# Patient Record
Sex: Female | Born: 1960
Health system: Southern US, Community
[De-identification: ages and names within clinical notes are randomized; demographics above are authoritative.]

## PROBLEM LIST (undated history)

## (undated) DIAGNOSIS — K5190 Ulcerative colitis, unspecified, without complications: Secondary | ICD-10-CM

## (undated) DIAGNOSIS — K219 Gastro-esophageal reflux disease without esophagitis: Secondary | ICD-10-CM

## (undated) DIAGNOSIS — N951 Menopausal and female climacteric states: Secondary | ICD-10-CM

## (undated) DIAGNOSIS — M1990 Unspecified osteoarthritis, unspecified site: Secondary | ICD-10-CM

## (undated) DIAGNOSIS — R011 Cardiac murmur, unspecified: Secondary | ICD-10-CM

## (undated) DIAGNOSIS — R350 Frequency of micturition: Secondary | ICD-10-CM

## (undated) DIAGNOSIS — J189 Pneumonia, unspecified organism: Secondary | ICD-10-CM

## (undated) DIAGNOSIS — I10 Essential (primary) hypertension: Secondary | ICD-10-CM

## (undated) DIAGNOSIS — F329 Major depressive disorder, single episode, unspecified: Secondary | ICD-10-CM

## (undated) DIAGNOSIS — N950 Postmenopausal bleeding: Secondary | ICD-10-CM

## (undated) DIAGNOSIS — R3915 Urgency of urination: Secondary | ICD-10-CM

## (undated) DIAGNOSIS — O24419 Gestational diabetes mellitus in pregnancy, unspecified control: Secondary | ICD-10-CM

## (undated) DIAGNOSIS — B070 Plantar wart: Secondary | ICD-10-CM

## (undated) DIAGNOSIS — R112 Nausea with vomiting, unspecified: Secondary | ICD-10-CM

## (undated) DIAGNOSIS — E669 Obesity, unspecified: Secondary | ICD-10-CM

## (undated) DIAGNOSIS — F32A Depression, unspecified: Secondary | ICD-10-CM

## (undated) DIAGNOSIS — M1710 Unilateral primary osteoarthritis, unspecified knee: Secondary | ICD-10-CM

## (undated) DIAGNOSIS — R040 Epistaxis: Secondary | ICD-10-CM

## (undated) DIAGNOSIS — E785 Hyperlipidemia, unspecified: Secondary | ICD-10-CM

## (undated) DIAGNOSIS — R42 Dizziness and giddiness: Secondary | ICD-10-CM

## (undated) DIAGNOSIS — M179 Osteoarthritis of knee, unspecified: Secondary | ICD-10-CM

## (undated) DIAGNOSIS — H7291 Unspecified perforation of tympanic membrane, right ear: Secondary | ICD-10-CM

## (undated) DIAGNOSIS — E113499 Type 2 diabetes mellitus with severe nonproliferative diabetic retinopathy without macular edema, unspecified eye: Secondary | ICD-10-CM

## (undated) DIAGNOSIS — D62 Acute posthemorrhagic anemia: Secondary | ICD-10-CM

## (undated) DIAGNOSIS — G459 Transient cerebral ischemic attack, unspecified: Secondary | ICD-10-CM

## (undated) DIAGNOSIS — Z9889 Other specified postprocedural states: Secondary | ICD-10-CM

## (undated) HISTORY — DX: Obesity, unspecified: E66.9

## (undated) HISTORY — PX: MOUTH SURGERY: SHX715

## (undated) HISTORY — DX: Unspecified osteoarthritis, unspecified site: M19.90

## (undated) HISTORY — DX: Menopausal and female climacteric states: N95.1

## (undated) HISTORY — PX: OTHER SURGICAL HISTORY: SHX169

## (undated) HISTORY — DX: Major depressive disorder, single episode, unspecified: F32.9

## (undated) HISTORY — PX: COLONOSCOPY: SHX174

## (undated) HISTORY — DX: Type 2 diabetes mellitus with severe nonproliferative diabetic retinopathy without macular edema, unspecified eye: E11.3499

## (undated) HISTORY — PX: BREAST BIOPSY: SHX20

## (undated) HISTORY — DX: Depression, unspecified: F32.A

## (undated) HISTORY — DX: Transient cerebral ischemic attack, unspecified: G45.9

## (undated) HISTORY — DX: Essential (primary) hypertension: I10

## (undated) HISTORY — DX: Gestational diabetes mellitus in pregnancy, unspecified control: O24.419

---
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
## 1998-07-16 ENCOUNTER — Encounter: Admission: RE | Admit: 1998-07-16 | Discharge: 1998-10-14 | Payer: Self-pay | Admitting: Neurology

## 2003-08-25 HISTORY — PX: KNEE ARTHROSCOPY: SUR90

## 2011-08-04 ENCOUNTER — Other Ambulatory Visit: Payer: Self-pay | Admitting: Orthopedic Surgery

## 2011-08-10 ENCOUNTER — Encounter: Payer: Self-pay | Admitting: Pulmonary Disease

## 2011-08-11 ENCOUNTER — Institutional Professional Consult (permissible substitution): Payer: Self-pay | Admitting: Pulmonary Disease

## 2011-09-21 ENCOUNTER — Telehealth: Payer: Self-pay | Admitting: Internal Medicine

## 2011-09-21 NOTE — Telephone Encounter (Signed)
Pt called in wanting to start care with [facility]; she would like to have her complete physical done on that first visit. She was advise that on the first visit is usually a history visit, but I told her I will pass her name and number will be pass to our nurse. The nurse will then call you, talk with you and see you can have your physical on the first day. She spoke understanding; she took the 10/13/2011 945 am visit. Pt number is [phone]. Thanks

## 2011-09-21 NOTE — Telephone Encounter (Signed)
Spoke with [name].  She states she is scheduled for a knee replacement, needs to have CPE done prior to for surgical clearance.  Reports other than migraine headaches that she sees [facility] for, no other major medical problems.  Okay for CPE on first visit.  She is aware she will have EKG and labs likely for surgical clearance.  She is agreeable

## 2011-09-21 NOTE — Telephone Encounter (Signed)
Attempted to contact pt, voicemail not set up yet, unable to leave a message

## 2011-10-13 ENCOUNTER — Encounter: Payer: Self-pay | Admitting: Internal Medicine

## 2011-10-13 ENCOUNTER — Ambulatory Visit (INDEPENDENT_AMBULATORY_CARE_PROVIDER_SITE_OTHER): Payer: 59 | Admitting: Internal Medicine

## 2011-10-13 VITALS — BP 136/86 | HR 93 | Temp 97.7°F | Resp 12 | Ht 67.0 in | Wt 208.1 lb

## 2011-10-13 DIAGNOSIS — M1990 Unspecified osteoarthritis, unspecified site: Secondary | ICD-10-CM | POA: Insufficient documentation

## 2011-10-13 DIAGNOSIS — F32A Depression, unspecified: Secondary | ICD-10-CM | POA: Insufficient documentation

## 2011-10-13 DIAGNOSIS — F329 Major depressive disorder, single episode, unspecified: Secondary | ICD-10-CM | POA: Insufficient documentation

## 2011-10-13 DIAGNOSIS — Z0000 Encounter for general adult medical examination without abnormal findings: Secondary | ICD-10-CM

## 2011-10-13 DIAGNOSIS — L509 Urticaria, unspecified: Secondary | ICD-10-CM | POA: Insufficient documentation

## 2011-10-13 DIAGNOSIS — M129 Arthropathy, unspecified: Secondary | ICD-10-CM

## 2011-10-13 DIAGNOSIS — K219 Gastro-esophageal reflux disease without esophagitis: Secondary | ICD-10-CM

## 2011-10-13 DIAGNOSIS — R6889 Other general symptoms and signs: Secondary | ICD-10-CM

## 2011-10-13 DIAGNOSIS — IMO0002 Reserved for concepts with insufficient information to code with codable children: Secondary | ICD-10-CM

## 2011-10-13 DIAGNOSIS — R51 Headache: Secondary | ICD-10-CM

## 2011-10-13 DIAGNOSIS — R011 Cardiac murmur, unspecified: Secondary | ICD-10-CM

## 2011-10-13 DIAGNOSIS — N951 Menopausal and female climacteric states: Secondary | ICD-10-CM | POA: Insufficient documentation

## 2011-10-13 DIAGNOSIS — R87619 Unspecified abnormal cytological findings in specimens from cervix uteri: Secondary | ICD-10-CM | POA: Insufficient documentation

## 2011-10-13 DIAGNOSIS — G43909 Migraine, unspecified, not intractable, without status migrainosus: Secondary | ICD-10-CM | POA: Insufficient documentation

## 2011-10-13 LAB — POCT URINALYSIS DIPSTICK
Glucose, UA: NEGATIVE
Leukocytes, UA: NEGATIVE
Nitrite, UA: NEGATIVE
Protein, UA: NEGATIVE
Spec Grav, UA: <=1.005
Urobilinogen, UA: NEGATIVE

## 2011-10-13 LAB — LIPID PANEL
Cholesterol: 192 mg/dL (ref 0–200)
Total CHOL/HDL Ratio: 3.3 Ratio
Triglycerides: 95 mg/dL (ref <150)
VLDL: 19 mg/dL (ref 0–40)

## 2011-10-13 LAB — COMPREHENSIVE METABOLIC PANEL
ALT: 12 U/L (ref 0–35)
AST: 16 U/L (ref 0–37)
CO2: 19 mEq/L (ref 19–32)
Calcium: 9.6 mg/dL (ref 8.4–10.5)
Chloride: 106 mEq/L (ref 96–112)
Creat: 1.08 mg/dL (ref 0.50–1.10)
Potassium: 4.4 mEq/L (ref 3.5–5.3)
Sodium: 139 mEq/L (ref 135–145)
Total Protein: 7.2 g/dL (ref 6.0–8.3)

## 2011-10-13 MED ORDER — AZITHROMYCIN 250 MG PO TABS: ORAL_TABLET | ORAL | Status: AC

## 2011-10-13 NOTE — Patient Instructions (Signed)
To have ECHO cardiogram done  Labs will be mailed to you  See me in early march

## 2011-10-13 NOTE — Progress Notes (Signed)
Subjective:    Patient ID: [name], female    DOB: 05-Aug-1961, 51 y.o.   MRN: [id number]  HPI New pt here for first visit.  PMH of migraine headaches, Advanced DJD of knees, Depression, remote abnormal pap treated with lazer, GERD, chronic urticaria and cardiac murmur.  She has not had any primary care in years.    She willl be having knee replacement surgery in early March.  Dr. Maureen Ralphs   She has been having pain for several days in her R ear  No drainage no fever  Was told by her headache doctor she had a heart murmur  No chest pain no Sob no palpitations.  Did no have ECHO but was told EKG was ok   Allergies  Allergen Reactions  . Codeine Nausea And Vomiting   Past Medical History  Diagnosis Date  . Migraine without aura   . Gestational diabetes   . Menopausal syndrome   . Obese   . Anemia   . Arthritis   . Depression   . Hypertension    Past Surgical History  Procedure Date  . Tonsillectomy 1979  . Knee arthroscopy 2005   History   Social History  . Marital Status: Single    Spouse Name: N/A    Number of Children: N/A  . Years of Education: N/A   Occupational History  . Not on file.   Social History Main Topics  . Smoking status: Never Smoker   . Smokeless tobacco: Not on file  . Alcohol Use: 4.2 oz/week    7 Glasses of wine per week  . Drug Use: No  . Sexually Active: Not Currently   Other Topics Concern  . Not on file   Social History Narrative  . No narrative on file   Family History  Problem Relation Age of Onset  . Heart disease Mother     mitral valve prolapse  . Lung cancer Father   . Thyroid disease Father   . Thyroid disease Sister   . Heart disease Maternal Aunt   . Heart disease Maternal Uncle   . Thyroid disease Paternal Aunt   . Heart disease Maternal Grandfather   . Heart disease Paternal Grandfather   . Heart disease Maternal Uncle   . Heart disease Maternal Uncle   . Heart disease Maternal Uncle   . Diabetes Maternal  Aunt    There is no problem list on file for this patient.  Current Outpatient Prescriptions on File Prior to Visit  Medication Sig Dispense Refill  . HYDROcodone-acetaminophen (NORCO) 10-325 MG per tablet 1-2 tablets BID PRN      . topiramate (TOPAMAX) 100 MG tablet Take 150 mg by mouth at bedtime.       Marland Kitchen venlafaxine (EFFEXOR-XR) 75 MG 24 hr capsule Take 225 mg by mouth daily.            Review of Systems See HPI    Objective:   Physical Exam Physical Exam  Nursing note and vitals reviewed.  Constitutional: She is oriented to person, place, and time. She appears well-developed and well-nourished.  HENT:  Head: Normocephalic and atraumatic.  L ear TM clear  R ear TM red swollen Cardiovascular: Normal rate and regular rhythm. Exam reveals no gallop and no friction rub.   Very soft 2/6 SEM LUSB No murmur heard.  Pulmonary/Chest: Breath sounds normal. She has no wheezes. She has no rales.  Neurological: She is alert and oriented to person, place, and time.  Skin: Skin is warm and dry.  Psychiatric: She has a normal mood and affect. Her behavior is normal.              Assessment & Plan:  1)  Cardiac murmur:  EKG  Bicuspid p wave V1   Other wise normal  Will get @-D echo 2)  R otitits media  Will give Z pak 3)  Elevated BP  Will recheck in 3 weeks.  SBP came down when I checked myself 4)  Advanced DJD   Pre-op approval pending  ECHO and labs 5)  Migraine headache:  Dr. Domingo Cocking on Topamax 6)  GERD 7)  Remote abnormal pap smear.  Dr. Posey Pronto 8)  Situational depression  See me in 2-3 weeks

## 2011-10-14 LAB — CBC WITH DIFFERENTIAL/PLATELET
Basophils Absolute: 0 10*3/uL (ref 0.0–0.1)
Lymphocytes Relative: 33 % (ref 12–46)
Lymphs Abs: 1.9 10*3/uL (ref 0.7–4.0)
MCV: 93.3 fL (ref 78.0–100.0)
Neutro Abs: 3.3 10*3/uL (ref 1.7–7.7)
Neutrophils Relative %: 57 % (ref 43–77)
Platelets: 303 10*3/uL (ref 150–400)
RBC: 4.51 MIL/uL (ref 3.87–5.11)
RDW: 13.1 % (ref 11.5–15.5)
WBC: 5.8 10*3/uL (ref 4.0–10.5)

## 2011-10-15 ENCOUNTER — Encounter: Payer: Self-pay | Admitting: Emergency Medicine

## 2011-10-15 NOTE — Progress Notes (Signed)
H&P performed 10/15/2011 Dictation # [id number]

## 2011-10-16 NOTE — H&P (Signed)
[name], [name]                ACCOUNT NO.:  [id number]  MEDICAL RECORD NO.:  [id number]  LOCATION:  [place]                           FACILITY:  [facility]  PHYSICIAN:  Gaynelle Arabian, M.D.    DATE OF BIRTH:  1960/09/12  DATE OF ADMISSION:  10/13/2011 DATE OF DISCHARGE:                             HISTORY & PHYSICAL   DATE OF SURGERY:  November 02, 2011.  ADMITTING DIAGNOSIS:  End-stage osteoarthritis of the left knee.  HISTORY OF PRESENT ILLNESS:  This is a 51 year old lady who has been having problems with her left knee for years, has had arthroscopy, viscosupplementation and has failed conservative management.  After discussion of treatments, benefits, risks, and options, the patient is now scheduled for total knee arthroplasty of the left knee.  Note that her medical doctor is Dr. Emi Belfast at [facility].  Her headache doctor is Dr. Domingo Cocking.  She plans on going home after surgery, but if doing poorly in therapy may request to go to a rehab facility.  We will see as the postoperative period goes on.  PAST MEDICAL HISTORY:  Drug allergy to CODEINE with nausea and vomiting and ONIONS with migraine headache.  CURRENT MEDICATIONS: 1. Effexor 225 mg daily. 2. Topamax 150 mg daily. 3. Omeprazole 20 mg daily. 4. Zyrtec 1 daily. 5. Aleve 2 daily, which she will stop 5 days before surgery. 6. Hydrocodone 5/325 1 daily p.r.n. 7. Relpax p.r.n. for migraines.  SERIOUS MEDICAL ILLNESSES:  Include: 1. Migraines. 2. Situational depression. 3. Reflux. 4. Allergies.  PREVIOUS SURGERIES:  Include: 1. Knee arthroscopy. 2. Tonsillectomy. 3. Excision of an osteophyte from anterior skull from trauma as a     child.  FAMILY HISTORY:  Positive for lung cancer, heart attack, coronary artery disease, anxiety, and thyroid disease.  SOCIAL HISTORY:  The patient is divorced.  She works as a Education officer, community in a counseling group.  She does not smoke and drinks several glasses of wine  per week.  She does live alone and is planning on going home with her mother for the first 2 weeks after surgery and then with her daughter thereafter.  REVIEW OF SYSTEMS:  CENTRAL NERVOUS SYSTEM:  Positive for fatigue and insomnia, also for migraine headaches and some hearing loss in the right ear.  PULMONARY:  Negative for shortness of breath, PND, and orthopnea. CARDIOVASCULAR:  Positive for a murmur.  She is scheduled for an echo on October 20, 2011.  Negative for chest pains or palpitation.  GI: Negative for ulcers or hepatitis, positive for reflux.  GU:  Negative for urinary tract difficulties other than urinary frequency. MUSCULOSKELETAL:  Positives in HPI.  Note that the patient also has intermittent hives for which she takes Zyrtec.  PHYSICAL EXAM:  VITAL SIGNS:  BP 140/88, pulse 72 and regular, respirations 14. HEENT:  HEAD normocephalic.  Nose patent, ears patent.  Pupils equal, round, react to light.  Throat without injection. NECK:  Supple without adenopathy.  Carotids 2+ without bruit. CHEST:  Clear to auscultation.  No rales or rhonchi.  Respirations 14. HEART:  Regular rate and rhythm at 72 beats per minute without murmur. ABDOMEN:  Soft with active  bowel sounds.  No masses or organomegaly. NEUROLOGIC:  Patient alert and oriented to time, place, and person. Cranial nerves II through XII grossly intact.  EXTREMITIES:  Show the left knee with a 5-degree flexion contracture, further flexion to 110 degrees with pain.  Dorsalis pedis, posterior tibialis pulses are 2+. She does have a varus deformity.  IMPRESSION:  End-stage osteoarthritis, left knee.  PLAN:  Total knee arthroplasty, left knee.     [name], P.A.   ______________________________ Gaynelle Arabian, M.D.    SJC/MEDQ  D:  10/15/2011  T:  10/16/2011  Job:  [id number]

## 2011-10-20 ENCOUNTER — Other Ambulatory Visit (HOSPITAL_COMMUNITY): Payer: Self-pay | Admitting: Radiology

## 2011-10-20 ENCOUNTER — Ambulatory Visit (HOSPITAL_COMMUNITY): Payer: 59 | Attending: Cardiovascular Disease

## 2011-10-20 ENCOUNTER — Other Ambulatory Visit: Payer: Self-pay

## 2011-10-20 DIAGNOSIS — I079 Rheumatic tricuspid valve disease, unspecified: Secondary | ICD-10-CM | POA: Insufficient documentation

## 2011-10-20 DIAGNOSIS — R51 Headache: Secondary | ICD-10-CM | POA: Insufficient documentation

## 2011-10-20 DIAGNOSIS — R011 Cardiac murmur, unspecified: Secondary | ICD-10-CM | POA: Insufficient documentation

## 2011-10-20 DIAGNOSIS — I059 Rheumatic mitral valve disease, unspecified: Secondary | ICD-10-CM | POA: Insufficient documentation

## 2011-10-22 ENCOUNTER — Encounter (HOSPITAL_COMMUNITY): Payer: Self-pay | Admitting: Pharmacy Technician

## 2011-10-23 ENCOUNTER — Encounter (HOSPITAL_COMMUNITY): Payer: Self-pay

## 2011-10-23 ENCOUNTER — Ambulatory Visit (HOSPITAL_COMMUNITY)
Admission: RE | Admit: 2011-10-23 | Discharge: 2011-10-23 | Disposition: A | Discharge status: Home / Self Care | Payer: 59 | Source: Ambulatory Visit | Attending: Orthopedic Surgery | Admitting: Orthopedic Surgery

## 2011-10-23 ENCOUNTER — Encounter (HOSPITAL_COMMUNITY)
Admission: RE | Admit: 2011-10-23 | Discharge: 2011-10-23 | Disposition: A | Discharge status: Home / Self Care | Payer: 59 | Source: Ambulatory Visit | Attending: Orthopedic Surgery | Admitting: Orthopedic Surgery

## 2011-10-23 DIAGNOSIS — Z01818 Encounter for other preprocedural examination: Secondary | ICD-10-CM | POA: Insufficient documentation

## 2011-10-23 DIAGNOSIS — Z01812 Encounter for preprocedural laboratory examination: Secondary | ICD-10-CM | POA: Insufficient documentation

## 2011-10-23 HISTORY — DX: Gastro-esophageal reflux disease without esophagitis: K21.9

## 2011-10-23 HISTORY — DX: Plantar wart: B07.0

## 2011-10-23 HISTORY — DX: Other specified postprocedural states: R11.2

## 2011-10-23 HISTORY — DX: Cardiac murmur, unspecified: R01.1

## 2011-10-23 HISTORY — DX: Other specified postprocedural states: Z98.890

## 2011-10-23 LAB — COMPREHENSIVE METABOLIC PANEL
ALT: 14 U/L (ref 0–35)
AST: 16 U/L (ref 0–37)
Albumin: 4.3 g/dL (ref 3.5–5.2)
Alkaline Phosphatase: 74 U/L (ref 39–117)
CO2: 26 mEq/L (ref 19–32)
Chloride: 105 mEq/L (ref 96–112)
Potassium: 4.1 mEq/L (ref 3.5–5.1)
Total Bilirubin: 0.3 mg/dL (ref 0.3–1.2)

## 2011-10-23 LAB — SURGICAL PCR SCREEN: Staphylococcus aureus: POSITIVE — AB

## 2011-10-23 LAB — CBC
Platelets: 294 10*3/uL (ref 150–400)
RBC: 4.64 MIL/uL (ref 3.87–5.11)
RDW: 13.2 % (ref 11.5–15.5)
WBC: 4.3 10*3/uL (ref 4.0–10.5)

## 2011-10-23 LAB — URINALYSIS, ROUTINE W REFLEX MICROSCOPIC
Bilirubin Urine: NEGATIVE
Glucose, UA: NEGATIVE mg/dL
Hgb urine dipstick: NEGATIVE
Nitrite: NEGATIVE
Specific Gravity, Urine: 1.022 (ref 1.005–1.030)
pH: 6 (ref 5.0–8.0)

## 2011-10-23 LAB — APTT: aPTT: 30 seconds (ref 24–37)

## 2011-10-23 NOTE — Pre-Procedure Instructions (Signed)
Abnormal labs called to Dr. Anne Fu office, spoke to [place] at his office who sent him a message via email .

## 2011-10-23 NOTE — Patient Instructions (Addendum)
[place]  10/23/2011   Your procedure is scheduled on:  11/02/11   Report to [place] at [phone] AM.  Call this number if you have problems the morning of surgery: [phone]   Remember:   Do not eat food:After Midnight.  May have clear liquids:until Midnight .  Clear liquids include soda, tea, black coffee, apple or grape juice, broth.  Take these medicines the morning of surgery with A SIP OF WATER: Zyrtec, Effexor, Prilosec    Do not wear jewelry, make-up or nail polish.  Do not wear lotions, powders, or perfumes.   Do not shave 48 hours prior to surgery.  Do not bring valuables to the hospital.  Contacts, dentures or bridgework may not be worn into surgery.  Leave suitcase in the car. After surgery it may be brought to your room.  For patients admitted to the hospital, checkout time is 11:00 AM the day of discharge.     Special Instructions: CHG Shower Use Special Wash: 1/2 bottle night before surgery and 1/2 bottle morning of surgery. shower chin to toes  With CHG.  Wash face and private parts with regular soap.    Please read over the following fact sheets that you were given: MRSA Information, Blood Transfusion Fact Sheet, Incentive Spirometry, Apnea Info, coughing and deep breathing exercises, leg exercises

## 2011-10-27 ENCOUNTER — Encounter: Payer: Self-pay | Admitting: Internal Medicine

## 2011-10-27 ENCOUNTER — Ambulatory Visit (INDEPENDENT_AMBULATORY_CARE_PROVIDER_SITE_OTHER): Payer: 59 | Admitting: Internal Medicine

## 2011-10-27 VITALS — BP 128/84 | HR 89 | Temp 97.2°F | Ht 67.0 in | Wt 209.1 lb

## 2011-10-27 DIAGNOSIS — IMO0001 Reserved for inherently not codable concepts without codable children: Secondary | ICD-10-CM

## 2011-10-27 DIAGNOSIS — M1990 Unspecified osteoarthritis, unspecified site: Secondary | ICD-10-CM

## 2011-10-27 DIAGNOSIS — R011 Cardiac murmur, unspecified: Secondary | ICD-10-CM

## 2011-10-27 DIAGNOSIS — R030 Elevated blood-pressure reading, without diagnosis of hypertension: Secondary | ICD-10-CM

## 2011-10-27 NOTE — Progress Notes (Signed)
Subjective:    Patient ID: [name], female    DOB: [birth year], 51 y.o.   MRN: [id number]  HPI  Blossom is here to recheck her BP prior to surgery.  Doing fine.  No headache or chest pain.  See ECHO report.    Allergies  Allergen Reactions  . Codeine Nausea And Vomiting  . Onion Other (See Comments)    Headache    Past Medical History  Diagnosis Date  . Migraine without aura   . Menopausal syndrome   . Obese   . Arthritis   . Depression   . PONV (postoperative nausea and vomiting)   . Heart murmur     echo 10/20/2011  . GERD (gastroesophageal reflux disease)   . Plantar warts    Past Surgical History  Procedure Date  . Tonsillectomy 1979  . Knee arthroscopy 2005  . 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   History   Social History  . Marital Status: Single    Spouse Name: N/A    Number of Children: N/A  . Years of Education: N/A   Occupational History  . Not on file.   Social History Main Topics  . Smoking status: Never Smoker   . Smokeless tobacco: Not on file  . Alcohol Use: 4.2 oz/week    7 Glasses of wine per week     occassionally  . Drug Use: No  . Sexually Active: Not Currently   Other Topics Concern  . Not on file   Social History Narrative  . No narrative on file   Family History  Problem Relation Age of Onset  . Heart disease Mother     mitral valve prolapse  . Lung cancer Father   . Thyroid disease Father   . Thyroid disease Sister   . Heart disease Maternal Aunt   . Heart disease Maternal Uncle   . Thyroid disease Paternal Aunt   . Heart disease Maternal Grandfather   . Heart disease Paternal Grandfather   . Heart disease Maternal Uncle   . Heart disease Maternal Uncle   . Heart disease Maternal Uncle   . Diabetes Maternal Aunt    Patient Active Problem List  Diagnoses  . Arthritis  . Migraine without aura  . Depression  . Menopausal syndrome  . Abnormal Pap smear  . Cardiac murmur  . GERD  (gastroesophageal reflux disease)  . Urticaria   Current Outpatient Prescriptions on File Prior to Visit  Medication Sig Dispense Refill  . cetirizine (ZYRTEC) 10 MG tablet Take 10 mg by mouth daily.      . diphenhydramine-acetaminophen (TYLENOL PM) 25-500 MG TABS Take 2 tablets by mouth at bedtime.      Marland Kitchen eletriptan (RELPAX) 40 MG tablet One tablet by mouth as needed for migraine headache.  If the headache improves and then returns, dose may be repeated after 2 hours have elapsed since first dose (do not exceed 80 mg per day). may repeat in 2 hours if necessary. Headache       . HYDROcodone-acetaminophen (NORCO) 10-325 MG per tablet Take 1-2 tablets by mouth every 6 (six) hours as needed. Pain       . Melatonin 10 MG TABS Take 10 mg by mouth at bedtime.      . naproxen sodium (ANAPROX) 220 MG tablet Take 440 mg by mouth 2 (two) times daily as needed. Pain       . omeprazole (PRILOSEC OTC) 20 MG tablet  Take 20 mg by mouth daily.      Marland Kitchen topiramate (TOPAMAX) 100 MG tablet Take 150 mg by mouth at bedtime.       Marland Kitchen venlafaxine (EFFEXOR-XR) 75 MG 24 hr capsule Take 225 mg by mouth daily after breakfast.           Review of Systems see HPI   Objective:   Physical Exam  Physical Exam  Nursing note and vitals reviewed.  BP my exam WNL Constitutional: She is oriented to person, place, and time. She appears well-developed and well-nourished.  HENT:  Head: Normocephalic and atraumatic.  Cardiovascular: Normal rate and regular rhythm. Exam reveals no gallop and no friction rub.  No murmur heard.  Pulmonary/Chest: Breath sounds normal. She has no wheezes. She has no rales.  Neurological: She is alert and oriented to person, place, and time.  Skin: Skin is warm and dry.  Psychiatric: She has a normal mood and affect. Her behavior is normal.        Assessment & Plan:  1)  Elevated BP  Now normal 2)  Advance DJD  TKR next week 3)  History of Cardiac murmur.  ECHO  Valves look normal  Good  EF

## 2011-10-27 NOTE — Patient Instructions (Signed)
Office visit with me after surgery

## 2011-10-30 NOTE — Pre-Procedure Instructions (Signed)
Received call from Violet Baldy at Dr. Anne Fu office on 10-27-2011 for No action needed on abnormal labs per Arlee Muslim, PA

## 2011-11-02 ENCOUNTER — Ambulatory Visit (HOSPITAL_COMMUNITY): Payer: 59 | Admitting: Anesthesiology

## 2011-11-02 ENCOUNTER — Encounter (HOSPITAL_COMMUNITY)
Admission: RE | Disposition: A | Discharge status: Skilled Nursing Facility | Payer: Self-pay | Source: Ambulatory Visit | Attending: Orthopedic Surgery

## 2011-11-02 ENCOUNTER — Encounter (HOSPITAL_COMMUNITY): Payer: Self-pay

## 2011-11-02 ENCOUNTER — Inpatient Hospital Stay (HOSPITAL_COMMUNITY)
Admission: RE | Admit: 2011-11-02 | Discharge: 2011-11-05 | DRG: 470 | Disposition: A | Discharge status: Skilled Nursing Facility | Payer: 59 | Source: Ambulatory Visit | Attending: Orthopedic Surgery | Admitting: Orthopedic Surgery

## 2011-11-02 ENCOUNTER — Encounter (HOSPITAL_COMMUNITY): Payer: Self-pay | Admitting: Anesthesiology

## 2011-11-02 DIAGNOSIS — E871 Hypo-osmolality and hyponatremia: Secondary | ICD-10-CM | POA: Diagnosis not present

## 2011-11-02 DIAGNOSIS — M1710 Unilateral primary osteoarthritis, unspecified knee: Principal | ICD-10-CM | POA: Diagnosis present

## 2011-11-02 DIAGNOSIS — Z96659 Presence of unspecified artificial knee joint: Secondary | ICD-10-CM

## 2011-11-02 DIAGNOSIS — D62 Acute posthemorrhagic anemia: Secondary | ICD-10-CM | POA: Diagnosis not present

## 2011-11-02 DIAGNOSIS — E876 Hypokalemia: Secondary | ICD-10-CM | POA: Diagnosis not present

## 2011-11-02 DIAGNOSIS — E669 Obesity, unspecified: Secondary | ICD-10-CM | POA: Diagnosis present

## 2011-11-02 HISTORY — DX: Acute posthemorrhagic anemia: D62

## 2011-11-02 HISTORY — PX: TOTAL KNEE ARTHROPLASTY: SHX125

## 2011-11-02 LAB — TYPE AND SCREEN
ABO/RH(D): AB NEG
Antibody Screen: NEGATIVE

## 2011-11-02 LAB — HCG, SERUM, QUALITATIVE: Preg, Serum: NEGATIVE

## 2011-11-02 SURGERY — ARTHROPLASTY, KNEE, TOTAL
Anesthesia: General | Site: Knee | Laterality: Left | Wound class: Clean

## 2011-11-02 MED ORDER — ONDANSETRON HCL 4 MG/2ML IJ SOLN: 4.0000 mg | Freq: Four times a day (QID) | INTRAMUSCULAR | Status: DC | PRN

## 2011-11-02 MED ORDER — ACETAMINOPHEN 10 MG/ML IV SOLN
INTRAVENOUS | Status: AC
  Filled 2011-11-02: qty 100

## 2011-11-02 MED ORDER — PROPOFOL 10 MG/ML IV BOLUS
INTRAVENOUS | Status: DC | PRN
  Administered 2011-11-02: 180 mg via INTRAVENOUS

## 2011-11-02 MED ORDER — TEMAZEPAM 15 MG PO CAPS
15.0000 mg | ORAL_CAPSULE | Freq: Every evening | ORAL | Status: DC | PRN
  Administered 2011-11-03: 15 mg via ORAL
  Filled 2011-11-02: qty 2

## 2011-11-02 MED ORDER — HYDROMORPHONE 0.3 MG/ML IV SOLN
INTRAVENOUS | Status: AC
  Administered 2011-11-02: 2.59 mg via INTRAVENOUS
  Filled 2011-11-02: qty 25

## 2011-11-02 MED ORDER — HYDROMORPHONE HCL 2 MG PO TABS
2.0000 mg | ORAL_TABLET | ORAL | Status: DC | PRN
  Administered 2011-11-03 (×2): 2 mg via ORAL
  Administered 2011-11-03 (×2): 4 mg via ORAL
  Administered 2011-11-03: 2 mg via ORAL
  Administered 2011-11-04 (×2): 4 mg via ORAL
  Administered 2011-11-04: 2 mg via ORAL
  Administered 2011-11-04 (×2): 4 mg via ORAL
  Administered 2011-11-05 (×3): 2 mg via ORAL
  Filled 2011-11-02 (×3): qty 2
  Filled 2011-11-02 (×2): qty 1
  Filled 2011-11-02 (×3): qty 2
  Filled 2011-11-02: qty 1
  Filled 2011-11-02: qty 2
  Filled 2011-11-02 (×3): qty 1

## 2011-11-02 MED ORDER — LACTATED RINGERS IV SOLN
INTRAVENOUS | Status: DC | PRN
  Administered 2011-11-02 (×2): via INTRAVENOUS

## 2011-11-02 MED ORDER — CHLORHEXIDINE GLUCONATE 4 % EX LIQD: 60.0000 mL | Freq: Once | CUTANEOUS | Status: DC

## 2011-11-02 MED ORDER — DEXTROSE-NACL 5-0.45 % IV SOLN
INTRAVENOUS | Status: DC
  Administered 2011-11-02 – 2011-11-03 (×3): via INTRAVENOUS

## 2011-11-02 MED ORDER — HYDROMORPHONE HCL PF 1 MG/ML IJ SOLN
0.2500 mg | INTRAMUSCULAR | Status: DC | PRN
  Administered 2011-11-02: 0.5 mg via INTRAVENOUS

## 2011-11-02 MED ORDER — BUPIVACAINE ON-Q PAIN PUMP (FOR ORDER SET NO CHG)
INJECTION | Status: DC
  Filled 2011-11-02: qty 1

## 2011-11-02 MED ORDER — ONDANSETRON HCL 4 MG/2ML IJ SOLN
INTRAMUSCULAR | Status: DC | PRN
  Administered 2011-11-02: 4 mg via INTRAVENOUS

## 2011-11-02 MED ORDER — PANTOPRAZOLE SODIUM 40 MG PO TBEC
40.0000 mg | DELAYED_RELEASE_TABLET | Freq: Every day | ORAL | Status: DC
  Administered 2011-11-02: 40 mg via ORAL
  Filled 2011-11-02: qty 1

## 2011-11-02 MED ORDER — SODIUM CHLORIDE 0.9 % IV SOLN: INTRAVENOUS | Status: DC

## 2011-11-02 MED ORDER — BUPIVACAINE 0.25 % ON-Q PUMP SINGLE CATH 300ML
300.0000 mL | INJECTION | Status: DC
  Filled 2011-11-02: qty 300

## 2011-11-02 MED ORDER — ACETAMINOPHEN 650 MG RE SUPP: 650.0000 mg | Freq: Four times a day (QID) | RECTAL | Status: DC | PRN

## 2011-11-02 MED ORDER — DIPHENHYDRAMINE HCL 50 MG/ML IJ SOLN: 12.5000 mg | Freq: Four times a day (QID) | INTRAMUSCULAR | Status: DC | PRN

## 2011-11-02 MED ORDER — HYDROMORPHONE HCL PF 1 MG/ML IJ SOLN
0.2500 mg | INTRAMUSCULAR | Status: DC | PRN
Start: 2011-11-02 — End: 2011-11-02
  Administered 2011-11-02 (×4): 0.5 mg via INTRAVENOUS

## 2011-11-02 MED ORDER — FENTANYL CITRATE 0.05 MG/ML IJ SOLN
INTRAMUSCULAR | Status: DC | PRN
  Administered 2011-11-02: 100 ug via INTRAVENOUS
  Administered 2011-11-02 (×5): 50 ug via INTRAVENOUS
  Administered 2011-11-02: 100 ug via INTRAVENOUS
  Administered 2011-11-02: 50 ug via INTRAVENOUS

## 2011-11-02 MED ORDER — POLYETHYLENE GLYCOL 3350 17 G PO PACK: 17.0000 g | PACK | Freq: Every day | ORAL | Status: DC | PRN

## 2011-11-02 MED ORDER — ACETAMINOPHEN 10 MG/ML IV SOLN
1000.0000 mg | Freq: Four times a day (QID) | INTRAVENOUS | Status: AC
  Administered 2011-11-02 – 2011-11-03 (×4): 1000 mg via INTRAVENOUS
  Filled 2011-11-02 (×4): qty 100

## 2011-11-02 MED ORDER — DIPHENHYDRAMINE HCL 12.5 MG/5ML PO ELIX: 12.5000 mg | ORAL_SOLUTION | ORAL | Status: DC | PRN

## 2011-11-02 MED ORDER — HYDROMORPHONE HCL PF 1 MG/ML IJ SOLN
INTRAMUSCULAR | Status: AC
  Filled 2011-11-02: qty 1

## 2011-11-02 MED ORDER — TRAMADOL HCL 50 MG PO TABS
50.0000 mg | ORAL_TABLET | Freq: Four times a day (QID) | ORAL | Status: DC | PRN
  Administered 2011-11-02: 50 mg via ORAL
  Administered 2011-11-03: 100 mg via ORAL
  Filled 2011-11-02: qty 2
  Filled 2011-11-02: qty 1

## 2011-11-02 MED ORDER — LACTATED RINGERS IV SOLN: INTRAVENOUS | Status: DC

## 2011-11-02 MED ORDER — BUPIVACAINE 0.25 % ON-Q PUMP SINGLE CATH 300ML
INJECTION | Status: AC
  Filled 2011-11-02: qty 300

## 2011-11-02 MED ORDER — SCOPOLAMINE 1 MG/3DAYS TD PT72
MEDICATED_PATCH | TRANSDERMAL | Status: AC
  Filled 2011-11-02: qty 1

## 2011-11-02 MED ORDER — NALOXONE HCL 0.4 MG/ML IJ SOLN: 0.4000 mg | INTRAMUSCULAR | Status: DC | PRN

## 2011-11-02 MED ORDER — METHOCARBAMOL 500 MG PO TABS
500.0000 mg | ORAL_TABLET | Freq: Four times a day (QID) | ORAL | Status: DC | PRN
  Administered 2011-11-02 – 2011-11-04 (×5): 500 mg via ORAL
  Filled 2011-11-02 (×5): qty 1

## 2011-11-02 MED ORDER — METOCLOPRAMIDE HCL 10 MG PO TABS
5.0000 mg | ORAL_TABLET | Freq: Three times a day (TID) | ORAL | Status: DC | PRN
  Administered 2011-11-04: 10 mg via ORAL
  Filled 2011-11-02: qty 1

## 2011-11-02 MED ORDER — MIDAZOLAM HCL 5 MG/5ML IJ SOLN
INTRAMUSCULAR | Status: DC | PRN
  Administered 2011-11-02: 2 mg via INTRAVENOUS

## 2011-11-02 MED ORDER — SCOPOLAMINE 1 MG/3DAYS TD PT72
1.0000 | MEDICATED_PATCH | TRANSDERMAL | Status: DC
  Administered 2011-11-02: 1.5 mg via TRANSDERMAL
  Filled 2011-11-02: qty 1

## 2011-11-02 MED ORDER — ONDANSETRON HCL 4 MG/2ML IJ SOLN
4.0000 mg | Freq: Four times a day (QID) | INTRAMUSCULAR | Status: DC | PRN
  Administered 2011-11-03 – 2011-11-04 (×2): 4 mg via INTRAVENOUS
  Filled 2011-11-02 (×3): qty 2

## 2011-11-02 MED ORDER — ONDANSETRON HCL 4 MG PO TABS
4.0000 mg | ORAL_TABLET | Freq: Four times a day (QID) | ORAL | Status: DC | PRN
  Administered 2011-11-05 (×2): 4 mg via ORAL
  Filled 2011-11-02 (×2): qty 1

## 2011-11-02 MED ORDER — FLEET ENEMA 7-19 GM/118ML RE ENEM: 1.0000 | ENEMA | Freq: Once | RECTAL | Status: AC | PRN

## 2011-11-02 MED ORDER — DIPHENHYDRAMINE HCL 12.5 MG/5ML PO ELIX
12.5000 mg | ORAL_SOLUTION | Freq: Four times a day (QID) | ORAL | Status: DC | PRN
  Filled 2011-11-02: qty 5

## 2011-11-02 MED ORDER — BUPIVACAINE 0.25 % ON-Q PUMP SINGLE CATH 300ML
INJECTION | Status: DC | PRN
  Administered 2011-11-02: 300 mL

## 2011-11-02 MED ORDER — CEFAZOLIN SODIUM-DEXTROSE 2-3 GM-% IV SOLR
2.0000 g | INTRAVENOUS | Status: AC
  Administered 2011-11-02: 2 g via INTRAVENOUS

## 2011-11-02 MED ORDER — PHENOL 1.4 % MT LIQD
1.0000 | OROMUCOSAL | Status: DC | PRN
  Filled 2011-11-02: qty 177

## 2011-11-02 MED ORDER — LORATADINE 10 MG PO TABS
10.0000 mg | ORAL_TABLET | Freq: Every day | ORAL | Status: DC
  Administered 2011-11-02 – 2011-11-05 (×5): 10 mg via ORAL
  Filled 2011-11-02 (×6): qty 1

## 2011-11-02 MED ORDER — CEFAZOLIN SODIUM 1-5 GM-% IV SOLN
INTRAVENOUS | Status: AC
  Filled 2011-11-02: qty 100

## 2011-11-02 MED ORDER — CEFAZOLIN SODIUM 1-5 GM-% IV SOLN
1.0000 g | Freq: Four times a day (QID) | INTRAVENOUS | Status: AC
  Administered 2011-11-02 – 2011-11-03 (×3): 1 g via INTRAVENOUS
  Filled 2011-11-02 (×3): qty 50

## 2011-11-02 MED ORDER — HYDROMORPHONE 0.3 MG/ML IV SOLN
INTRAVENOUS | Status: DC
  Administered 2011-11-02: 1.79 mg via INTRAVENOUS
  Administered 2011-11-02: 3.19 mg via INTRAVENOUS
  Administered 2011-11-02: 12:00:00 via INTRAVENOUS
  Administered 2011-11-02: 0.599 mg via INTRAVENOUS
  Administered 2011-11-03: 0.3 mg via INTRAVENOUS
  Administered 2011-11-03: 2.79 mg via INTRAVENOUS

## 2011-11-02 MED ORDER — ELETRIPTAN HYDROBROMIDE 40 MG PO TABS
40.0000 mg | ORAL_TABLET | ORAL | Status: DC | PRN
  Filled 2011-11-02: qty 1

## 2011-11-02 MED ORDER — METHOCARBAMOL 100 MG/ML IJ SOLN
500.0000 mg | Freq: Four times a day (QID) | INTRAMUSCULAR | Status: DC | PRN
  Administered 2011-11-02 – 2011-11-04 (×3): 500 mg via INTRAVENOUS
  Filled 2011-11-02 (×4): qty 5

## 2011-11-02 MED ORDER — MENTHOL 3 MG MT LOZG
1.0000 | LOZENGE | OROMUCOSAL | Status: DC | PRN
  Filled 2011-11-02: qty 9

## 2011-11-02 MED ORDER — METOCLOPRAMIDE HCL 5 MG/ML IJ SOLN
5.0000 mg | Freq: Three times a day (TID) | INTRAMUSCULAR | Status: DC | PRN
  Administered 2011-11-04: 10 mg via INTRAVENOUS
  Filled 2011-11-02: qty 2

## 2011-11-02 MED ORDER — DOCUSATE SODIUM 100 MG PO CAPS
100.0000 mg | ORAL_CAPSULE | Freq: Two times a day (BID) | ORAL | Status: DC
  Administered 2011-11-02 – 2011-11-05 (×7): 100 mg via ORAL
  Filled 2011-11-02 (×6): qty 1

## 2011-11-02 MED ORDER — BISACODYL 10 MG RE SUPP: 10.0000 mg | Freq: Every day | RECTAL | Status: DC | PRN

## 2011-11-02 MED ORDER — OMEPRAZOLE MAGNESIUM 20 MG PO TBEC: 20.0000 mg | DELAYED_RELEASE_TABLET | Freq: Every day | ORAL | Status: DC

## 2011-11-02 MED ORDER — VENLAFAXINE HCL ER 75 MG PO CP24
225.0000 mg | ORAL_CAPSULE | Freq: Every day | ORAL | Status: DC
  Administered 2011-11-03 – 2011-11-05 (×3): 225 mg via ORAL
  Filled 2011-11-02 (×4): qty 1

## 2011-11-02 MED ORDER — SODIUM CHLORIDE 0.9 % IR SOLN
Status: DC | PRN
  Administered 2011-11-02: 1000 mL

## 2011-11-02 MED ORDER — ACETAMINOPHEN 10 MG/ML IV SOLN
1000.0000 mg | Freq: Once | INTRAVENOUS | Status: DC
  Filled 2011-11-02: qty 100

## 2011-11-02 MED ORDER — TOPIRAMATE 25 MG PO TABS
150.0000 mg | ORAL_TABLET | Freq: Every day | ORAL | Status: DC
  Administered 2011-11-02 – 2011-11-04 (×3): 150 mg via ORAL
  Filled 2011-11-02 (×4): qty 2

## 2011-11-02 MED ORDER — ACETAMINOPHEN 325 MG PO TABS: 650.0000 mg | ORAL_TABLET | Freq: Four times a day (QID) | ORAL | Status: DC | PRN

## 2011-11-02 MED ORDER — HYDROMORPHONE HCL PF 1 MG/ML IJ SOLN
INTRAMUSCULAR | Status: AC
  Administered 2011-11-03: 1 mg via INTRAVENOUS
  Filled 2011-11-02: qty 1

## 2011-11-02 MED ORDER — SUCCINYLCHOLINE CHLORIDE 20 MG/ML IJ SOLN
INTRAMUSCULAR | Status: DC | PRN
  Administered 2011-11-02: 100 mg via INTRAVENOUS

## 2011-11-02 MED ORDER — HYDROMORPHONE 0.3 MG/ML IV SOLN
INTRAVENOUS | Status: AC
  Administered 2011-11-02: 3.19 mg via INTRAVENOUS
  Filled 2011-11-02: qty 25

## 2011-11-02 MED ORDER — DEXAMETHASONE SODIUM PHOSPHATE 10 MG/ML IJ SOLN
10.0000 mg | Freq: Once | INTRAMUSCULAR | Status: AC
  Administered 2011-11-02: 10 mg via INTRAVENOUS

## 2011-11-02 MED ORDER — SODIUM CHLORIDE 0.9 % IJ SOLN: 9.0000 mL | INTRAMUSCULAR | Status: DC | PRN

## 2011-11-02 MED ORDER — RIVAROXABAN 10 MG PO TABS
10.0000 mg | ORAL_TABLET | Freq: Every day | ORAL | Status: DC
  Administered 2011-11-03 – 2011-11-05 (×3): 10 mg via ORAL
  Filled 2011-11-02 (×4): qty 1

## 2011-11-02 SURGICAL SUPPLY — 52 items
BAG SPEC THK2 15X12 ZIP CLS (MISCELLANEOUS) ×1
BAG ZIPLOCK 12X15 (MISCELLANEOUS) ×2 IMPLANT
BANDAGE ELASTIC 6 VELCRO ST LF (GAUZE/BANDAGES/DRESSINGS) ×2 IMPLANT
BANDAGE ESMARK 6X9 LF (GAUZE/BANDAGES/DRESSINGS) ×1 IMPLANT
BLADE SAG 18X100X1.27 (BLADE) ×2 IMPLANT
BLADE SAW SGTL 11.0X1.19X90.0M (BLADE) ×2 IMPLANT
BNDG CMPR 9X6 STRL LF SNTH (GAUZE/BANDAGES/DRESSINGS) ×1
BNDG ESMARK 6X9 LF (GAUZE/BANDAGES/DRESSINGS) ×2
BOWL SMART MIX CTS (DISPOSABLE) ×2 IMPLANT
CATH KIT ON-Q SILVERSOAK 5 (CATHETERS) ×1 IMPLANT
CATH KIT ON-Q SILVERSOAK 5IN (CATHETERS) ×2 IMPLANT
CEMENT HV SMART SET (Cement) ×4 IMPLANT
CLOTH BEACON ORANGE TIMEOUT ST (SAFETY) ×2 IMPLANT
CUFF TOURN SGL QUICK 34 (TOURNIQUET CUFF) ×2
CUFF TRNQT CYL 34X4X40X1 (TOURNIQUET CUFF) ×1 IMPLANT
DRAPE EXTREMITY T 121X128X90 (DRAPE) ×2 IMPLANT
DRAPE POUCH INSTRU U-SHP 10X18 (DRAPES) ×2 IMPLANT
DRAPE U-SHAPE 47X51 STRL (DRAPES) ×2 IMPLANT
DRSG ADAPTIC 3X8 NADH LF (GAUZE/BANDAGES/DRESSINGS) ×2 IMPLANT
DURAPREP 26ML APPLICATOR (WOUND CARE) ×2 IMPLANT
ELECT REM PT RETURN 9FT ADLT (ELECTROSURGICAL) ×2
ELECTRODE REM PT RTRN 9FT ADLT (ELECTROSURGICAL) ×1 IMPLANT
EVACUATOR 1/8 PVC DRAIN (DRAIN) ×2 IMPLANT
FACESHIELD LNG OPTICON STERILE (SAFETY) ×10 IMPLANT
GLOVE BIO SURGEON STRL SZ7.5 (GLOVE) ×2 IMPLANT
GLOVE BIO SURGEON STRL SZ8 (GLOVE) ×2 IMPLANT
GLOVE BIOGEL PI IND STRL 8 (GLOVE) ×2 IMPLANT
GLOVE BIOGEL PI INDICATOR 8 (GLOVE) ×2
GOWN STRL NON-REIN LRG LVL3 (GOWN DISPOSABLE) ×2 IMPLANT
GOWN STRL REIN XL XLG (GOWN DISPOSABLE) ×2 IMPLANT
HANDPIECE INTERPULSE COAX TIP (DISPOSABLE) ×2
IMMOBILIZER KNEE 20 (SOFTGOODS) ×2
IMMOBILIZER KNEE 20 THIGH 36 (SOFTGOODS) ×1 IMPLANT
KIT BASIN OR (CUSTOM PROCEDURE TRAY) ×2 IMPLANT
MANIFOLD NEPTUNE II (INSTRUMENTS) ×2 IMPLANT
NS IRRIG 1000ML POUR BTL (IV SOLUTION) ×2 IMPLANT
PACK TOTAL JOINT (CUSTOM PROCEDURE TRAY) ×2 IMPLANT
PAD ABD 7.5X8 STRL (GAUZE/BANDAGES/DRESSINGS) ×2 IMPLANT
PADDING CAST COTTON 6X4 STRL (CAST SUPPLIES) ×6 IMPLANT
POSITIONER SURGICAL ARM (MISCELLANEOUS) ×2 IMPLANT
SET HNDPC FAN SPRY TIP SCT (DISPOSABLE) ×1 IMPLANT
SPONGE GAUZE 4X4 12PLY (GAUZE/BANDAGES/DRESSINGS) ×2 IMPLANT
STRIP CLOSURE SKIN 1/2X4 (GAUZE/BANDAGES/DRESSINGS) ×4 IMPLANT
SUCTION FRAZIER 12FR DISP (SUCTIONS) ×2 IMPLANT
SUT MNCRL AB 4-0 PS2 18 (SUTURE) ×2 IMPLANT
SUT PDS AB 1 CT1 27 (SUTURE) ×6 IMPLANT
SUT VIC AB 2-0 CT1 27 (SUTURE) ×6
SUT VIC AB 2-0 CT1 TAPERPNT 27 (SUTURE) ×3 IMPLANT
TOWEL OR 17X26 10 PK STRL BLUE (TOWEL DISPOSABLE) ×4 IMPLANT
TRAY FOLEY CATH 14FRSI W/METER (CATHETERS) ×2 IMPLANT
WATER STERILE IRR 1500ML POUR (IV SOLUTION) ×2 IMPLANT
WRAP KNEE MAXI GEL POST OP (GAUZE/BANDAGES/DRESSINGS) ×4 IMPLANT

## 2011-11-02 NOTE — Interval H&P Note (Signed)
History and Physical Interval Note:  11/02/2011 9:58 AM  [name]  has presented today for surgery, with the diagnosis of Osteoarthritis of the Left Knee  The various methods of treatment have been discussed with the patient and family. After consideration of risks, benefits and other options for treatment, the patient has consented to  Procedure(s) (LRB): TOTAL KNEE ARTHROPLASTY (Left) as a surgical intervention .  The patients' history has been reviewed, patient examined, no change in status, stable for surgery.  I have reviewed the patients' chart and labs.  Questions were answered to the patient's satisfaction.     [name]

## 2011-11-02 NOTE — H&P (View-Only) (Signed)
NAMESKYLAR, [name]                ACCOUNT NO.:  [id number]  MEDICAL RECORD NO.:  [id number]  LOCATION:  [place]                           FACILITY:  [facility]  PHYSICIAN:  Gaynelle Arabian, M.D.    DATE OF BIRTH:  12-25-1960  DATE OF ADMISSION:  10/13/2011 DATE OF DISCHARGE:                             HISTORY & PHYSICAL   DATE OF SURGERY:  November 02, 2011.  ADMITTING DIAGNOSIS:  End-stage osteoarthritis of the left knee.  HISTORY OF PRESENT ILLNESS:  This is a 52 year old lady who has been having problems with her left knee for years, has had arthroscopy, viscosupplementation and has failed conservative management.  After discussion of treatments, benefits, risks, and options, the patient is now scheduled for total knee arthroplasty of the left knee.  Note that her medical doctor is Dr. Emi Belfast at [facility].  Her headache doctor is Dr. Domingo Cocking.  She plans on going home after surgery, but if doing poorly in therapy may request to go to a rehab facility.  We will see as the postoperative period goes on.  PAST MEDICAL HISTORY:  Drug allergy to CODEINE with nausea and vomiting and ONIONS with migraine headache.  CURRENT MEDICATIONS: 1. Effexor 225 mg daily. 2. Topamax 150 mg daily. 3. Omeprazole 20 mg daily. 4. Zyrtec 1 daily. 5. Aleve 2 daily, which she will stop 5 days before surgery. 6. Hydrocodone 5/325 1 daily p.r.n. 7. Relpax p.r.n. for migraines.  SERIOUS MEDICAL ILLNESSES:  Include: 1. Migraines. 2. Situational depression. 3. Reflux. 4. Allergies.  PREVIOUS SURGERIES:  Include: 1. Knee arthroscopy. 2. Tonsillectomy. 3. Excision of an osteophyte from anterior skull from trauma as a     child.  FAMILY HISTORY:  Positive for lung cancer, heart attack, coronary artery disease, anxiety, and thyroid disease.  SOCIAL HISTORY:  The patient is divorced.  She works as a Education officer, community in a counseling group.  She does not smoke and drinks several glasses of wine  per week.  She does live alone and is planning on going home with her mother for the first 2 weeks after surgery and then with her daughter thereafter.  REVIEW OF SYSTEMS:  CENTRAL NERVOUS SYSTEM:  Positive for fatigue and insomnia, also for migraine headaches and some hearing loss in the right ear.  PULMONARY:  Negative for shortness of breath, PND, and orthopnea. CARDIOVASCULAR:  Positive for a murmur.  She is scheduled for an echo on October 20, 2011.  Negative for chest pains or palpitation.  GI: Negative for ulcers or hepatitis, positive for reflux.  GU:  Negative for urinary tract difficulties other than urinary frequency. MUSCULOSKELETAL:  Positives in HPI.  Note that the patient also has intermittent hives for which she takes Zyrtec.  PHYSICAL EXAM:  VITAL SIGNS:  BP 140/88, pulse 72 and regular, respirations 14. HEENT:  HEAD normocephalic.  Nose patent, ears patent.  Pupils equal, round, react to light.  Throat without injection. NECK:  Supple without adenopathy.  Carotids 2+ without bruit. CHEST:  Clear to auscultation.  No rales or rhonchi.  Respirations 14. HEART:  Regular rate and rhythm at 72 beats per minute without murmur. ABDOMEN:  Soft with active  bowel sounds.  No masses or organomegaly. NEUROLOGIC:  Patient alert and oriented to time, place, and person. Cranial nerves II through XII grossly intact.  EXTREMITIES:  Show the left knee with a 5-degree flexion contracture, further flexion to 110 degrees with pain.  Dorsalis pedis, posterior tibialis pulses are 2+. She does have a varus deformity.  IMPRESSION:  End-stage osteoarthritis, left knee.  PLAN:  Total knee arthroplasty, left knee.     Judith Part. Antha Niday, P.A.   ______________________________ Gaynelle Arabian, M.D.    SJC/MEDQ  D:  10/15/2011  T:  10/16/2011  Job:  [id number]

## 2011-11-02 NOTE — Anesthesia Preprocedure Evaluation (Signed)
Anesthesia Evaluation  Patient identified by MRN, date of birth, ID band Patient awake    Reviewed: Allergy & Precautions, H&P , NPO status , Patient's Chart, lab work & pertinent test results, reviewed documented beta blocker date and time   History of Anesthesia Complications (+) PONV  Airway Mallampati: II TM Distance: >3 FB     Dental  (+) Teeth Intact   Pulmonary neg pulmonary ROS,  breath sounds clear to auscultation        Cardiovascular negative cardio ROS  Rhythm:Regular Rate:Normal  Denies cardiac symptoms Borderline HTN, no Rx   Neuro/Psych negative neurological ROS  negative psych ROS   GI/Hepatic negative GI ROS, Neg liver ROS,   Endo/Other  negative endocrine ROS  Renal/GU Cr 1.16  negative genitourinary   Musculoskeletal negative musculoskeletal ROS (+)   Abdominal   Peds negative pediatric ROS (+)  Hematology negative hematology ROS (+)   Anesthesia Other Findings   Reproductive/Obstetrics negative OB ROS                           Anesthesia Physical Anesthesia Plan  ASA: II  Anesthesia Plan: General   Post-op Pain Management:    Induction: Intravenous  Airway Management Planned: Oral ETT  Additional Equipment:   Intra-op Plan:   Post-operative Plan: Extubation in OR  Informed Consent:   Dental advisory given  Plan Discussed with: CRNA and Surgeon  Anesthesia Plan Comments:         Anesthesia Quick Evaluation

## 2011-11-02 NOTE — H&P (View-Only) (Signed)
H&P performed 10/15/2011 Dictation # [id number]

## 2011-11-02 NOTE — Anesthesia Postprocedure Evaluation (Signed)
  Anesthesia Post-op Note  Patient: [name]  Procedure(s) Performed: Procedure(s) (LRB): TOTAL KNEE ARTHROPLASTY (Left)  Patient Location: PACU  Anesthesia Type: General  Level of Consciousness: oriented and sedated  Airway and Oxygen Therapy: Patient Spontanous Breathing and Patient connected to nasal cannula oxygen  Post-op Pain: mild  Post-op Assessment: Post-op Vital signs reviewed, Patient's Cardiovascular Status Stable, Respiratory Function Stable and Patent Airway  Post-op Vital Signs: stable  Complications: No apparent anesthesia complications

## 2011-11-02 NOTE — Transfer of Care (Signed)
Immediate Anesthesia Transfer of Care Note  Patient: [name]  Procedure(s) Performed: Procedure(s) (LRB): TOTAL KNEE ARTHROPLASTY (Left)  Patient Location: PACU  Anesthesia Type: General  Level of Consciousness: awake, alert , oriented and patient cooperative  Airway & Oxygen Therapy: Patient Spontanous Breathing and Patient connected to face mask oxygen  Post-op Assessment: Report given to PACU RN and Post -op Vital signs reviewed and stable  Post vital signs: Reviewed and stable  Complications: No apparent anesthesia complications

## 2011-11-02 NOTE — Plan of Care (Signed)
Problem: Consults Goal: Diagnosis- Total Joint Replacement Primary Total Knee

## 2011-11-02 NOTE — Op Note (Signed)
Pre-operative diagnosis- Osteoarthritis  Left knee(s)  Post-operative diagnosis- Osteoarthritis Left knee(s)  Procedure-  Left  Total Knee Arthroplasty  Surgeon- Danielle Plover. Gerson Fauth, MD  Assistant- Ardeen Jourdain, PA-C   Anesthesia-  General EBL-* No blood loss amount entered *  Drains Hemovac  Tourniquet time-  Total Tourniquet Time Documented: Thigh (Left) - 44 minutes   Complications- None  Condition-PACU - hemodynamically stable.   Brief Clinical Note  [name] is a 51 y.o. year old female with end stage OA of her left knee with progressively worsening pain and dysfunction. She has constant pain, with activity and at rest and significant functional deficits with difficulties even with ADLs. She has had extensive non-op management including analgesics, injections of cortisone and viscosupplements, and home exercise program, but remains in significant pain with significant dysfunction. Radiographs show bone on bone arthritis medial and patellofemoral. She presents now for left Total Knee Arthroplasty.    Procedure in detail---   The patient is brought into the operating room and positioned supine on the operating table. After successful administration of  General,   a tourniquet is placed high on the  Left thigh(s) and the lower extremity is prepped and draped in the usual sterile fashion. Time out is performed by the operating team and then the  Left lower extremity is wrapped in Esmarch, knee flexed and the tourniquet inflated to 300 mmHg.       A midline incision is made with a ten blade through the subcutaneous tissue to the level of the extensor mechanism. A fresh blade is used to make a medial parapatellar arthrotomy. Soft tissue over the proximal medial tibia is subperiosteally elevated to the joint line with a knife and into the semimembranosus bursa with a Cobb elevator. Soft tissue over the proximal lateral tibia is elevated with attention being paid to avoiding the patellar  tendon on the tibial tubercle. The patella is everted, knee flexed 90 degrees and the ACL and PCL are removed. Findings are bone on bone medial and patellofemoral with large osteophyte formation.        The drill is used to create a starting hole in the distal femur and the canal is thoroughly irrigated with sterile saline to remove the fatty contents. The 5 degree Left  valgus alignment guide is placed into the femoral canal and the distal femoral cutting block is pinned to remove 11 mm off the distal femur. Resection is made with an oscillating saw.      The tibia is subluxed forward and the menisci are removed. The extramedullary alignment guide is placed referencing proximally at the medial aspect of the tibial tubercle and distally along the second metatarsal axis and tibial crest. The block is pinned to remove 4m off the more deficient medial  side. Resection is made with an oscillating saw. Size 3 is the most appropriate size for the tibia and the proximal tibia is prepared with the modular drill and keel punch for that size.      The femoral sizing guide is placed and size 3 is most appropriate. Rotation is marked off the epicondylar axis and confirmed by creating a rectangular flexion gap at 90 degrees. The size 3 cutting block is pinned in this rotation and the anterior, posterior and chamfer cuts are made with the oscillating saw. The intercondylar block is then placed and that cut is made.      Trial size 3 tibial component, trial size 3 posterior stabilized femur and a 12.5  mm posterior stabilized rotating platform insert trial is placed. Full extension is achieved with excellent varus/valgus and anterior/posterior balance throughout full range of motion. The patella is everted and thickness measured to be 22  mm. Free hand resection is taken to 12 mm, a 35 template is placed, lug holes are drilled, trial patella is placed, and it tracks normally. Osteophytes are removed off the posterior femur  with the trial in place. All trials are removed and the cut bone surfaces prepared with pulsatile lavage. Cement is mixed and once ready for implantation, the size 3 tibial implant, size  3 posterior stabilized femoral component, and the size 35 patella are cemented in place and the patella is held with the clamp. The trial insert is placed and the knee held in full extension. All extruded cement is removed and once the cement is hard the permanent 12.5 mm posterior stabilized rotating platform insert is placed into the tibial tray.      The wound is copiously irrigated with saline solution and the extensor mechanism closed over a hemovac drain with #1 PDS suture. The tourniquet is released for a total tourniquet time of 44  minutes. Flexion against gravity is 135 degrees and the patella tracks normally. Subcutaneous tissue is closed with 2.0 vicryl and subcuticular with running 4.0 Monocryl. The catheter for the Marcaine pain pump is placed and the pump is initiated. The incision is cleaned and dried and steri-strips and a bulky sterile dressing are applied. The limb is placed into a knee immobilizer and the patient is awakened and transported to recovery in stable condition.      Please note that a surgical assistant was a medical necessity for this procedure in order to perform it in a safe and expeditious manner. Surgical assistant was necessary to retract the ligaments and vital neurovascular structures to prevent injury to them and also necessary for proper positioning of the limb to allow for anatomic placement of the prosthesis.   Danielle Plover Treyvonne Tata, MD    11/02/2011, 11:26 AM

## 2011-11-03 LAB — BASIC METABOLIC PANEL
CO2: 29 mEq/L (ref 19–32)
Chloride: 103 mEq/L (ref 96–112)
Creatinine, Ser: 1.02 mg/dL (ref 0.50–1.10)
GFR calc Af Amer: 72 mL/min — ABNORMAL LOW (ref >90)
Potassium: 3.9 mEq/L (ref 3.5–5.1)
Sodium: 137 mEq/L (ref 135–145)

## 2011-11-03 LAB — CBC
Platelets: 211 10*3/uL (ref 150–400)
RBC: 3.22 MIL/uL — ABNORMAL LOW (ref 3.87–5.11)
RDW: 13.4 % (ref 11.5–15.5)
WBC: 5.3 10*3/uL (ref 4.0–10.5)

## 2011-11-03 MED ORDER — OMEPRAZOLE 20 MG PO CPDR
20.0000 mg | DELAYED_RELEASE_CAPSULE | Freq: Every day | ORAL | Status: DC
  Administered 2011-11-03 – 2011-11-05 (×3): 20 mg via ORAL
  Filled 2011-11-03 (×4): qty 1

## 2011-11-03 MED ORDER — HYDROMORPHONE HCL PF 1 MG/ML IJ SOLN
0.5000 mg | INTRAMUSCULAR | Status: DC | PRN
  Administered 2011-11-03 – 2011-11-04 (×4): 1 mg via INTRAVENOUS
  Filled 2011-11-03 (×4): qty 1

## 2011-11-03 MED ORDER — NON FORMULARY: 20.0000 mg | Freq: Every day | Status: DC

## 2011-11-03 MED ORDER — HYDROMORPHONE 0.3 MG/ML IV SOLN
INTRAVENOUS | Status: AC
  Administered 2011-11-03: 2.39 mg via INTRAVENOUS
  Filled 2011-11-03: qty 25

## 2011-11-03 NOTE — Evaluation (Signed)
Physical Therapy Evaluation Patient Details Name: ADAISHA CAMPISE MRN: [id number] DOB: 08-24-1961 Today's Date: 11/03/2011  Problem List:  Patient Active Problem List  Diagnoses  . Arthritis  . Migraine without aura  . Depression  . Menopausal syndrome  . Abnormal Pap smear  . Cardiac murmur  . GERD (gastroesophageal reflux disease)  . Urticaria    Past Medical History:  Past Medical History  Diagnosis Date  . Migraine without aura   . Menopausal syndrome   . Obese   . Arthritis   . Depression   . PONV (postoperative nausea and vomiting)   . Heart murmur     echo 10/20/2011  . GERD (gastroesophageal reflux disease)   . Plantar warts    Past Surgical History:  Past Surgical History  Procedure Date  . Tonsillectomy 1979  . Knee arthroscopy 2005  . 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    PT Assessment/Plan/Recommendation PT Assessment Clinical Impression Statement: Pt with L TKR presents with decreased L LE strength/ROM and limited functional mobility PT Recommendation/Assessment: Patient will need skilled PT in the acute care venue PT Problem List: Decreased strength;Decreased range of motion;Decreased activity tolerance;Decreased balance;Pain;Decreased mobility PT Therapy Diagnosis : Difficulty walking PT Plan PT Frequency: 7X/week PT Treatment/Interventions: DME instruction;Gait training;Stair training;Functional mobility training;Therapeutic activities;Therapeutic exercise;Patient/family education PT Recommendation Recommendations for Other Services: OT consult Follow Up Recommendations: Skilled nursing facility Equipment Recommended: Defer to next venue PT Goals  Acute Rehab PT Goals PT Goal Formulation: With patient Time For Goal Achievement: 7 days Pt will go Supine/Side to Sit: with supervision PT Goal: Supine/Side to Sit - Progress: Goal set today Pt will go Sit to Supine/Side: with supervision PT Goal: Sit to Supine/Side -  Progress: Goal set today Pt will go Sit to Stand: with supervision PT Goal: Sit to Stand - Progress: Goal set today Pt will go Stand to Sit: with supervision PT Goal: Stand to Sit - Progress: Goal set today Pt will Ambulate: 51 - 150 feet;with supervision;with rolling walker PT Goal: Ambulate - Progress: Goal set today  PT Evaluation Precautions/Restrictions  Precautions Precautions: Knee Required Braces or Orthoses: Yes Knee Immobilizer: Discontinue once straight leg raise with < 10 degree lag Restrictions Other Position/Activity Restrictions: WBAT Prior Functioning  Home Living Lives With: Alone Prior Function Level of Independence: Independent with basic ADLs;Independent with gait;Independent with transfers Able to Take Stairs?: Yes Driving: Yes Cognition Cognition Arousal/Alertness: Awake/alert Overall Cognitive Status: Appears within functional limits for tasks assessed Orientation Level: Oriented X4 Sensation/Coordination Coordination Gross Motor Movements are Fluid and Coordinated: Yes Extremity Assessment RUE Assessment RUE Assessment: Within Functional Limits LUE Assessment LUE Assessment: Within Functional Limits RLE Assessment RLE Assessment: Within Functional Limits LLE Assessment LLE Assessment: Exceptions to [facility] (-10 - 35 AAROM at knee; 2/5 quads) Mobility (including Balance) Bed Mobility Bed Mobility: Yes Supine to Sit: 3: Mod assist Supine to Sit Details (indicate cue type and reason): cues for sequence and self assist with UEs Transfers Transfers: Yes Sit to Stand: With upper extremity assist;From bed;1: +2 Total assist (pt 75 % ) Sit to Stand Details (indicate cue type and reason): cues for use of Ues and for LE managment Stand to Sit: 1: +2 Total assist;With upper extremity assist;With armrests;To chair/3-in-1 (pt 75%) Stand to Sit Details: cues for use of Ues and for LE managment Ambulation/Gait Ambulation/Gait: Yes Ambulation/Gait Assistance:  3: Mod assist Ambulation/Gait Assistance Details (indicate cue type and reason): cues for use of UEs and for  LE position Ambulation Distance (Feet): 53 Feet Assistive device: Rolling walker Gait Pattern: Step-to pattern    Exercise  Total Joint Exercises Ankle Circles/Pumps: AROM;10 reps;Supine;Both Quad Sets: AROM;10 reps;Supine;Both Heel Slides: AAROM;Left;10 reps;Supine Straight Leg Raises: AAROM;10 reps;Left;Supine End of Session PT - End of Session Activity Tolerance: Patient tolerated treatment well Patient left: in chair;with call bell in reach;with family/visitor present Nurse Communication: Mobility status for transfers;Mobility status for ambulation General Behavior During Session: Other (comment) (increased anxiety) Cognition: [facility] for tasks performed  Kailei Cowens 11/03/2011, 12:55 PM

## 2011-11-03 NOTE — Progress Notes (Signed)
Subjective: 1 Day Post-Op Procedure(s) (LRB): TOTAL KNEE ARTHROPLASTY (Left) Patient reports pain as mild.   Patient seen in rounds with Dr. Wynelle Link. Patient has complaints of some pain thru the night. We will start therapy today. Plan is to go home with her mother after hospital stay.  Objective: Vital signs in last 24 hours: Temp:  [97.1 F (36.2 C)-98.6 F (37 C)] 97.7 F (36.5 C) (03/12 0537) Pulse Rate:  [80-93] 81  (03/12 0537) Resp:  [10-17] 17  (03/12 0537) BP: (106-150)/(70-99) 107/73 mmHg (03/12 0537) SpO2:  [96 %-100 %] 100 % (03/12 0537) Weight:  [94.856 kg (209 lb 1.9 oz)] 94.856 kg (209 lb 1.9 oz) (03/11 1500)  Intake/Output from previous day:  Intake/Output Summary (Last 24 hours) at 11/03/11 0834 Last data filed at 11/03/11 0600  Gross per 24 hour  Intake 3363.75 ml  Output   2610 ml  Net 753.75 ml    Intake/Output this shift: UOP 1900  Labs:  [facility] 11/03/11 0413  HGB 10.3*    Basename 11/03/11 0413  WBC 5.3  RBC 3.22*  HCT 30.3*  PLT 211    Basename 11/03/11 0413  NA 137  K 3.9  CL 103  CO2 29  BUN 11  CREATININE 1.02  GLUCOSE 138*  CALCIUM 8.6   No results found for this basename: LABPT:2,INR:2 in the last 72 hours  Exam - Neurovascular intact Sensation intact distally Dressing - clean, dry, no drainage Motor function intact - moving foot and toes well on exam.  Hemovac pulled without difficulty.  Past Medical History  Diagnosis Date  . Migraine without aura   . Menopausal syndrome   . Obese   . Arthritis   . Depression   . PONV (postoperative nausea and vomiting)   . Heart murmur     echo 10/20/2011  . GERD (gastroesophageal reflux disease)   . Plantar warts     Assessment/Plan: 1 Day Post-Op Procedure(s) (LRB): TOTAL KNEE ARTHROPLASTY (Left) Active Problems:  * No active hospital problems. *    Advance diet Up with therapy Continue foley due to strict I&O and urinary output monitoring Discharge home with  home health  DVT Prophylaxis - Xarelto Protocol Weight-Bearing as tolerated to left leg Keep foley until tomorrow. No vaccines. D/C PCA Dilaudid, Change to IV push D/C O2 and Pulse OX and try on Room [address]  Mickel Crow 11/03/2011, 8:34 AM

## 2011-11-03 NOTE — Progress Notes (Signed)
OT Note:  Noted that pt will be discharging to [facility].  Will defer OT to that venue.  [place], OTR/L [phone] 11/03/2011

## 2011-11-03 NOTE — Progress Notes (Signed)
FL2 and 30 Day Note for MD signature. CSW met with pt this am to assist with d/c planning. Pt plans to d/c to SNF following hospitalization. Pt has been faxed out to [facility] and has received bed offer from [facility] ( 1 st choice ). CSW will follow to assist with d/c planning to SNF.  Werner Lean  LCSW [phone]

## 2011-11-03 NOTE — Progress Notes (Signed)
Physical Therapy Treatment Patient Details Name: DRUSILLA WAMPOLE MRN: [id number] DOB: Mar 14, 1961 Today's Date: 11/03/2011  PT Assessment/Plan  PT - Assessment/Plan Comments on Treatment Session: Pt continues very apprehensive regarding any activity PT Plan: Discharge plan remains appropriate PT Frequency: 7X/week Recommendations for Other Services: OT consult Follow Up Recommendations: Skilled nursing facility Equipment Recommended: Defer to next venue PT Goals  Acute Rehab PT Goals Time For Goal Achievement: 7 days Pt will go Supine/Side to Sit: with supervision PT Goal: Supine/Side to Sit - Progress: Goal set today Pt will go Sit to Supine/Side: with supervision PT Goal: Sit to Supine/Side - Progress: Goal set today Pt will go Sit to Stand: with supervision PT Goal: Sit to Stand - Progress: Goal set today Pt will go Stand to Sit: with supervision PT Goal: Stand to Sit - Progress: Goal set today Pt will Ambulate: 51 - 150 feet;with supervision;with rolling walker PT Goal: Ambulate - Progress: Goal set today  PT Treatment Precautions/Restrictions  Precautions Precautions: Knee Required Braces or Orthoses: Yes Knee Immobilizer: Discontinue once straight leg raise with < 10 degree lag Restrictions Weight Bearing Restrictions: No Other Position/Activity Restrictions: WBAT Mobility (including Balance) Bed Mobility Sit to Supine: 3: Mod assist Sit to Supine - Details (indicate cue type and reason): cues for sequence and self assist with UEs Transfers Sit to Stand: 3: Mod assist;With upper extremity assist;From chair/3-in-1;With armrests Sit to Stand Details (indicate cue type and reason): cues for use of Ues and for LE managment Stand to Sit: 3: Mod assist;4: Min assist;With upper extremity assist;To bed Stand to Sit Details: cues for use of Ues and for LE managment Ambulation/Gait Ambulation/Gait Assistance: 4: Min assist;3: Mod assist Ambulation/Gait Assistance Details  (indicate cue type and reason): cues for posture, sequence, stride length,  and position from RW Ambulation Distance (Feet): 60 Feet Assistive device: Rolling walker Gait Pattern: Step-to pattern    Exercise    End of Session PT - End of Session Equipment Utilized During Treatment: Left knee immobilizer Activity Tolerance: Patient tolerated treatment well Patient left: in bed;with call bell in reach;with family/visitor present Nurse Communication: Mobility status for transfers;Mobility status for ambulation General Behavior During Session: [facility] for tasks performed Cognition: [facility] for tasks performed  Edia Pursifull 11/03/2011, 3:12 PM

## 2011-11-04 LAB — CBC
HCT: 28 % — ABNORMAL LOW (ref 36.0–46.0)
Hemoglobin: 9.6 g/dL — ABNORMAL LOW (ref 12.0–15.0)
RBC: 2.99 MIL/uL — ABNORMAL LOW (ref 3.87–5.11)
RDW: 13.2 % (ref 11.5–15.5)
WBC: 6.3 10*3/uL (ref 4.0–10.5)

## 2011-11-04 LAB — BASIC METABOLIC PANEL
BUN: 7 mg/dL (ref 6–23)
CO2: 27 mEq/L (ref 19–32)
Chloride: 100 mEq/L (ref 96–112)
GFR calc Af Amer: 76 mL/min — ABNORMAL LOW (ref >90)
Potassium: 3.3 mEq/L — ABNORMAL LOW (ref 3.5–5.1)

## 2011-11-04 MED ORDER — ALUM & MAG HYDROXIDE-SIMETH 200-200-20 MG/5ML PO SUSP
30.0000 mL | ORAL | Status: DC | PRN
  Administered 2011-11-04: 30 mL via ORAL
  Filled 2011-11-04: qty 30

## 2011-11-04 NOTE — Progress Notes (Signed)
Physical Therapy Treatment Patient Details Name: ROSALENA MCCORRY MRN: [id number] DOB: Nov 30, 1960 Today's Date: 11/04/2011  PT Assessment/Plan  PT - Assessment/Plan Comments on Treatment Session: Pt continues with increased anxiety and c/o nausea PT Plan: Discharge plan remains appropriate PT Frequency: 7X/week Recommendations for Other Services: OT consult Follow Up Recommendations: Skilled nursing facility Equipment Recommended: Defer to next venue PT Goals  Acute Rehab PT Goals PT Goal Formulation: With patient Time For Goal Achievement: 7 days Pt will go Sit to Supine/Side: with supervision PT Goal: Sit to Supine/Side - Progress: Progressing toward goal Pt will go Sit to Stand: with supervision PT Goal: Sit to Stand - Progress: Progressing toward goal Pt will go Stand to Sit: with supervision PT Goal: Stand to Sit - Progress: Progressing toward goal Pt will Ambulate: 51 - 150 feet;with supervision;with rolling walker PT Goal: Ambulate - Progress: Progressing toward goal  PT Treatment Precautions/Restrictions  Precautions Precautions: Knee Required Braces or Orthoses: Yes Knee Immobilizer: Discontinue once straight leg raise with < 10 degree lag Restrictions Weight Bearing Restrictions: No Other Position/Activity Restrictions: WBAT Mobility (including Balance) Bed Mobility Sit to Supine: 4: Min assist Sit to Supine - Details (indicate cue type and reason): cues for sequence and assist for L LE Transfers Sit to Stand: 4: Min assist;3: Mod assist;From chair/3-in-1;With upper extremity assist;With armrests Sit to Stand Details (indicate cue type and reason): cues for use of Ues and for LE managment Stand to Sit: 4: Min assist;3: Mod assist;With upper extremity assist;To bed Stand to Sit Details: cues for use of Ues and for LE managment Ambulation/Gait Ambulation/Gait Assistance: 4: Min assist Ambulation/Gait Assistance Details (indicate cue type and reason): cues for  sequence, posture and position from RW Ambulation Distance (Feet): 6 Feet Assistive device: Rolling walker Gait Pattern: Step-to pattern    Exercise  Total Joint Exercises Ankle Circles/Pumps: AROM;Both;20 reps;Supine Quad Sets: AROM;20 reps;Supine;Both Heel Slides: AAROM;20 reps;Supine;Left Straight Leg Raises: AAROM;20 reps;Left;Supine End of Session PT - End of Session Equipment Utilized During Treatment: Left knee immobilizer Activity Tolerance: Patient limited by pain;Patient limited by fatigue Patient left: in bed;with call bell in reach General Behavior During Session: [facility] for tasks performed Cognition: [facility] for tasks performed  Jose Alleyne 11/04/2011, 12:50 PM

## 2011-11-04 NOTE — Progress Notes (Signed)
Subjective: 2 Days Post-Op Procedure(s) (LRB): TOTAL KNEE ARTHROPLASTY (Left) Patient reports pain as moderate.   Patient has complaints of pain and muscular soreness  Objective: Vital signs in last 24 hours: Temp:  [97.9 F (36.6 C)-98.9 F (37.2 C)] 98.9 F (37.2 C) (03/13 0649) Pulse Rate:  [86-94] 94  (03/13 0649) Resp:  [15-16] 15  (03/13 0649) BP: (116-131)/(76-84) 125/77 mmHg (03/13 0649) SpO2:  [96 %-100 %] 97 % (03/13 0649)  Intake/Output from previous day:  Intake/Output Summary (Last 24 hours) at 11/04/11 0808 Last data filed at 11/04/11 0649  Gross per 24 hour  Intake   1280 ml  Output   4050 ml  Net  -2770 ml    Intake/Output this shift:    Labs:  Basename 11/04/11 0420 11/03/11 0413  HGB 9.6* 10.3*    Basename 11/04/11 0420 11/03/11 0413  WBC 6.3 5.3  RBC 2.99* 3.22*  HCT 28.0* 30.3*  PLT 188 211    Basename 11/04/11 0420 11/03/11 0413  NA 133* 137  K 3.3* 3.9  CL 100 103  CO2 27 29  BUN 7 11  CREATININE 0.97 1.02  GLUCOSE 127* 138*  CALCIUM 8.6 8.6   No results found for this basename: LABPT:2,INR:2 in the last 72 hours  Exam - Neurologically intact Neurovascular intact Incision: dressing C/D/I No cellulitis present Compartment soft Dressing/Incision - clean, dry, no drainage Motor function intact - moving foot and toes well on exam.   Past Medical History  Diagnosis Date  . Migraine without aura   . Menopausal syndrome   . Obese   . Arthritis   . Depression   . PONV (postoperative nausea and vomiting)   . Heart murmur     echo 10/20/2011  . GERD (gastroesophageal reflux disease)   . Plantar warts     Assessment/Plan: 2 Days Post-Op Procedure(s) (LRB): TOTAL KNEE ARTHROPLASTY (Left) Active Problems:  * No active hospital problems. *    Advance diet Up with therapy D/C IV fluids Discharge to SNF tomorrow  DVT Prophylaxis - Xarelto  Protocol Weight-Bearing as tolerated to left leg  Angeliah Wisdom V 11/04/2011, 8:08  AM

## 2011-11-04 NOTE — Progress Notes (Addendum)
Physical Therapy Treatment Patient Details Name: [name] MRN: [id number] DOB: 1961/05/18 Today's Date: 11/04/2011  PT Assessment/Plan  PT - Assessment/Plan Comments on Treatment Session: Pt continues with increased anxiety and c/o nausea PT Plan: Discharge plan remains appropriate PT Frequency: 7X/week Recommendations for Other Services: OT consult Follow Up Recommendations: Skilled nursing facility Equipment Recommended: Defer to next venue PT Goals  Acute Rehab PT Goals Time For Goal Achievement: 7 days Pt will go Supine/Side to Sit: with supervision PT Goal: Supine/Side to Sit - Progress: Progressing toward goal Pt will go Sit to Stand: with supervision PT Goal: Sit to Stand - Progress: Progressing toward goal Pt will go Stand to Sit: with supervision PT Goal: Stand to Sit - Progress: Progressing toward goal Pt will Ambulate: 51 - 150 feet;with supervision;with rolling walker PT Goal: Ambulate - Progress: Progressing toward goal  PT Treatment Precautions/Restrictions  Precautions Precautions: Knee Required Braces or Orthoses: Yes Knee Immobilizer: Discontinue once straight leg raise with < 10 degree lag Restrictions Weight Bearing Restrictions: No Other Position/Activity Restrictions: WBAT Mobility (including Balance) Bed Mobility Supine to Sit: 4: Min assist Supine to Sit Details (indicate cue type and reason): cues for sequence and self assist with UEs Transfers Sit to Stand: 4: Min assist;3: Mod assist;With upper extremity assist;From bed;From chair/3-in-1;With armrests Sit to Stand Details (indicate cue type and reason): cues for use of Ues and for LE managment Stand to Sit: 4: Min assist;3: Mod assist;With upper extremity assist;To chair/3-in-1;With armrests Stand to Sit Details: cues for use of Ues and for LE managment Ambulation/Gait Ambulation/Gait Assistance: 4: Min assist Ambulation/Gait Assistance Details (indicate cue type and reason): cues for  posture, stride length, position from RW and sequence Ambulation Distance (Feet): 125 Feet (20 + 125') Assistive device: Rolling walker Gait Pattern: Step-to pattern    End of Session PT - End of Session Equipment Utilized During Treatment: Left knee immobilizer Activity Tolerance: Patient tolerated treatment well;Other (comment) (ltd by nausea) Patient left: in chair;with call bell in reach Nurse Communication: Mobility status for transfers;Mobility status for ambulation General Behavior During Session: [facility] for tasks performed Cognition: [facility] for tasks performed  Caprina Wussow 11/04/2011, 11:27 AM

## 2011-11-05 ENCOUNTER — Encounter (HOSPITAL_COMMUNITY): Payer: Self-pay | Admitting: Orthopedic Surgery

## 2011-11-05 DIAGNOSIS — D62 Acute posthemorrhagic anemia: Secondary | ICD-10-CM

## 2011-11-05 DIAGNOSIS — E876 Hypokalemia: Secondary | ICD-10-CM | POA: Diagnosis not present

## 2011-11-05 DIAGNOSIS — E871 Hypo-osmolality and hyponatremia: Secondary | ICD-10-CM | POA: Diagnosis not present

## 2011-11-05 HISTORY — DX: Acute posthemorrhagic anemia: D62

## 2011-11-05 LAB — CBC
HCT: 27.3 % — ABNORMAL LOW (ref 36.0–46.0)
Hemoglobin: 9.3 g/dL — ABNORMAL LOW (ref 12.0–15.0)
MCHC: 34.1 g/dL (ref 30.0–36.0)
MCV: 93.2 fL (ref 78.0–100.0)

## 2011-11-05 MED ORDER — HYDROMORPHONE HCL 2 MG PO TABS: 2.0000 mg | ORAL_TABLET | ORAL | Status: AC | PRN

## 2011-11-05 MED ORDER — METHOCARBAMOL 500 MG PO TABS: 500.0000 mg | ORAL_TABLET | Freq: Four times a day (QID) | ORAL | Status: AC | PRN

## 2011-11-05 MED ORDER — DSS 100 MG PO CAPS: 100.0000 mg | ORAL_CAPSULE | Freq: Two times a day (BID) | ORAL | Status: AC

## 2011-11-05 MED ORDER — ACETAMINOPHEN 325 MG PO TABS: 650.0000 mg | ORAL_TABLET | Freq: Four times a day (QID) | ORAL | Status: AC | PRN

## 2011-11-05 MED ORDER — RIVAROXABAN 10 MG PO TABS: 10.0000 mg | ORAL_TABLET | Freq: Every day | ORAL | Status: DC

## 2011-11-05 MED ORDER — BISACODYL 10 MG RE SUPP: 10.0000 mg | Freq: Every day | RECTAL | Status: AC | PRN

## 2011-11-05 MED ORDER — POLYETHYLENE GLYCOL 3350 17 G PO PACK: 17.0000 g | PACK | Freq: Every day | ORAL | Status: AC | PRN

## 2011-11-05 MED ORDER — ALUM & MAG HYDROXIDE-SIMETH 200-200-20 MG/5ML PO SUSP: 30.0000 mL | ORAL | Status: AC | PRN

## 2011-11-05 NOTE — Progress Notes (Signed)
Physical Therapy Treatment Patient Details Name: [name] MRN: [id number] DOB: 07-Oct-1960 Today's Date: 11/05/2011  PT Assessment/Plan  PT - Assessment/Plan Comments on Treatment Session: Pt continues with ++ muscle guarding wtih attempts to flex L knee 2* c/o pain PT Plan: Discharge plan remains appropriate PT Frequency: 7X/week Follow Up Recommendations: Skilled nursing facility Equipment Recommended: Defer to next venue PT Goals  Acute Rehab PT Goals PT Goal Formulation: With patient Time For Goal Achievement: 7 days Pt will go Supine/Side to Sit: with supervision PT Goal: Supine/Side to Sit - Progress: Met Pt will go Sit to Supine/Side: with supervision PT Goal: Sit to Supine/Side - Progress: Met Pt will go Sit to Stand: with supervision PT Goal: Sit to Stand - Progress: Met Pt will go Stand to Sit: with supervision PT Goal: Stand to Sit - Progress: Met Pt will Ambulate: 51 - 150 feet;with supervision;with rolling walker PT Goal: Ambulate - Progress: Met  PT Treatment Precautions/Restrictions  Precautions Precautions: Knee Required Braces or Orthoses: Yes Knee Immobilizer: Discontinue once straight leg raise with < 10 degree lag Restrictions Weight Bearing Restrictions: No Other Position/Activity Restrictions: WBAT Mobility (including Balance) Bed Mobility Supine to Sit: 5: Supervision Supine to Sit Details (indicate cue type and reason): cues to self-assist LE with UEs Sit to Supine: 5: Supervision Sit to Supine - Details (indicate cue type and reason): cues to self-assist LE with UEs Transfers Sit to Stand: 5: Supervision Sit to Stand Details (indicate cue type and reason): cues for use of UEs Stand to Sit: 5: Supervision Stand to Sit Details: cues for use of UEs Ambulation/Gait Ambulation/Gait Assistance: 5: Supervision Ambulation/Gait Assistance Details (indicate cue type and reason): cues for posture and position from RW Ambulation Distance (Feet): 300  Feet Assistive device: Rolling walker Gait Pattern: Step-to pattern    Exercise  Total Joint Exercises Ankle Circles/Pumps: AROM;Both;20 reps;Supine Quad Sets: AROM;20 reps;Supine;Both Heel Slides: AAROM;20 reps;Supine;Left Straight Leg Raises: AAROM;20 reps;Left;Supine End of Session PT - End of Session Equipment Utilized During Treatment: Left knee immobilizer Activity Tolerance: Patient tolerated treatment well Patient left: in bed;with call bell in reach Nurse Communication: Mobility status for transfers;Mobility status for ambulation General Behavior During Session: [facility] for tasks performed Cognition: [facility] for tasks performed  [name] 11/05/2011, 12:53 PM

## 2011-11-05 NOTE — Discharge Instructions (Signed)
Knee Rehabilitation, Guidelines Following Surgery Results after knee surgery are often greatly improved when you follow the exercise, range of motion and muscle strengthening exercises prescribed by your doctor. Safety measures are also important to protect the knee from further injury. Any time any of these exercises cause you to have increased pain or swelling in your knee joint, decrease the amount until you are comfortable again and slowly increase them. If you have problems or questions, call your caregiver or physical therapist for advice. HOME CARE INSTRUCTIONS   Remove items at home which could result in a fall. This includes throw rugs or furniture in walking pathways.   Continue medications as instructed.   You may shower or take tub baths when your staples or stitches are removed or as instructed.   Walk using crutches or walker as instructed.   Put weight on your legs and walk as much as is comfortable.   You may resume a sexual relationship in one month or when given the OK by your doctor.   Return to work as instructed by your doctor.   Do not drive a car for 6 weeks or as instructed.   Wear elastic stockings until instructed not to.   Make sure you keep all of your appointments after your operation with all of your doctors and caregivers.  RANGE OF MOTION AND STRENGTHENING EXERCISES Rehabilitation of the knee is important following a knee injury or an operation. After just a few days of immobilization, the muscles of the thigh which control the knee become weakened and shrink (atrophy). Knee exercises are designed to build up the tone and strength of the thigh muscles and to improve knee motion. Often times heat used for twenty to thirty minutes before working out will loosen up your tissues and help with improving the range of motion. These exercises can be done on a training (exercise) mat, on the floor, on a table or on a bed. Use what ever works the best and is most  comfortable for you Knee exercises include:  Leg Lifts - While your knee is still immobilized in a splint or cast, you can do straight leg raises. Lift the leg to 60 degrees, hold for 3 sec, and slowly lower the leg. Repeat 10-20 times 2-3 times daily. Perform this exercise against resistance later as your knee gets better.   Quad and Hamstring Sets - Tighten up the muscle on the front of the thigh (Quad) and hold for 5-10 sec. Repeat this 10-20 times hourly. Hamstring sets are done by pushing the foot backward against an object and holding for 5-10 sec. Repeat as with quad sets.  A rehabilitation program following serious knee injuries can speed recovery and prevent re-injury in the future due to weakened muscles. Contact your doctor or a physical therapist for more information on knee rehabilitation. MAKE SURE YOU:   Understand these instructions.   Will watch your condition.   Will get help right away if you are not doing well or get worse.  Document Released: 08/10/2005 Document Revised: 07/30/2011 Document Reviewed: 01/28/2007 [facility] Patient Information 2012 [place].  Pick up stool softner and laxative for home. Do not submerge incision under water. May shower. Continue to use ice for pain and swelling from surgery.

## 2011-11-05 NOTE — Discharge Summary (Signed)
Physician Discharge Summary   Patient ID: [name] MRN: [id number] DOB/AGE: 1960/12/25 51 y.o.  Admit date: 11/02/2011 Discharge date: 11/05/2011  Primary Diagnosis: End-stage osteoarthritis, left knee.  Admission Diagnoses: Past Medical History  Diagnosis Date  . Migraine without aura   . Menopausal syndrome   . Obese   . Arthritis   . Depression   . PONV (postoperative nausea and vomiting)   . Heart murmur     echo 10/20/2011  . GERD (gastroesophageal reflux disease)   . Plantar warts   . Postop Acute blood loss anemia 11/05/2011    Discharge Diagnoses:  Active Problems:  Postop Hyponatremia  Postop Hypokalemia  Postop Acute blood loss anemia   Procedure: Procedure(s) (LRB): TOTAL KNEE ARTHROPLASTY (Left)   Consults: None  HPI: [name] is a 51 y.o. year old female with end stage OA of her left knee with progressively worsening pain and dysfunction. She has constant pain, with activity and at rest and significant functional deficits with difficulties even with ADLs. She has had extensive non-op management including analgesics, injections of cortisone and viscosupplements, and home exercise program, but remains in significant pain with significant dysfunction. Radiographs show bone on bone arthritis medial and patellofemoral. She presents now for left Total Knee Arthroplasty.   Laboratory Data: Hospital Outpatient Visit on 10/23/2011  Component Date Value Range Status  . aPTT (seconds) 10/23/2011 30  24-37 Final  . WBC (K/uL) 10/23/2011 4.3  4.0-10.5 Final  . RBC (MIL/uL) 10/23/2011 4.64  3.87-5.11 Final  . Hemoglobin (g/dL) 10/23/2011 14.5  12.0-15.0 Final  . HCT (%) 10/23/2011 43.6  36.0-46.0 Final  . MCV (fL) 10/23/2011 94.0  78.0-100.0 Final  . MCH (pg) 10/23/2011 31.3  26.0-34.0 Final  . MCHC (g/dL) 10/23/2011 33.3  30.0-36.0 Final  . RDW (%) 10/23/2011 13.2  11.5-15.5 Final  . Platelets (K/uL) 10/23/2011 294  150-400 Final  . Sodium (mEq/L)  10/23/2011 141  135-145 Final  . Potassium (mEq/L) 10/23/2011 4.1  3.5-5.1 Final  . Chloride (mEq/L) 10/23/2011 105  96-112 Final  . CO2 (mEq/L) 10/23/2011 26  19-32 Final  . Glucose, Bld (mg/dL) 10/23/2011 55* 70-99 Final  . BUN (mg/dL) 10/23/2011 13  6-23 Final  . Creatinine, Ser (mg/dL) 10/23/2011 1.16* 0.50-1.10 Final  . Calcium (mg/dL) 10/23/2011 9.7  8.4-10.5 Final  . Total Protein (g/dL) 10/23/2011 7.3  6.0-8.3 Final  . Albumin (g/dL) 10/23/2011 4.3  3.5-5.2 Final  . AST (U/L) 10/23/2011 16  0-37 Final  . ALT (U/L) 10/23/2011 14  0-35 Final  . Alkaline Phosphatase (U/L) 10/23/2011 74  39-117 Final  . Total Bilirubin (mg/dL) 10/23/2011 0.3  0.3-1.2 Final  . GFR calc non Af Amer (mL/min) 10/23/2011 53* >90 Final  . GFR calc Af Amer (mL/min) 10/23/2011 62* >90 Final   Comment:                                 The eGFR has been calculated                          using the CKD EPI equation.                          This calculation has not been  validated in all clinical                          situations.                          eGFR's persistently                          <90 mL/min signify                          possible Chronic Kidney Disease.  Marland Kitchen Prothrombin Time (seconds) 10/23/2011 13.3  11.6-15.2 Final  . INR  10/23/2011 0.99  0.00-1.49 Final  . Color, Urine  10/23/2011 YELLOW  YELLOW Final  . APPearance  10/23/2011 CLOUDY* CLEAR Final  . Specific Gravity, Urine  10/23/2011 1.022  1.005-1.030 Final  . pH  10/23/2011 6.0  5.0-8.0 Final  . Glucose, UA (mg/dL) 10/23/2011 NEGATIVE  NEGATIVE Final  . Hgb urine dipstick  10/23/2011 NEGATIVE  NEGATIVE Final  . Bilirubin Urine  10/23/2011 NEGATIVE  NEGATIVE Final  . Ketones, ur (mg/dL) 10/23/2011 NEGATIVE  NEGATIVE Final  . Protein, ur (mg/dL) 10/23/2011 NEGATIVE  NEGATIVE Final  . Urobilinogen, UA (mg/dL) 10/23/2011 0.2  0.0-1.0 Final  . Nitrite  10/23/2011 NEGATIVE  NEGATIVE Final  . Leukocytes, UA   10/23/2011 NEGATIVE  NEGATIVE Final   MICROSCOPIC NOT DONE ON URINES WITH NEGATIVE PROTEIN, BLOOD, LEUKOCYTES, NITRITE, OR GLUCOSE <1000 mg/dL.  Marland Kitchen MRSA, PCR  10/23/2011 NEGATIVE  NEGATIVE Final  . Staphylococcus aureus  10/23/2011 POSITIVE* NEGATIVE Final   Comment:                                 The Xpert SA Assay (FDA                          approved for NASAL specimens                          only), is one component of                          a comprehensive surveillance                          program.  It is not intended                          to diagnose infection nor to                          guide or monitor treatment.    Basename 11/05/11 0435 11/04/11 0420 11/03/11 0413  HGB 9.3* 9.6* 10.3*    Basename 11/05/11 0435 11/04/11 0420  WBC 7.7 6.3  RBC 2.93* 2.99*  HCT 27.3* 28.0*  PLT 199 188    Basename 11/04/11 0420 11/03/11 0413  NA 133* 137  K 3.3* 3.9  CL 100 103  CO2 27 29  BUN 7 11  CREATININE 0.97 1.02  GLUCOSE 127* 138*  CALCIUM 8.6 8.6   No results found for this basename: LABPT:2,INR:2 in the last 72 hours  X-Rays:  Chest 2 View  10/23/2011  *RADIOLOGY REPORT*  Clinical Data: Preop for knee replacement.  CHEST - 2 VIEW  Comparison: None.  Findings: Patient rotated minimally right. Midline trachea.  Normal heart size and mediastinal contours. No pleural effusion or pneumothorax.  Clear lungs.  IMPRESSION: Normal chest.  Original Report Authenticated By: Areta Haber, M.D.    EKG: Orders placed in visit on 10/13/11  . EKG 12-LEAD     Hospital Course: Patient was admitted to [facility] and taken to the OR and underwent the above state procedure without complications.  Patient tolerated the procedure well and was later transferred to the recovery room and then to the orthopaedic floor for postoperative care.  They were given PO and IV analgesics for pain control following their surgery.  They were given 24 hours of postoperative  antibiotics and started on DVT prophylaxis.   PT and OT were ordered for total joint protocol.  Discharge planning consulted to help with postop disposition and equipment needs.  Patient had a tough night on the evening of surgery but started to get up with therapy on day one.  PCA was discontinued and they were weaned over to PO meds.  Hemovac drain was pulled without difficulty.  Continued to progress with therapy into day two waling over 100 feet.  Dressing was changed on day two and the incision was healing well.  By day three, the patient had progressed with therapy and meeting goals.  Incision was healing well.  Patient was seen in rounds and was ready to go home.   Discharge Medications: Prior to Admission medications   Medication Sig Start Date End Date Taking? Authorizing Provider  cetirizine (ZYRTEC) 10 MG tablet Take 10 mg by mouth daily.   Yes Historical Provider, MD  diphenhydramine-acetaminophen (TYLENOL PM) 25-500 MG TABS Take 2 tablets by mouth at bedtime.   Yes Historical Provider, MD  omeprazole (PRILOSEC OTC) 20 MG tablet Take 20 mg by mouth daily.   Yes Historical Provider, MD  topiramate (TOPAMAX) 100 MG tablet Take 150 mg by mouth at bedtime.    Yes Historical Provider, MD  venlafaxine (EFFEXOR-XR) 75 MG 24 hr capsule Take 225 mg by mouth daily after breakfast.    Yes Historical Provider, MD  acetaminophen (TYLENOL) 325 MG tablet Take 2 tablets (650 mg total) by mouth every 6 (six) hours as needed (or Fever >/= 101). 11/05/11 11/04/12  Nura Cahoon, PA  alum & mag hydroxide-simeth (MAALOX/MYLANTA) 200-200-20 MG/5ML suspension Take 30 mLs by mouth every 4 (four) hours as needed. 11/05/11 11/15/11  Khalon Cansler, PA  bisacodyl (DULCOLAX) 10 MG suppository Place 1 suppository (10 mg total) rectally daily as needed. 11/05/11 11/15/11  Jestine Bicknell, PA  docusate sodium 100 MG CAPS Take 100 mg by mouth 2 (two) times daily. 11/05/11 11/15/11  Jailan Trimm, PA    eletriptan (RELPAX) 40 MG tablet One tablet by mouth as needed for migraine headache.  If the headache improves and then returns, dose may be repeated after 2 hours have elapsed since first dose (do not exceed 80 mg per day). may repeat in 2 hours if necessary. Headache     Historical Provider, MD  HYDROmorphone (DILAUDID) 2 MG tablet Take 1-2 tablets (2-4 mg total) by mouth every 4 (four) hours as needed. 11/05/11 11/15/11  Kejon Feild, PA  methocarbamol (ROBAXIN) 500 MG tablet Take 1 tablet (500 mg total) by mouth every 6 (six) hours as needed. 11/05/11 11/15/11  Autumn Pruitt Dara Lords, PA  polyethylene glycol (MIRALAX / GLYCOLAX) packet Take 17 g by mouth daily as needed. 11/05/11 11/08/11  Monserrath Junio Dara Lords, PA  rivaroxaban (XARELTO) 10 MG TABS tablet Take 1 tablet (10 mg total) by mouth daily with breakfast. 11/05/11   Sharif Rendell Dara Lords, PA    Diet: regular Activity:WBAT Follow-up:in 2 weeks Disposition: [facility] Discharged Condition: good   Discharge Orders    Future Orders Please Complete By Expires   Diet - low sodium heart healthy      Call MD / Call 911      Comments:   If you experience chest pain or shortness of breath, CALL 911 and be transported to the hospital emergency room.  If you develope a fever above 101 F, pus (white drainage) or increased drainage or redness at the wound, or calf pain, call your surgeon's office.   Constipation Prevention      Comments:   Drink plenty of fluids.  Prune juice may be helpful.  You may use a stool softener, such as Colace (over the counter) 100 mg twice a day.  Use MiraLax (over the counter) for constipation as needed.   Increase activity slowly as tolerated      Weight Bearing as taught in Physical Therapy      Comments:   Use a walker or crutches as instructed.   Discharge instructions      Comments:   Pick up stool softner and laxative for home. Do not submerge incision under water. May shower. Continue to use ice for  pain and swelling from surgery.    Driving restrictions      Comments:   No driving   Lifting restrictions      Comments:   No lifting   TED hose      Comments:   Use stockings (TED hose) for 3 weeks on both leg(s).  You may remove them at night for sleeping.   Change dressing      Comments:   Change dressing daily with sterile 4 x 4 inch gauze dressing and apply TED hose.   Do not put a pillow under the knee. Place it under the heel.        Medication List  As of 11/05/2011  8:24 AM   STOP taking these medications         HYDROcodone-acetaminophen 10-325 MG per tablet      Melatonin 10 MG Tabs      naproxen sodium 220 MG tablet         TAKE these medications         acetaminophen 325 MG tablet   Commonly known as: TYLENOL   Take 2 tablets (650 mg total) by mouth every 6 (six) hours as needed (or Fever >/= 101).      alum & mag hydroxide-simeth 200-200-20 MG/5ML suspension   Commonly known as: MAALOX/MYLANTA   Take 30 mLs by mouth every 4 (four) hours as needed.      bisacodyl 10 MG suppository   Commonly known as: DULCOLAX   Place 1 suppository (10 mg total) rectally daily as needed.      cetirizine 10 MG tablet   Commonly known as: ZYRTEC   Take 10 mg by mouth daily.      diphenhydramine-acetaminophen 25-500 MG Tabs   Commonly known as: TYLENOL PM   Take 2 tablets by mouth at bedtime.      DSS 100 MG Caps   Take 100 mg by mouth 2 (two) times daily.  eletriptan 40 MG tablet   Commonly known as: RELPAX   One tablet by mouth as needed for migraine headache.  If the headache improves and then returns, dose may be repeated after 2 hours have elapsed since first dose (do not exceed 80 mg per day). may repeat in 2 hours if necessary. Headache        HYDROmorphone 2 MG tablet   Commonly known as: DILAUDID   Take 1-2 tablets (2-4 mg total) by mouth every 4 (four) hours as needed.      methocarbamol 500 MG tablet   Commonly known as: ROBAXIN   Take 1 tablet  (500 mg total) by mouth every 6 (six) hours as needed.      omeprazole 20 MG tablet   Commonly known as: PRILOSEC OTC   Take 20 mg by mouth daily.      polyethylene glycol packet   Commonly known as: MIRALAX / GLYCOLAX   Take 17 g by mouth daily as needed.      rivaroxaban 10 MG Tabs tablet   Commonly known as: XARELTO   Take 1 tablet (10 mg total) by mouth daily with breakfast.      topiramate 100 MG tablet   Commonly known as: TOPAMAX   Take 150 mg by mouth at bedtime.      venlafaxine 75 MG 24 hr capsule   Commonly known as: EFFEXOR-XR   Take 225 mg by mouth daily after breakfast.           Follow-up Information    Follow up with [name], MD. Schedule an appointment as soon as possible for a visit in 2 weeks.   Contact information:   [facility] [address] [place] [phone]          Signed: Mickel Crow 11/05/2011, 8:24 AM

## 2011-11-05 NOTE — Progress Notes (Signed)
Subjective: 3 Days Post-Op Procedure(s) (LRB): TOTAL KNEE ARTHROPLASTY (Left) Patient reports pain as mild.   Patient seen in rounds with Dr. Wynelle Link. Patient doing OK. Plan is to go to [facility] today.  Objective: Vital signs in last 24 hours: Temp:  [98.2 F (36.8 C)-99.7 F (37.6 C)] 99.7 F (37.6 C) (03/14 0659) Pulse Rate:  [95-101] 101  (03/14 0659) Resp:  [14-16] 16  (03/14 0659) BP: (121-142)/(77-87) 142/84 mmHg (03/14 0659) SpO2:  [96 %-100 %] 96 % (03/14 0659)  Intake/Output from previous day:  Intake/Output Summary (Last 24 hours) at 11/05/11 0813 Last data filed at 11/05/11 0245  Gross per 24 hour  Intake    803 ml  Output   2450 ml  Net  -1647 ml    Labs: Results for orders placed during the hospital encounter of 11/02/11  TYPE AND SCREEN      Component Value Range   ABO/RH(D) AB NEG     Antibody Screen NEG     Sample Expiration 11/05/2011    HCG, SERUM, QUALITATIVE      Component Value Range   Preg, Serum NEGATIVE  NEGATIVE   ABO/RH      Component Value Range   ABO/RH(D) AB NEG    CBC      Component Value Range   WBC 5.3  4.0 - 10.5 (K/uL)   RBC 3.22 (*) 3.87 - 5.11 (MIL/uL)   Hemoglobin 10.3 (*) 12.0 - 15.0 (g/dL)   HCT 30.3 (*) 36.0 - 46.0 (%)   MCV 94.1  78.0 - 100.0 (fL)   MCH 32.0  26.0 - 34.0 (pg)   MCHC 34.0  30.0 - 36.0 (g/dL)   RDW 13.4  11.5 - 15.5 (%)   Platelets 211  150 - 400 (K/uL)  BASIC METABOLIC PANEL      Component Value Range   Sodium 137  135 - 145 (mEq/L)   Potassium 3.9  3.5 - 5.1 (mEq/L)   Chloride 103  96 - 112 (mEq/L)   CO2 29  19 - 32 (mEq/L)   Glucose, Bld 138 (*) 70 - 99 (mg/dL)   BUN 11  6 - 23 (mg/dL)   Creatinine, Ser 1.02  0.50 - 1.10 (mg/dL)   Calcium 8.6  8.4 - 10.5 (mg/dL)   GFR calc non Af Amer 62 (*) >90 (mL/min)   GFR calc Af Amer 72 (*) >90 (mL/min)  CBC      Component Value Range   WBC 6.3  4.0 - 10.5 (K/uL)   RBC 2.99 (*) 3.87 - 5.11 (MIL/uL)   Hemoglobin 9.6 (*) 12.0 - 15.0 (g/dL)   HCT 28.0  (*) 36.0 - 46.0 (%)   MCV 93.6  78.0 - 100.0 (fL)   MCH 32.1  26.0 - 34.0 (pg)   MCHC 34.3  30.0 - 36.0 (g/dL)   RDW 13.2  11.5 - 15.5 (%)   Platelets 188  150 - 400 (K/uL)  BASIC METABOLIC PANEL      Component Value Range   Sodium 133 (*) 135 - 145 (mEq/L)   Potassium 3.3 (*) 3.5 - 5.1 (mEq/L)   Chloride 100  96 - 112 (mEq/L)   CO2 27  19 - 32 (mEq/L)   Glucose, Bld 127 (*) 70 - 99 (mg/dL)   BUN 7  6 - 23 (mg/dL)   Creatinine, Ser 0.97  0.50 - 1.10 (mg/dL)   Calcium 8.6  8.4 - 10.5 (mg/dL)   GFR calc non Af Amer 66 (*) >90 (  mL/min)   GFR calc Af Amer 76 (*) >90 (mL/min)  CBC      Component Value Range   WBC 7.7  4.0 - 10.5 (K/uL)   RBC 2.93 (*) 3.87 - 5.11 (MIL/uL)   Hemoglobin 9.3 (*) 12.0 - 15.0 (g/dL)   HCT 27.3 (*) 36.0 - 46.0 (%)   MCV 93.2  78.0 - 100.0 (fL)   MCH 31.7  26.0 - 34.0 (pg)   MCHC 34.1  30.0 - 36.0 (g/dL)   RDW 13.3  11.5 - 15.5 (%)   Platelets 199  150 - 400 (K/uL)    Exam: Neurovascular intact Sensation intact distally Incision - clean, dry, no drainage Motor function intact - moving foot and toes well on exam.   Assessment/Plan: 3 Days Post-Op Procedure(s) (LRB): TOTAL KNEE ARTHROPLASTY (Left) Procedure(s) (LRB): TOTAL KNEE ARTHROPLASTY (Left) Past Medical History  Diagnosis Date  . Migraine without aura   . Menopausal syndrome   . Obese   . Arthritis   . Depression   . PONV (postoperative nausea and vomiting)   . Heart murmur     echo 10/20/2011  . GERD (gastroesophageal reflux disease)   . Plantar warts   . Postop Acute blood loss anemia 11/05/2011   Active Problems:  Postop Hyponatremia  Postop Hypokalemia  Postop Acute blood loss anemia   Up with therapy D/C IV fluids Discharge to SNF Diet - regular Follow up - in 2 weeks Activity - WBAT Condition Upon Discharge - Good D/C Meds - See DC Summary DVT Prophylaxis - Xarelto Protocol   [name] 11/05/2011, 8:13 AM

## 2011-11-05 NOTE — Progress Notes (Signed)
CARE MANAGEMENT NOTE 11/05/2011  Patient:  [name], [name]   Account Number:  [id number]  Date Initiated:  11/05/2011  Documentation initiated by:  Sherrin Daisy  Subjective/Objective Assessment:   dx osteoarthritris left knee; total knee replacemnt     Action/Plan:   st sn[facility]   Anticipated DC Date:  11/05/2011   Anticipated DC Plan:  SKILLED NURSING FACILITY  In-house referral  Clinical Social Worker      DC Planning Services  NA      [facility] Choice  NA   Choice offered to / List presented to:  NA   DME arranged  NA      DME agency  NA     [place] arranged  NA      [place] agency  NA   Status of service:  Completed, signed off Medicare Important Message given?  NO (If response is "NO", the following Medicare IM given date fields will be blank) Date Medicare IM given:   Date Additional Medicare IM given:    Discharge Disposition:  [place]

## 2011-11-05 NOTE — Progress Notes (Signed)
Pt will be d/c to [facility] today via [facility] transport for [facility].  Werner Lean  LCSW  [phone]

## 2011-11-18 ENCOUNTER — Encounter (HOSPITAL_COMMUNITY): Payer: Self-pay | Admitting: Orthopedic Surgery

## 2011-12-17 ENCOUNTER — Telehealth: Payer: Self-pay | Admitting: *Deleted

## 2011-12-17 NOTE — Telephone Encounter (Signed)
See note below

## 2011-12-17 NOTE — Telephone Encounter (Signed)
Ok to refer to [facility] for migraine management

## 2011-12-17 NOTE — Telephone Encounter (Signed)
[name] called and just recently had a knee replacement.  [facility]  Her Dr. Left the practice and the Dr that has been assigned to her wanted to put her on experimental drugs which her insurance will not cover.  She is wanted to see if Dr Coralyn Mark will follow her with her Effexor and Topamax.  Will discuss with MD and get back with pt.

## 2011-12-18 ENCOUNTER — Telehealth: Payer: Self-pay | Admitting: *Deleted

## 2011-12-18 NOTE — Telephone Encounter (Signed)
LM on home phone that Dr Coralyn Mark was not able to follow her for her migraines, that is not her specialty.  She will need to [facility] or may opt to see Elvina Mattes, NP in [place] office to follow her with her migraines.

## 2011-12-29 ENCOUNTER — Encounter: Payer: Self-pay | Admitting: Internal Medicine

## 2011-12-29 ENCOUNTER — Ambulatory Visit (INDEPENDENT_AMBULATORY_CARE_PROVIDER_SITE_OTHER): Payer: 59 | Admitting: Internal Medicine

## 2011-12-29 VITALS — BP 119/84 | HR 82 | Temp 97.8°F | Ht 67.0 in | Wt 201.8 lb

## 2011-12-29 DIAGNOSIS — J329 Chronic sinusitis, unspecified: Secondary | ICD-10-CM

## 2011-12-29 DIAGNOSIS — J40 Bronchitis, not specified as acute or chronic: Secondary | ICD-10-CM

## 2011-12-29 MED ORDER — AZITHROMYCIN 250 MG PO TABS: ORAL_TABLET | ORAL | Status: AC

## 2011-12-29 NOTE — Progress Notes (Signed)
Subjective:    Patient ID: [name], female    DOB: [birth year], 51 y.o.   MRN: [id number]  HPI  [name] is here for acute visit.  5 days of nasal congestion, cough productive of green sputum,  No fever, no chest pain.    Allergies  Allergen Reactions  . Codeine Nausea And Vomiting  . Onion Other (See Comments)    Headache    Past Medical History  Diagnosis Date  . Migraine without aura   . Menopausal syndrome   . Obese   . Arthritis   . Depression   . PONV (postoperative nausea and vomiting)   . Heart murmur     echo 10/20/2011  . GERD (gastroesophageal reflux disease)   . Plantar warts   . Postop Acute blood loss anemia 11/05/2011   Past Surgical History  Procedure Date  . Tonsillectomy 1979  . Knee arthroscopy 2005  . 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  . Total knee arthroplasty 11/02/2011    Procedure: TOTAL KNEE ARTHROPLASTY;  Surgeon: [name], MD;  Location: [facility];  Service: Orthopedics;  Laterality: Left;   History   Social History  . Marital Status: Single    Spouse Name: N/A    Number of Children: N/A  . Years of Education: N/A   Occupational History  . Not on file.   Social History Main Topics  . Smoking status: Never Smoker   . Smokeless tobacco: Not on file  . Alcohol Use: 4.2 oz/week    7 Glasses of wine per week     occassionally  . Drug Use: No  . Sexually Active: Not Currently   Other Topics Concern  . Not on file   Social History Narrative  . No narrative on file   Family History  Problem Relation Age of Onset  . Heart disease Mother     mitral valve prolapse  . Lung cancer Father   . Thyroid disease Father   . Thyroid disease Sister   . Heart disease Maternal Aunt   . Heart disease Maternal Uncle   . Thyroid disease Paternal Aunt   . Heart disease Maternal Grandfather   . Heart disease Paternal Grandfather   . Heart disease Maternal Uncle   . Heart disease Maternal Uncle   . Heart disease  Maternal Uncle   . Diabetes Maternal Aunt    Patient Active Problem List  Diagnoses  . Arthritis  . Migraine without aura  . Depression  . Menopausal syndrome  . Abnormal Pap smear  . Cardiac murmur  . GERD (gastroesophageal reflux disease)  . Urticaria  . Postop Hyponatremia  . Postop Hypokalemia  . Postop Acute blood loss anemia   Current Outpatient Prescriptions on File Prior to Visit  Medication Sig Dispense Refill  . acetaminophen (TYLENOL) 325 MG tablet Take 2 tablets (650 mg total) by mouth every 6 (six) hours as needed (or Fever >/= 101).  60 tablet  0  . cetirizine (ZYRTEC) 10 MG tablet Take 10 mg by mouth daily.      . diphenhydramine-acetaminophen (TYLENOL PM) 25-500 MG TABS Take 2 tablets by mouth at bedtime.      Marland Kitchen eletriptan (RELPAX) 40 MG tablet One tablet by mouth as needed for migraine headache.  If the headache improves and then returns, dose may be repeated after 2 hours have elapsed since first dose (do not exceed 80 mg per day). may repeat in 2 hours if  necessary. Headache       . omeprazole (PRILOSEC OTC) 20 MG tablet Take 20 mg by mouth daily.      . rivaroxaban (XARELTO) 10 MG TABS tablet Take 1 tablet (10 mg total) by mouth daily with breakfast.  18 tablet  0  . topiramate (TOPAMAX) 100 MG tablet Take 150 mg by mouth at bedtime.       Marland Kitchen venlafaxine (EFFEXOR-XR) 75 MG 24 hr capsule Take 225 mg by mouth daily after breakfast.           Review of Systems See HPI    Objective:   Physical Exam Physical Exam  Nursing note and vitals reviewed.  Constitutional: She is oriented to person, place, and time. She appears well-developed and well-nourished. She is cooperative.  HENT:  Head: Normocephalic and atraumatic. Tm's serous effusion bilaterally Nose: Mucosal edema present.  Eyes: Conjunctivae and EOM are normal. Pupils are equal, round, and reactive to light.  Neck: Neck supple.  Cardiovascular: Regular rhythm, normal heart sounds, intact distal pulses  and normal pulses. Exam reveals no gallop and no friction rub.  No murmur heard.  Pulmonary/Chest: She has no wheezes. She has rhonchi. She has no rales.  Neurological: She is alert and oriented to person, place, and time.  Skin: Skin is warm and dry. No abrasion, no bruising, no ecchymosis and no rash noted. No cyanosis. Nails show no clubbing.  Psychiatric: She has a normal mood and affect. Her speech is normal and behavior is normal.        Assessment & Plan:  1)  sinutiits  z-=pak afrin 2) Bronchitis  See above Delsym for cough prn

## 2011-12-29 NOTE — Progress Notes (Signed)
Pt here for c/o head/chest congestion, with a productive greenish cough, ears hurt, fever, hives, sinus headache- at first thought was allergies started on 12/22/11 , also c/o neck and body aches. States has been about a year or more since last period

## 2011-12-29 NOTE — Patient Instructions (Signed)
Call iuf not better

## 2012-12-08 ENCOUNTER — Encounter: Payer: Self-pay | Admitting: Internal Medicine

## 2012-12-08 ENCOUNTER — Telehealth: Payer: Self-pay | Admitting: Internal Medicine

## 2012-12-08 ENCOUNTER — Ambulatory Visit (INDEPENDENT_AMBULATORY_CARE_PROVIDER_SITE_OTHER): Payer: 59 | Admitting: Internal Medicine

## 2012-12-08 VITALS — BP 144/89 | HR 84 | Temp 97.5°F | Resp 18 | Ht 67.0 in | Wt 204.0 lb

## 2012-12-08 DIAGNOSIS — J329 Chronic sinusitis, unspecified: Secondary | ICD-10-CM

## 2012-12-08 DIAGNOSIS — N939 Abnormal uterine and vaginal bleeding, unspecified: Secondary | ICD-10-CM

## 2012-12-08 DIAGNOSIS — D62 Acute posthemorrhagic anemia: Secondary | ICD-10-CM

## 2012-12-08 DIAGNOSIS — N898 Other specified noninflammatory disorders of vagina: Secondary | ICD-10-CM

## 2012-12-08 LAB — COMPREHENSIVE METABOLIC PANEL
AST: 55 U/L — ABNORMAL HIGH (ref 0–37)
Alkaline Phosphatase: 68 U/L (ref 39–117)
BUN: 14 mg/dL (ref 6–23)
Calcium: 9.5 mg/dL (ref 8.4–10.5)
Creat: 0.99 mg/dL (ref 0.50–1.10)

## 2012-12-08 LAB — CBC WITH DIFFERENTIAL/PLATELET
Basophils Absolute: 0 10*3/uL (ref 0.0–0.1)
Basophils Relative: 1 % (ref 0–1)
Eosinophils Relative: 3 % (ref 0–5)
HCT: 41.7 % (ref 36.0–46.0)
MCH: 30.9 pg (ref 26.0–34.0)
MCHC: 33.6 g/dL (ref 30.0–36.0)
MCV: 92.1 fL (ref 78.0–100.0)
Monocytes Absolute: 0.4 10*3/uL (ref 0.1–1.0)
RDW: 13.7 % (ref 11.5–15.5)

## 2012-12-08 MED ORDER — LORATADINE-PSEUDOEPHEDRINE ER 10-240 MG PO TB24: 1.0000 | ORAL_TABLET | Freq: Every day | ORAL | Status: DC

## 2012-12-08 MED ORDER — AZITHROMYCIN 250 MG PO TABS: ORAL_TABLET | ORAL | Status: DC

## 2012-12-08 MED ORDER — OLOPATADINE HCL 0.1 % OP SOLN: 1.0000 [drp] | Freq: Two times a day (BID) | OPHTHALMIC | Status: DC

## 2012-12-08 NOTE — Progress Notes (Signed)
Subjective:    Patient ID: [name], female    DOB: Jun 22, 1961, 52 y.o.   MRN: [id number]  HPI [name] is here for acute visit.  She reports several weeks of sinus pressure,  No having yellow green nasal discharge.  Some pain when she bites down.  No fever mild cough  No sob  She does have itchy eyes.    [name] also report she has had some vaginal spotting  Last few months.  Her LMP was 4-5 years ago.   She has not seen her GYN Dr. Allena Katz in a while.  "I know I have let it go"    Allergies  Allergen Reactions  . Codeine Nausea And Vomiting  . Onion Other (See Comments)    Headache    Past Medical History  Diagnosis Date  . Migraine without aura   . Menopausal syndrome   . Obese   . Arthritis   . Depression   . PONV (postoperative nausea and vomiting)   . Heart murmur     echo 10/20/2011  . GERD (gastroesophageal reflux disease)   . Plantar warts   . Postop Acute blood loss anemia 11/05/2011   Past Surgical History  Procedure Laterality Date  . Tonsillectomy  1979  . Knee arthroscopy  2005  . Brain surgery  15 yrs.ago    calcinatied bone from forehead  . Total knee arthroplasty  11/02/2011    Procedure: TOTAL KNEE ARTHROPLASTY;  Surgeon: Loanne Drilling, MD;  Location: [facility];  Service: Orthopedics;  Laterality: Left;   History   Social History  . Marital Status: Single    Spouse Name: N/A    Number of Children: N/A  . Years of Education: N/A   Occupational History  . Not on file.   Social History Main Topics  . Smoking status: Never Smoker   . Smokeless tobacco: Not on file  . Alcohol Use: 4.2 oz/week    7 Glasses of wine per week     Comment: occassionally  . Drug Use: No  . Sexually Active: Not Currently   Other Topics Concern  . Not on file   Social History Narrative  . No narrative on file   Family History  Problem Relation Age of Onset  . Heart disease Mother     mitral valve prolapse  . Lung cancer Father   . Thyroid disease Father   .  Thyroid disease Sister   . Heart disease Maternal Aunt   . Heart disease Maternal Uncle   . Thyroid disease Paternal Aunt   . Heart disease Maternal Grandfather   . Heart disease Paternal Grandfather   . Heart disease Maternal Uncle   . Heart disease Maternal Uncle   . Heart disease Maternal Uncle   . Diabetes Maternal Aunt    Patient Active Problem List  Diagnosis  . Arthritis  . Migraine without aura  . Depression  . Menopausal syndrome  . Abnormal Pap smear  . Cardiac murmur  . GERD (gastroesophageal reflux disease)  . Urticaria  . Postop Hyponatremia  . Postop Hypokalemia  . Postop Acute blood loss anemia  . Vaginal spotting   Current Outpatient Prescriptions on File Prior to Visit  Medication Sig Dispense Refill  . cetirizine (ZYRTEC) 10 MG tablet Take 10 mg by mouth daily.      Marland Kitchen omeprazole (PRILOSEC OTC) 20 MG tablet Take 20 mg by mouth daily.      . Pseudoephedrine-Acetaminophen (ALKA-SELTZER PLUS COLD/SINUS PO) Take  1 tablet by mouth as needed.      . topiramate (TOPAMAX) 100 MG tablet Take 400 mg by mouth at bedtime.       Marland Kitchen venlafaxine (EFFEXOR-XR) 75 MG 24 hr capsule Take 225 mg by mouth daily after breakfast.       . eletriptan (RELPAX) 40 MG tablet One tablet by mouth as needed for migraine headache.  If the headache improves and then returns, dose may be repeated after 2 hours have elapsed since first dose (do not exceed 80 mg per day). may repeat in 2 hours if necessary. Headache       . oxyCODONE-acetaminophen (PERCOCET) 5-325 MG per tablet Take 1 tablet by mouth every 4 (four) hours as needed.       No current facility-administered medications on file prior to visit.       Review of Systems See HPI    Objective:   Physical Exam Physical Exam  Constitutional: She is oriented to person, place, and time. She appears well-developed and well-nourished. She is cooperative.  HENT:  Head: Normocephalic and atraumatic.  Right Ear: A middle ear effusion is  present.  Left Ear: A middle ear effusion is present.  Nose: Mucosal edema present. Right sinus exhibits maxillary sinus tenderness. Left sinus exhibits maxillary sinus tenderness.  Mouth/Throat: Posterior oropharyngeal erythema present.  Serous effusion bilaterally  Eyes: Conjunctivae slight redness.   EOM are normal. Pupils are equal, round, and reactive to light.  Neck: Neck supple. Carotid bruit is not present. No mass present.  Cardiovascular: Regular rhythm, normal heart sounds, intact distal pulses and normal pulses. Exam reveals no gallop and no friction rub.  No murmur heard.  Pulmonary/Chest: Breath sounds normal. She has no wheezes. She has no rhonchi. She has no rales.  Neurological: She is alert and oriented to person, place, and time.  Skin: Skin is warm and dry. No abrasion, no bruising, no ecchymosis and no rash noted. No cyanosis. Nails show no clubbing.  Psychiatric: She has a normal mood and affect. Her speech is normal and behavior is normal.         Assessment & Plan:  Sinusitis  Will give zpack  Allergic conjunctivitis  :  OK for patanol 1 gtt bid  POst op anemia  Will check labs today  Post menopausal vaginal spotting.  Pt wishes a gyn group in [place].    Will refer to MD at Dr. Mariana Arn office .  Pt is aware she needs GYN eval and may need possible biopsy.    Keep CPE appt with me

## 2012-12-08 NOTE — Addendum Note (Signed)
Addended by: Emi Belfast D on: 12/08/2012 10:51 AM   Modules accepted: Orders

## 2012-12-08 NOTE — Telephone Encounter (Signed)
Pt scheduled with Dr Linda Hedges on Wednesday December 14, 2012 at 11:40 am.  Will fax notes to office at fax number [phone].

## 2012-12-12 ENCOUNTER — Telehealth: Payer: Self-pay | Admitting: *Deleted

## 2012-12-12 ENCOUNTER — Encounter: Payer: Self-pay | Admitting: *Deleted

## 2012-12-12 NOTE — Telephone Encounter (Signed)
Pt states that she has used two bottles of Delsym with no relief would like something a little stronger.

## 2012-12-13 MED ORDER — HYDROCOD POLST-CHLORPHEN POLST 10-8 MG/5ML PO LQCR: 5.0000 mL | Freq: Two times a day (BID) | ORAL | Status: DC | PRN

## 2012-12-13 NOTE — Telephone Encounter (Signed)
[name]  OK to call in Tussionex for her  Call pt and advise if cough not better in 3-5 fdays she needs to have office visit for CXR  I put med order to be called in

## 2012-12-13 NOTE — Telephone Encounter (Signed)
Tussionex caled in to [facility] pt notified

## 2013-03-06 ENCOUNTER — Other Ambulatory Visit: Payer: Self-pay | Admitting: Obstetrics & Gynecology

## 2013-03-06 DIAGNOSIS — R928 Other abnormal and inconclusive findings on diagnostic imaging of breast: Secondary | ICD-10-CM

## 2013-03-15 ENCOUNTER — Ambulatory Visit
Admission: RE | Admit: 2013-03-15 | Discharge: 2013-03-15 | Disposition: A | Discharge status: Home / Self Care | Payer: 59 | Source: Ambulatory Visit | Attending: Obstetrics & Gynecology | Admitting: Obstetrics & Gynecology

## 2013-03-15 ENCOUNTER — Ambulatory Visit (INDEPENDENT_AMBULATORY_CARE_PROVIDER_SITE_OTHER): Payer: 59 | Admitting: Internal Medicine

## 2013-03-15 ENCOUNTER — Encounter: Payer: Self-pay | Admitting: Internal Medicine

## 2013-03-15 VITALS — BP 136/80 | HR 92 | Temp 98.0°F | Resp 18 | Ht 66.0 in | Wt 199.0 lb

## 2013-03-15 DIAGNOSIS — F329 Major depressive disorder, single episode, unspecified: Secondary | ICD-10-CM

## 2013-03-15 DIAGNOSIS — M1990 Unspecified osteoarthritis, unspecified site: Secondary | ICD-10-CM

## 2013-03-15 DIAGNOSIS — R7401 Elevation of levels of liver transaminase levels: Secondary | ICD-10-CM

## 2013-03-15 DIAGNOSIS — N898 Other specified noninflammatory disorders of vagina: Secondary | ICD-10-CM

## 2013-03-15 DIAGNOSIS — F32A Depression, unspecified: Secondary | ICD-10-CM

## 2013-03-15 DIAGNOSIS — Z0000 Encounter for general adult medical examination without abnormal findings: Secondary | ICD-10-CM

## 2013-03-15 DIAGNOSIS — R928 Other abnormal and inconclusive findings on diagnostic imaging of breast: Secondary | ICD-10-CM

## 2013-03-15 DIAGNOSIS — N939 Abnormal uterine and vaginal bleeding, unspecified: Secondary | ICD-10-CM

## 2013-03-15 DIAGNOSIS — R011 Cardiac murmur, unspecified: Secondary | ICD-10-CM

## 2013-03-15 DIAGNOSIS — M129 Arthropathy, unspecified: Secondary | ICD-10-CM

## 2013-03-15 NOTE — Patient Instructions (Signed)
Dr Carollee Massed  [phone] Dr. Emerson Monte  [phone]  Call for appoitment

## 2013-03-15 NOTE — Progress Notes (Signed)
Subjective:    Patient ID: [name], female    DOB: [birth year], 52 y.o.   MRN: [id number]  HPI [name] is here for CPE  She is tearful during interview.  She has job stress and notices that she cannot concentrate and has trouble processing.  She is wondering if the Topamax 300 mg (for her headaches) may be contributing to  this.  She does have depression that she startes Effexor helps   She had a screening mm at her GYN (Dr. Lynnette Benson) office -  She tells me she is scheduled for a diagnostic mm  She is strapped financially.    She does not like her headache doctor   L knee is better S/P  TKR .  She notes that she has urge incontinence.  No stress symptoms  No dysuria  See tranaminases she does admit to having wine or ETOH Most days of week  Allergies  Allergen Reactions  . Codeine Nausea And Vomiting  . Onion Other (See Comments)    Headache    Past Medical History  Diagnosis Date  . Migraine without aura   . Menopausal syndrome   . Obese   . Arthritis   . Depression   . PONV (postoperative nausea and vomiting)   . Heart murmur     echo 10/20/2011  . GERD (gastroesophageal reflux disease)   . Plantar warts   . Postop Acute blood loss anemia 11/05/2011   Past Surgical History  Procedure Laterality Date  . Tonsillectomy  1979  . Knee arthroscopy  2005  . Brain surgery  15 yrs.ago    calcinatied bone from forehead  . Total knee arthroplasty  11/02/2011    Procedure: TOTAL KNEE ARTHROPLASTY;  Surgeon: Danielle Alf, MD;  Location: [facility];  Service: Orthopedics;  Laterality: Left;   History   Social History  . Marital Status: Single    Spouse Name: N/A    Number of Children: N/A  . Years of Education: N/A   Occupational History  . Not on file.   Social History Main Topics  . Smoking status: Never Smoker   . Smokeless tobacco: Not on file  . Alcohol Use: 4.2 oz/week    7 Glasses of wine per week     Comment: occassionally  . Drug Use: No  . Sexually  Active: Not Currently   Other Topics Concern  . Not on file   Social History Narrative  . No narrative on file   Family History  Problem Relation Age of Onset  . Heart disease Mother     mitral valve prolapse  . Lung cancer Father   . Thyroid disease Father   . Thyroid disease Sister   . Heart disease Maternal Aunt   . Heart disease Maternal Uncle   . Thyroid disease Paternal Aunt   . Heart disease Maternal Grandfather   . Heart disease Paternal Grandfather   . Heart disease Maternal Uncle   . Heart disease Maternal Uncle   . Heart disease Maternal Uncle   . Diabetes Maternal Aunt    Patient Active Problem List   Diagnosis Date Noted  . Elevated transaminase measurement 03/15/2013  . Vaginal spotting 12/08/2012  . Postop Hyponatremia 11/05/2011  . Postop Hypokalemia 11/05/2011  . Postop Acute blood loss anemia 11/05/2011  . Abnormal Pap smear 10/13/2011  . Cardiac murmur 10/13/2011  . GERD (gastroesophageal reflux disease) 10/13/2011  . Urticaria 10/13/2011  . Arthritis   . Migraine without aura   .  Depression   . Menopausal syndrome    Current Outpatient Prescriptions on File Prior to Visit  Medication Sig Dispense Refill  . cetirizine (ZYRTEC) 10 MG tablet Take 10 mg by mouth daily.      Marland Kitchen omeprazole (PRILOSEC OTC) 20 MG tablet Take 20 mg by mouth daily.      Marland Kitchen topiramate (TOPAMAX) 100 MG tablet Take 400 mg by mouth at bedtime.       Marland Kitchen venlafaxine (EFFEXOR-XR) 75 MG 24 hr capsule Take 225 mg by mouth daily after breakfast.       . olopatadine (PATANOL) 0.1 % ophthalmic solution Place 1 drop into both eyes 2 (two) times daily.  5 mL  1   No current facility-administered medications on file prior to visit.      Review of Systems  Genitourinary: Positive for frequency.  Psychiatric/Behavioral: Positive for decreased concentration. Negative for suicidal ideas.       Objective:   Physical Exam  Physical Exam  Nursing note and vitals reviewed.   Constitutional: She is oriented to person, place, and time. She appears well-developed and well-nourished.  HENT:  Head: Normocephalic and atraumatic.  Right Ear: Tympanic membrane and ear canal normal. No drainage. Tympanic membrane is not injected and not erythematous.  Left Ear: Tympanic membrane and ear canal normal. No drainage. Tympanic membrane is not injected and not erythematous.  Nose: Nose normal. Right sinus exhibits no maxillary sinus tenderness and no frontal sinus tenderness. Left sinus exhibits no maxillary sinus tenderness and no frontal sinus tenderness.  Mouth/Throat: Oropharynx is clear and moist. No oral lesions. No oropharyngeal exudate.  Eyes: Conjunctivae and EOM are normal. Pupils are equal, round, and reactive to light.  Neck: Normal range of motion. Neck supple. No JVD present. Carotid bruit is not present. No mass and no thyromegaly present.  Cardiovascular: Normal rate, regular rhythm, S1 normal, S2 normal and intact distal pulses. Exam reveals no gallop and no friction rub.  No murmur heard.  Pulses:  Carotid pulses are 2+ on the right side, and 2+ on the left side.  Dorsalis pedis pulses are 2+ on the right side, and 2+ on the left side.  No carotid bruit. No LE edema  Pulmonary/Chest: Breath sounds normal. She has no wheezes. She has no rales. She exhibits no tenderness.  Abdominal: Soft. Bowel sounds are normal. She exhibits no distension and no mass. There is no hepatosplenomegaly. There is no tenderness. There is no CVA tenderness.  Musculoskeletal: Normal range of motion.  No active synovitis to joints.  Lymphadenopathy:  She has no cervical adenopathy.  She has no axillary adenopathy.  Right: No inguinal and no supraclavicular adenopathy present.  Left: No inguinal and no supraclavicular adenopathy present.  Neurological: She is alert and oriented to person, place, and time. She has normal strength and normal reflexes. She displays no tremor. No cranial  nerve deficit or sensory deficit. Coordination and gait normal.  Skin: Skin is warm and dry. No rash noted. No cyanosis. Nails show no clubbing.  She does have a darkened lesion  L temple   Psychiatric: She has a normal mood and affect. Her speech is normal and behavior is normal. Cognition and memory are normal.         Assessment & Plan:  Health maintenance:  Mm done at  Dr. Lynnette Benson office.  I counseled that she needs colonoscopy but she declines due to lack of finances.  Pap done at GYN  Elevated tranaminases  Will rechek  with ANA today  L breast nodule   Diagnostic mm pending  Decreased concentration  I gave pt phone number to Dr. Toy Care and Dr. Caprice Beaver and instructed pt  To call for evaluation.    Migraine headaches  I offered to refer to a different neurologist but pt declines  History of cardiac mumur  No murmur heard today.  Trivial mitral regurg on ECHO  Facial skin lesion.  Pt states she will make appt with her  dermatologist   Dr. Valli Glance

## 2013-03-16 ENCOUNTER — Encounter: Payer: Self-pay | Admitting: *Deleted

## 2013-03-16 LAB — LIPID PANEL
Cholesterol: 190 mg/dL (ref 0–200)
HDL: 57 mg/dL (ref >39)
Total CHOL/HDL Ratio: 3.3 Ratio
VLDL: 16 mg/dL (ref 0–40)

## 2013-03-16 LAB — HEPATIC FUNCTION PANEL
ALT: 14 U/L (ref 0–35)
AST: 15 U/L (ref 0–37)
Bilirubin, Direct: 0.1 mg/dL (ref 0.0–0.3)
Indirect Bilirubin: 0.3 mg/dL (ref 0.0–0.9)

## 2013-03-17 LAB — ANTI-NUCLEAR AB-TITER (ANA TITER)

## 2013-03-20 ENCOUNTER — Telehealth: Payer: Self-pay | Admitting: Internal Medicine

## 2013-03-20 DIAGNOSIS — R768 Other specified abnormal immunological findings in serum: Secondary | ICD-10-CM | POA: Insufficient documentation

## 2013-03-20 NOTE — Telephone Encounter (Signed)
Spoke with pt and informed of positive ANA.   Liver function now normal and no indication of autoimmune hepatitis.     [place] positive.  Will moniter and recheck in 6-12 monhts for now.  Advised pt if rash,  Joint redness or swelling,  Alopecia  To see me in office

## 2013-03-22 ENCOUNTER — Other Ambulatory Visit: Payer: Self-pay | Admitting: Obstetrics & Gynecology

## 2013-03-22 DIAGNOSIS — N6320 Unspecified lump in the left breast, unspecified quadrant: Secondary | ICD-10-CM

## 2013-03-23 ENCOUNTER — Encounter: Payer: 59 | Admitting: Internal Medicine

## 2013-04-07 ENCOUNTER — Ambulatory Visit
Admission: RE | Admit: 2013-04-07 | Discharge: 2013-04-07 | Disposition: A | Discharge status: Home / Self Care | Payer: 59 | Source: Ambulatory Visit | Attending: Obstetrics & Gynecology | Admitting: Obstetrics & Gynecology

## 2013-04-07 ENCOUNTER — Other Ambulatory Visit: Payer: Self-pay | Admitting: Obstetrics & Gynecology

## 2013-04-07 DIAGNOSIS — N6320 Unspecified lump in the left breast, unspecified quadrant: Secondary | ICD-10-CM

## 2013-04-10 ENCOUNTER — Encounter: Payer: Self-pay | Admitting: Internal Medicine

## 2013-04-10 DIAGNOSIS — D242 Benign neoplasm of left breast: Secondary | ICD-10-CM | POA: Insufficient documentation

## 2013-10-04 ENCOUNTER — Encounter: Payer: Self-pay | Admitting: Internal Medicine

## 2013-10-04 ENCOUNTER — Ambulatory Visit (INDEPENDENT_AMBULATORY_CARE_PROVIDER_SITE_OTHER): Payer: 59 | Admitting: Internal Medicine

## 2013-10-04 VITALS — BP 137/80 | HR 79 | Temp 97.7°F | Resp 18 | Wt 211.0 lb

## 2013-10-04 DIAGNOSIS — R51 Headache: Secondary | ICD-10-CM

## 2013-10-04 DIAGNOSIS — R5383 Other fatigue: Secondary | ICD-10-CM

## 2013-10-04 DIAGNOSIS — R5381 Other malaise: Secondary | ICD-10-CM

## 2013-10-04 DIAGNOSIS — R040 Epistaxis: Secondary | ICD-10-CM

## 2013-10-04 LAB — CBC WITH DIFFERENTIAL/PLATELET
Basophils Absolute: 0 10*3/uL (ref 0.0–0.1)
Basophils Relative: 1 % (ref 0–1)
Eosinophils Absolute: 0.2 10*3/uL (ref 0.0–0.7)
Eosinophils Relative: 2 % (ref 0–5)
HCT: 42.1 % (ref 36.0–46.0)
HEMOGLOBIN: 14.4 g/dL (ref 12.0–15.0)
LYMPHS ABS: 2.6 10*3/uL (ref 0.7–4.0)
LYMPHS PCT: 41 % (ref 12–46)
MCH: 31.2 pg (ref 26.0–34.0)
MCHC: 34.2 g/dL (ref 30.0–36.0)
MCV: 91.1 fL (ref 78.0–100.0)
MONOS PCT: 7 % (ref 3–12)
Monocytes Absolute: 0.4 10*3/uL (ref 0.1–1.0)
NEUTROS PCT: 49 % (ref 43–77)
Neutro Abs: 3.2 10*3/uL (ref 1.7–7.7)
PLATELETS: 287 10*3/uL (ref 150–400)
RBC: 4.62 MIL/uL (ref 3.87–5.11)
RDW: 14.4 % (ref 11.5–15.5)
WBC: 6.3 10*3/uL (ref 4.0–10.5)

## 2013-10-04 LAB — COMPREHENSIVE METABOLIC PANEL
ALBUMIN: 4.4 g/dL (ref 3.5–5.2)
ALT: 14 U/L (ref 0–35)
AST: 17 U/L (ref 0–37)
Alkaline Phosphatase: 64 U/L (ref 39–117)
BUN: 20 mg/dL (ref 6–23)
CALCIUM: 9.4 mg/dL (ref 8.4–10.5)
CHLORIDE: 107 meq/L (ref 96–112)
CO2: 27 meq/L (ref 19–32)
Creat: 1.07 mg/dL (ref 0.50–1.10)
Glucose, Bld: 59 mg/dL — ABNORMAL LOW (ref 70–99)
Potassium: 4.4 mEq/L (ref 3.5–5.3)
SODIUM: 141 meq/L (ref 135–145)
TOTAL PROTEIN: 6.8 g/dL (ref 6.0–8.3)
Total Bilirubin: 0.4 mg/dL (ref 0.2–1.2)

## 2013-10-04 LAB — TSH: TSH: 1.427 u[IU]/mL (ref 0.350–4.500)

## 2013-10-04 MED ORDER — AZITHROMYCIN 250 MG PO TABS: ORAL_TABLET | ORAL | Status: DC

## 2013-10-04 NOTE — Patient Instructions (Signed)
Schedule CPE  Will set up referral to [facility] neurology

## 2013-10-04 NOTE — Progress Notes (Signed)
Subjective:    Patient ID: [name], female    DOB: [birth year], 53 y.o.   MRN: [id number]  HPI  [name] is here for acute visit.   Seh has been having nosebleed last 3 days  "gushing yesterday while driving."  Nasal congestion  No cough no fever.  Yellow nasal discharge  Chronic headache on Topamax.  She has seen Dr. Domingo Cocking in the past and would like to change neurologist.   See path She has fibroadenoma of left breast.  She reports area has not increased in size by her self exam  Allergies  Allergen Reactions  . Codeine Nausea And Vomiting  . Onion Other (See Comments)    Headache    Past Medical History  Diagnosis Date  . Migraine without aura   . Menopausal syndrome   . Obese   . Arthritis   . Depression   . PONV (postoperative nausea and vomiting)   . Heart murmur     echo 10/20/2011  . GERD (gastroesophageal reflux disease)   . Plantar warts   . Postop Acute blood loss anemia 11/05/2011   Past Surgical History  Procedure Laterality Date  . Tonsillectomy  1979  . Knee arthroscopy  2005  . Brain surgery  15 yrs.ago    calcinatied bone from forehead  . Total knee arthroplasty  11/02/2011    Procedure: TOTAL KNEE ARTHROPLASTY;  Surgeon: [name], MD;  Location: [facility];  Service: Orthopedics;  Laterality: Left;   History   Social History  . Marital Status: Single    Spouse Name: N/A    Number of Children: N/A  . Years of Education: N/A   Occupational History  . Not on file.   Social History Main Topics  . Smoking status: Never Smoker   . Smokeless tobacco: Not on file  . Alcohol Use: 4.2 oz/week    7 Glasses of wine per week     Comment: occassionally  . Drug Use: No  . Sexual Activity: Not Currently   Other Topics Concern  . Not on file   Social History Narrative  . No narrative on file   Family History  Problem Relation Age of Onset  . Heart disease Mother     mitral valve prolapse  . Lung cancer Father   . Thyroid disease  Father   . Thyroid disease Sister   . Heart disease Maternal Aunt   . Heart disease Maternal Uncle   . Thyroid disease Paternal Aunt   . Heart disease Maternal Grandfather   . Heart disease Paternal Grandfather   . Heart disease Maternal Uncle   . Heart disease Maternal Uncle   . Heart disease Maternal Uncle   . Diabetes Maternal Aunt    Patient Active Problem List   Diagnosis Date Noted  . Fibroadenoma of left breast 04/10/2013  . ANA positive 03/20/2013  . Elevated transaminase measurement 03/15/2013  . Vaginal spotting 12/08/2012  . Postop Hyponatremia 11/05/2011  . Postop Hypokalemia 11/05/2011  . Postop Acute blood loss anemia 11/05/2011  . Abnormal Pap smear 10/13/2011  . Cardiac murmur 10/13/2011  . GERD (gastroesophageal reflux disease) 10/13/2011  . Urticaria 10/13/2011  . Arthritis   . Migraine without aura   . Depression   . Menopausal syndrome    Current Outpatient Prescriptions on File Prior to Visit  Medication Sig Dispense Refill  . cetirizine (ZYRTEC) 10 MG tablet Take 10 mg by mouth daily.      Marland Kitchen topiramate (  TOPAMAX) 100 MG tablet Take 400 mg by mouth at bedtime.       Marland Kitchen venlafaxine (EFFEXOR-XR) 75 MG 24 hr capsule Take 225 mg by mouth daily after breakfast.       . HYDROcodone-acetaminophen (NORCO/VICODIN) 5-325 MG per tablet Take 1 tablet by mouth every 6 (six) hours as needed for pain.      Marland Kitchen olopatadine (PATANOL) 0.1 % ophthalmic solution Place 1 drop into both eyes 2 (two) times daily.  5 mL  1  . omeprazole (PRILOSEC OTC) 20 MG tablet Take 20 mg by mouth daily.       No current facility-administered medications on file prior to visit.      Review of Systems See HPI    Objective:   Physical Exam Physical Exam  Constitutional: She is oriented to person, place, and time. She appears well-developed and well-nourished. She is cooperative.  HENT:  Head: Normocephalic and atraumatic.  Right Ear: A middle ear effusion is present.  Left Ear: A  middle ear effusion is present.  Nose: Mucosal edema present. Right sinus exhibits maxillary sinus tenderness. Left sinus exhibits maxillary sinus tenderness.  No active bleeding  .  Area of old blood L nare Mouth/Throat: Posterior oropharyngeal erythema present.  Serous effusion bilaterally  Eyes: Conjunctivae and EOM are normal. Pupils are equal, round, and reactive to light.  Neck: Neck supple. Carotid bruit is not present. No mass present.  Cardiovascular: Regular rhythm, normal heart sounds, intact distal pulses and normal pulses. Exam reveals no gallop and no friction rub.  No murmur heard.  Pulmonary/Chest: Breath sounds normal. She has no wheezes. She has no rhonchi. She has no rales.  Neurological: She is alert and oriented to person, place, and time.  Skin: Skin is warm and dry. No abrasion, no bruising, no ecchymosis and no rash noted. No cyanosis. Nails show no clubbing.  Psychiatric: She has a normal mood and affect. Her speech is normal and behavior is normal.       Assessment & Plan:  Nosebleed.  Normal BP likely due to dryness  Advised saline nose rinses  Sinusitis   willl give z-pak  Chronic headaches.  She would like to come off Topamax and see a new neuroloigst  Will set up with [facility]  Schedule CPE   Will get labs today

## 2013-10-05 LAB — VITAMIN D 25 HYDROXY (VIT D DEFICIENCY, FRACTURES): VIT D 25 HYDROXY: 47 ng/mL (ref 30–89)

## 2013-10-06 ENCOUNTER — Encounter: Payer: Self-pay | Admitting: *Deleted

## 2013-10-30 ENCOUNTER — Ambulatory Visit: Payer: 59 | Admitting: Neurology

## 2013-11-13 ENCOUNTER — Encounter: Payer: 59 | Admitting: Internal Medicine

## 2013-11-15 ENCOUNTER — Ambulatory Visit: Payer: 59 | Admitting: Neurology

## 2014-03-12 ENCOUNTER — Encounter: Payer: Self-pay | Admitting: Internal Medicine

## 2014-03-12 ENCOUNTER — Ambulatory Visit (INDEPENDENT_AMBULATORY_CARE_PROVIDER_SITE_OTHER): Payer: BC Managed Care – PPO | Admitting: Internal Medicine

## 2014-03-12 VITALS — BP 122/81 | HR 73 | Resp 16 | Ht 67.0 in | Wt 214.0 lb

## 2014-03-12 DIAGNOSIS — Z139 Encounter for screening, unspecified: Secondary | ICD-10-CM

## 2014-03-12 DIAGNOSIS — R768 Other specified abnormal immunological findings in serum: Secondary | ICD-10-CM

## 2014-03-12 DIAGNOSIS — Z23 Encounter for immunization: Secondary | ICD-10-CM

## 2014-03-12 DIAGNOSIS — N898 Other specified noninflammatory disorders of vagina: Secondary | ICD-10-CM

## 2014-03-12 DIAGNOSIS — N950 Postmenopausal bleeding: Secondary | ICD-10-CM

## 2014-03-12 DIAGNOSIS — R894 Abnormal immunological findings in specimens from other organs, systems and tissues: Secondary | ICD-10-CM

## 2014-03-12 DIAGNOSIS — F32A Depression, unspecified: Secondary | ICD-10-CM

## 2014-03-12 DIAGNOSIS — N939 Abnormal uterine and vaginal bleeding, unspecified: Secondary | ICD-10-CM

## 2014-03-12 DIAGNOSIS — F329 Major depressive disorder, single episode, unspecified: Secondary | ICD-10-CM

## 2014-03-12 DIAGNOSIS — M129 Arthropathy, unspecified: Secondary | ICD-10-CM

## 2014-03-12 DIAGNOSIS — K921 Melena: Secondary | ICD-10-CM

## 2014-03-12 DIAGNOSIS — G43009 Migraine without aura, not intractable, without status migrainosus: Secondary | ICD-10-CM

## 2014-03-12 DIAGNOSIS — F3289 Other specified depressive episodes: Secondary | ICD-10-CM

## 2014-03-12 DIAGNOSIS — M1990 Unspecified osteoarthritis, unspecified site: Secondary | ICD-10-CM

## 2014-03-12 DIAGNOSIS — Z0000 Encounter for general adult medical examination without abnormal findings: Secondary | ICD-10-CM

## 2014-03-12 LAB — POCT URINALYSIS DIPSTICK
BILIRUBIN UA: NEGATIVE
Blood, UA: NEGATIVE
Glucose, UA: NEGATIVE
Ketones, UA: NEGATIVE
Leukocytes, UA: NEGATIVE
NITRITE UA: NEGATIVE
Protein, UA: NEGATIVE
SPEC GRAV UA: 1.01
UROBILINOGEN UA: NEGATIVE
pH, UA: 7

## 2014-03-12 NOTE — Patient Instructions (Addendum)
Set up colonoscopy with Dr. [facility]  -     Get  Records from Dr. Lavada Mesi  We referred pt

## 2014-03-12 NOTE — Progress Notes (Signed)
Subjective:    Patient ID: [name], female    DOB: [birth year], 53 y.o.   MRN: [id number]  HPI  [name] is here for CPE  She tells me soon her work will be less stressful as she will be travelling less.  Her company had a merger   HM:  Mm not due until August ,  Has not had colonoscopy  Pt is a non-smoker  Post menopausal Vaginal spotting   She was evaluated by GYN .  Had endometrial Bx but scanty specimen.  Dr. Lynnette Benson also advised pt to see her GI MD  (Dr. Valli Benson HP)  But pt did not do this.   She would like a different MD.  Pt. Tells me she will be making upcoming appt with her GYN   Minimal  Hyperlipidemia  Pt does report she sees occasional blood in toilet - not sure if coming from rectum  Again  Did not see GI Md as she was advised.  Blood only lasts 1-2 days  Headaches:  She had cancelled her appt with neurolgist but she states headaches less frequent   Positive ANA  With low titer  Will need to recheck this. :  Fiboradenoma of breast on BX.    Allergies  Allergen Reactions  . Codeine Nausea And Vomiting  . Onion Other (See Comments)    Headache    Past Medical History  Diagnosis Date  . Migraine without aura   . Menopausal syndrome   . Obese   . Arthritis   . Depression   . PONV (postoperative nausea and vomiting)   . Heart murmur     echo 10/20/2011  . GERD (gastroesophageal reflux disease)   . Plantar warts   . Postop Acute blood loss anemia 11/05/2011   Past Surgical History  Procedure Laterality Date  . Tonsillectomy  1979  . Knee arthroscopy  2005  . Brain surgery  15 yrs.ago    calcinatied bone from forehead  . Total knee arthroplasty  11/02/2011    Procedure: TOTAL KNEE ARTHROPLASTY;  Surgeon: [name], MD;  Location: [facility];  Service: Orthopedics;  Laterality: Left;   History   Social History  . Marital Status: Single    Spouse Name: N/A    Number of Children: N/A  . Years of Education: N/A   Occupational History  . Not on  file.   Social History Main Topics  . Smoking status: Never Smoker   . Smokeless tobacco: Not on file  . Alcohol Use: 4.2 oz/week    7 Glasses of wine per week     Comment: occassionally  . Drug Use: No  . Sexual Activity: Not Currently   Other Topics Concern  . Not on file   Social History Narrative  . No narrative on file   Family History  Problem Relation Age of Onset  . Heart disease Mother     mitral valve prolapse  . Lung cancer Father   . Thyroid disease Father   . Thyroid disease Sister   . Heart disease Maternal Aunt   . Heart disease Maternal Uncle   . Thyroid disease Paternal Aunt   . Heart disease Maternal Grandfather   . Heart disease Paternal Grandfather   . Heart disease Maternal Uncle   . Heart disease Maternal Uncle   . Heart disease Maternal Uncle   . Diabetes Maternal Aunt    Patient Active Problem List   Diagnosis Date Noted  . Fibroadenoma  of left breast 04/10/2013  . ANA positive 03/20/2013  . Elevated transaminase measurement 03/15/2013  . Vaginal spotting 12/08/2012  . Postop Hyponatremia 11/05/2011  . Postop Hypokalemia 11/05/2011  . Postop Acute blood loss anemia 11/05/2011  . Abnormal Pap smear 10/13/2011  . Cardiac murmur 10/13/2011  . GERD (gastroesophageal reflux disease) 10/13/2011  . Urticaria 10/13/2011  . Arthritis   . Migraine   . Depression   . Menopausal syndrome    Current Outpatient Prescriptions on File Prior to Visit  Medication Sig Dispense Refill  . azithromycin (ZITHROMAX) 250 MG tablet Take as directed  6 tablet  0  . cetirizine (ZYRTEC) 10 MG tablet Take 10 mg by mouth daily.      Marland Kitchen HYDROcodone-acetaminophen (NORCO/VICODIN) 5-325 MG per tablet Take 1 tablet by mouth every 6 (six) hours as needed for pain.      . Melatonin 3 MG CAPS Take 3 mg by mouth.      . MULTIPLE VITAMIN PO Take 1 tablet by mouth.      . naproxen sodium (ANAPROX) 220 MG tablet Take 220 mg by mouth 2 (two) times daily with a meal.      .  olopatadine (PATANOL) 0.1 % ophthalmic solution Place 1 drop into both eyes 2 (two) times daily.  5 mL  1  . omeprazole (PRILOSEC OTC) 20 MG tablet Take 20 mg by mouth daily.      Marland Kitchen topiramate (TOPAMAX) 100 MG tablet Take 400 mg by mouth at bedtime.       Marland Kitchen venlafaxine (EFFEXOR-XR) 75 MG 24 hr capsule Take 225 mg by mouth daily after breakfast.        No current facility-administered medications on file prior to visit.      Review of Systems  Respiratory: Negative for cough, choking, chest tightness, shortness of breath and wheezing.   Cardiovascular: Negative for chest pain, palpitations and leg swelling.  Gastrointestinal: Negative for abdominal pain.  All other systems reviewed and are negative.      Objective:   Physical Exam Physical Exam  Nursing note and vitals reviewed.  Constitutional: She is oriented to person, place, and time. She appears well-developed and well-nourished.  HENT:  Head: Normocephalic and atraumatic.  Right Ear: Tympanic membrane and ear canal normal. No drainage. Tympanic membrane is not injected and not erythematous.  Left Ear: Tympanic membrane and ear canal normal. No drainage. Tympanic membrane is not injected and not erythematous.  Nose: Nose normal. Right sinus exhibits no maxillary sinus tenderness and no frontal sinus tenderness. Left sinus exhibits no maxillary sinus tenderness and no frontal sinus tenderness.  Mouth/Throat: Oropharynx is clear and moist. No oral lesions. No oropharyngeal exudate.  Eyes: Conjunctivae and EOM are normal. Pupils are equal, round, and reactive to light.  Neck: Normal range of motion. Neck supple. No JVD present. Carotid bruit is not present. No mass and no thyromegaly present.  Cardiovascular: Normal rate, regular rhythm, S1 normal, S2 normal and intact distal pulses. Exam reveals no gallop and no friction rub.  No murmur heard.  Pulses:  Carotid pulses are 2+ on the right side, and 2+ on the left side.  Dorsalis  pedis pulses are 2+ on the right side, and 2+ on the left side.  No carotid bruit. No LE edema  Pulmonary/Chest: Breath sounds normal. She has no wheezes. She has no rales. She exhibits no tenderness.   Breast no discrete masses no nipple discharge no axillary adenopathy bilaterally. Abdominal: Soft. Bowel sounds are  normal. She exhibits no distension and no mass. There is no hepatosplenomegaly. There is no tenderness. There is no CVA tenderness.  Rectal no mass hemocult neg Musculoskeletal: Normal range of motion.  No active synovitis to joints.  Lymphadenopathy:  She has no cervical adenopathy.  She has no axillary adenopathy.  Right: No inguinal and no supraclavicular adenopathy present.  Left: No inguinal and no supraclavicular adenopathy present.  Neurological: She is alert and oriented to person, place, and time. She has normal strength and normal reflexes. She displays no tremor. No cranial nerve deficit or sensory deficit. Coordination and gait normal.  Skin: Skin is warm and dry. No rash noted. No cyanosis. Nails show no clubbing.  Psychiatric: She has a normal mood and affect. Her speech is normal and behavior is normal. Cognition and memory are normal.           Assessment & Plan:  HM:  Advised 3D mm in August.  Will refer to new GI MD per her request for colonoscopy.  Counseled new lung cancer screening guideline - she is a non-smoker.  Advised to make appt with her GYN for follow up post menopausal bleeding and pap smear.  She voices understanding.  Discussed Zostavax.   Will get AB titer as she is not sure she had chickenpox as a child  Subjective blood in toilet:  Not at all sure why pt did not go for GI eval as advised by her GYN.  She is hemocult neg in office.  She wishes new GI MD.  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

## 2014-03-13 DIAGNOSIS — K921 Melena: Secondary | ICD-10-CM | POA: Insufficient documentation

## 2014-03-13 DIAGNOSIS — Z139 Encounter for screening, unspecified: Secondary | ICD-10-CM | POA: Insufficient documentation

## 2014-03-13 DIAGNOSIS — N950 Postmenopausal bleeding: Secondary | ICD-10-CM | POA: Insufficient documentation

## 2014-03-13 DIAGNOSIS — Z23 Encounter for immunization: Secondary | ICD-10-CM | POA: Insufficient documentation

## 2014-03-13 LAB — LIPID PANEL
Cholesterol: 190 mg/dL (ref 0–200)
HDL: 66 mg/dL (ref >39)
LDL CALC: 110 mg/dL — AB (ref 0–99)
Total CHOL/HDL Ratio: 2.9 Ratio
Triglycerides: 69 mg/dL (ref <150)
VLDL: 14 mg/dL (ref 0–40)

## 2014-03-13 LAB — VARICELLA ZOSTER ANTIBODY, IGG: Varicella IgG: 368.7 Index — ABNORMAL HIGH (ref <135.00)

## 2014-03-13 LAB — HEPATITIS B SURFACE ANTIBODY,QUALITATIVE: HEP B S AB: NEGATIVE

## 2014-03-14 ENCOUNTER — Telehealth: Payer: Self-pay | Admitting: *Deleted

## 2014-03-14 LAB — VARICELLA ZOSTER ANTIBODY, IGM: VARICELLA ZOSTER AB IGM: 0 {ISR} (ref <0.91)

## 2014-03-14 NOTE — Telephone Encounter (Signed)
Message copied by Sherrye Payor on Wed Mar 14, 2014  8:19 AM ------      Message from: Emi Belfast D      Created: Wed Mar 14, 2014  7:17 AM       [name]            Call this patent and let her know that she has been exposed to the chicken pox and if she wants a shingles vaccine she will have to call office so we can order this - advise her to check with [place] to see if they will pay for it. Patient is to check with insurance herself            She may also have the hepatitis b vaccine series.  She can arrange a time with you to come in for injections.  Be sure to document as a nurse visit ------

## 2014-03-14 NOTE — Telephone Encounter (Signed)
Message copied by Sherrye Payor on Wed Mar 14, 2014  8:16 AM ------      Message from: Emi Belfast D      Created: Wed Mar 14, 2014  7:17 AM       Maudie Mercury            Call this patent and let her know that she has been exposed to the chicken pox and if she wants a shingles vaccine she will have to call office so we can order this - advise her to check with [place] to see if they will pay for it. Patient is to check with insurance herself            She may also have the hepatitis b vaccine series.  She can arrange a time with you to come in for injections.  Be sure to document as a nurse visit ------

## 2014-03-14 NOTE — Telephone Encounter (Signed)
Lefy message on cell phone voice mail w/ all  Dr's information & instruction regarding vaccine.

## 2014-03-15 ENCOUNTER — Telehealth: Payer: Self-pay | Admitting: *Deleted

## 2014-03-15 NOTE — Telephone Encounter (Signed)
Called & l/m another message w/detailed  instructions for shots.

## 2014-04-23 ENCOUNTER — Encounter: Payer: Self-pay | Admitting: Internal Medicine

## 2014-05-13 ENCOUNTER — Encounter: Payer: Self-pay | Admitting: Internal Medicine

## 2014-05-13 DIAGNOSIS — K5190 Ulcerative colitis, unspecified, without complications: Secondary | ICD-10-CM | POA: Insufficient documentation

## 2014-06-25 ENCOUNTER — Encounter: Payer: Self-pay | Admitting: Internal Medicine

## 2016-02-18 ENCOUNTER — Encounter: Payer: Self-pay | Admitting: *Deleted

## 2016-02-18 ENCOUNTER — Emergency Department
Admission: EM | Admit: 2016-02-18 | Discharge: 2016-02-18 | Disposition: A | Discharge status: Home / Self Care | Payer: BLUE CROSS/BLUE SHIELD | Source: Home / Self Care | Attending: Family Medicine | Admitting: Family Medicine

## 2016-02-18 DIAGNOSIS — L508 Other urticaria: Secondary | ICD-10-CM

## 2016-02-18 MED ORDER — HYDROXYZINE HCL 25 MG PO TABS: 25.0000 mg | ORAL_TABLET | Freq: Four times a day (QID) | ORAL | Status: DC

## 2016-02-18 MED ORDER — METHYLPREDNISOLONE SODIUM SUCC 40 MG IJ SOLR
40.0000 mg | Freq: Once | INTRAMUSCULAR | Status: AC
  Administered 2016-02-18: 40 mg via INTRAVENOUS

## 2016-02-18 MED ORDER — PREDNISONE 20 MG PO TABS: ORAL_TABLET | ORAL | Status: DC

## 2016-02-18 NOTE — Discharge Instructions (Signed)
Atarax (hydroxizine) is an antihistamine that can be taken to help with itching. This medication can cause drowsiness so do not drive or drink alcohol while taking.    You were given a shot of solumedrol (a steroid) today to help with itching and swelling from a likely allergic reaction.  You have been prescribed 5 days of prednisone, an oral steroid.  You may start this medication tomorrow with breakfast.     Hives Hives are itchy, red, swollen areas of the skin. They can vary in size and location on your body. Hives can come and go for hours or several days (acute hives) or for several weeks (chronic hives). Hives do not spread from person to person (noncontagious). They may get worse with scratching, exercise, and emotional stress. CAUSES   Allergic reaction to food, additives, or drugs.  Infections, including the common cold.  Illness, such as vasculitis, lupus, or thyroid disease.  Exposure to sunlight, heat, or cold.  Exercise.  Stress.  Contact with chemicals. SYMPTOMS   Red or white swollen patches on the skin. The patches may change size, shape, and location quickly and repeatedly.  Itching.  Swelling of the hands, feet, and face. This may occur if hives develop deeper in the skin. DIAGNOSIS  Your caregiver can usually tell what is wrong by performing a physical exam. Skin or blood tests may also be done to determine the cause of your hives. In some cases, the cause cannot be determined. TREATMENT  Mild cases usually get better with medicines such as antihistamines. Severe cases may require an emergency epinephrine injection. If the cause of your hives is known, treatment includes avoiding that trigger.  HOME CARE INSTRUCTIONS   Avoid causes that trigger your hives.  Take antihistamines as directed by your caregiver to reduce the severity of your hives. Non-sedating or low-sedating antihistamines are usually recommended. Do not drive while taking an  antihistamine.  Take any other medicines prescribed for itching as directed by your caregiver.  Wear loose-fitting clothing.  Keep all follow-up appointments as directed by your caregiver. SEEK MEDICAL CARE IF:   You have persistent or severe itching that is not relieved with medicine.  You have painful or swollen joints. SEEK IMMEDIATE MEDICAL CARE IF:   You have a fever.  Your tongue or lips are swollen.  You have trouble breathing or swallowing.  You feel tightness in the throat or chest.  You have abdominal pain. These problems may be the first sign of a life-threatening allergic reaction. Call your local emergency services (911 in [place] MAKE SURE YOU:   Understand these instructions.  Will watch your condition.  Will get help right away if you are not doing well or get worse.   This information is not intended to replace advice given to you by your health care provider. Make sure you discuss any questions you have with your health care provider.   Document Released: 08/10/2005 Document Revised: 08/15/2013 Document Reviewed: 11/03/2011 Elsevier Interactive Patient Education [employer].

## 2016-02-18 NOTE — ED Notes (Signed)
Pt c/o hives all over x 1 1/2 wk. Reports hx of chronic hives.  She takes zyrtec BID.

## 2016-02-18 NOTE — ED Provider Notes (Signed)
CSN: [id number]     Arrival date & time 02/18/16  1609 History   First MD Initiated Contact with Patient 02/18/16 1612     Chief Complaint  Patient presents with  . Urticaria   (Consider location/radiation/quality/duration/timing/severity/associated sxs/prior Treatment) HPI  [name] is a 55 y.o. female presenting to UC with c/o intermittent recurrent hives all over her body for the last 1-2 weeks. She reports hx of chronic hives but states she had them under control with a gluten free diet.  She went to [place] recently and has not been following her diet as strictly. She also notes using a lotion with coconut oil in it so she is unsure what triggered her hives but they are moderately pruritic.  Denies oral swelling or SOB.  She has been taking her Zyrtec BID w/o relief.    Past Medical History  Diagnosis Date  . Migraine without aura   . Menopausal syndrome   . Obese   . Arthritis   . Depression   . PONV (postoperative nausea and vomiting)   . Heart murmur     echo 10/20/2011  . GERD (gastroesophageal reflux disease)   . Plantar warts   . Postop Acute blood loss anemia 11/05/2011   Past Surgical History  Procedure Laterality Date  . Tonsillectomy  1979  . Knee arthroscopy  2005  . Brain surgery  15 yrs.ago    calcinatied bone from forehead  . Total knee arthroplasty  11/02/2011    Procedure: TOTAL KNEE ARTHROPLASTY;  Surgeon: [name], MD;  Location: [facility];  Service: Orthopedics;  Laterality: Left;   Family History  Problem Relation Age of Onset  . Heart disease Mother     mitral valve prolapse  . Lung cancer Father   . Thyroid disease Father   . Thyroid disease Sister   . Heart disease Maternal Aunt   . Heart disease Maternal Uncle   . Thyroid disease Paternal Aunt   . Heart disease Maternal Grandfather   . Heart disease Paternal Grandfather   . Heart disease Maternal Uncle   . Heart disease Maternal Uncle   . Heart disease Maternal Uncle   . Diabetes  Maternal Aunt    Social History  Substance Use Topics  . Smoking status: Never Smoker   . Smokeless tobacco: None  . Alcohol Use: Yes     Comment: occassionally   OB History    Gravida Para Term Preterm AB TAB SAB Ectopic Multiple Living   1 1             Review of Systems  Constitutional: Negative for fever and chills.  HENT: Negative for facial swelling.   Respiratory: Negative for chest tightness, shortness of breath, wheezing and stridor.   Cardiovascular: Negative for chest pain and palpitations.  Gastrointestinal: Negative for nausea and vomiting.  Skin: Positive for rash. Negative for wound.    Allergies  Codeine and Onion  Home Medications   Prior to Admission medications   Medication Sig Start Date End Date Taking? Authorizing Provider  acetaminophen (TYLENOL) 325 MG tablet Take 650 mg by mouth every 6 (six) hours as needed.   Yes Historical Provider, MD  cetirizine (ZYRTEC) 10 MG tablet Take 10 mg by mouth daily.   Yes Historical Provider, MD  eletriptan (RELPAX) 20 MG tablet Take 20 mg by mouth as needed for migraine or headache. One tablet by mouth at onset of headache. May repeat in 2 hours if headache persists or  recurs.   Yes Historical Provider, MD  HYDROcodone-acetaminophen (NORCO/VICODIN) 5-325 MG per tablet Take 1 tablet by mouth every 6 (six) hours as needed for pain.   Yes Historical Provider, MD  meloxicam (MOBIC) 15 MG tablet Take 7.5 mg by mouth daily.   Yes Historical Provider, MD  naproxen sodium (ANAPROX) 220 MG tablet Take 220 mg by mouth 2 (two) times daily with a meal.   Yes Historical Provider, MD  omeprazole (PRILOSEC OTC) 20 MG tablet Take 20 mg by mouth daily.   Yes Historical Provider, MD  OnabotulinumtoxinA (BOTOX IJ) Inject as directed.   Yes Historical Provider, MD  topiramate (TOPAMAX) 100 MG tablet Take 400 mg by mouth at bedtime.    Yes Historical Provider, MD  venlafaxine (EFFEXOR-XR) 75 MG 24 hr capsule Take 225 mg by mouth daily after  breakfast.    Yes Historical Provider, MD  hydrOXYzine (ATARAX/VISTARIL) 25 MG tablet Take 1 tablet (25 mg total) by mouth every 6 (six) hours. 02/18/16   Noland Fordyce, PA-C  Melatonin 3 MG CAPS Take 3 mg by mouth.    Historical Provider, MD  MULTIPLE VITAMIN PO Take 1 tablet by mouth.    Historical Provider, MD  predniSONE (DELTASONE) 20 MG tablet 3 tabs po day one, then 2 po daily x 4 days 02/18/16   Noland Fordyce, PA-C   Meds Ordered and Administered this Visit   Medications  methylPREDNISolone sodium succinate (SOLU-MEDROL) 40 mg/mL injection 40 mg (40 mg Intravenous Given 02/18/16 1640)    BP 135/86 mmHg  Pulse 92  Temp(Src) 98 F (36.7 C) (Oral)  Resp 16  Ht 5' 6.5" (1.689 m)  Wt 195 lb (88.451 kg)  BMI 31.01 kg/m2  SpO2 99%  LMP 11/02/2011 No data found.   Physical Exam  Constitutional: She is oriented to person, place, and time. She appears well-developed and well-nourished.  HENT:  Head: Normocephalic and atraumatic.  Mouth/Throat: Oropharynx is clear and moist.  No oral swelling  Eyes: EOM are normal.  Neck: Normal range of motion. Neck supple.  Cardiovascular: Normal rate, regular rhythm and normal heart sounds.   Pulmonary/Chest: Effort normal and breath sounds normal. No stridor. No respiratory distress. She has no wheezes.  Musculoskeletal: Normal range of motion.  Neurological: She is alert and oriented to person, place, and time.  Skin: Skin is warm and dry. There is erythema.  Faint diffuse erythematous wheals c/w urticaria on arms and legs. No evidence of underlying infection.  Psychiatric: She has a normal mood and affect. Her behavior is normal.  Nursing note and vitals reviewed.   ED Course  Procedures (including critical care time)  Labs Review Labs Reviewed - No data to display  Imaging Review No results found.   MDM   1. Recurrent urticaria    Pt requesting steroids to help with itching as she has had in the past with relief.  Faint  diffuse urticaria w/o evidence of underlying infection. No evidence of anaphylaxis.   Tx in UC: Solumedrol 40mg  IM  Rx: Prednisone and atarax. F/u with PCP in 1 week if not improving, sooner if worsening. Patient verbalized understanding and agreement with treatment plan.     Noland Fordyce, PA-C 02/18/16 1827

## 2016-05-29 DIAGNOSIS — Z471 Aftercare following joint replacement surgery: Secondary | ICD-10-CM | POA: Diagnosis not present

## 2016-05-29 DIAGNOSIS — Z96652 Presence of left artificial knee joint: Secondary | ICD-10-CM | POA: Diagnosis not present

## 2016-05-29 DIAGNOSIS — M1711 Unilateral primary osteoarthritis, right knee: Secondary | ICD-10-CM | POA: Diagnosis not present

## 2016-06-03 MED FILL — MELOXICAM 15 MG TABLET: 15 | 30 days supply | Qty: 30 | Fill #0

## 2016-06-18 DIAGNOSIS — G43019 Migraine without aura, intractable, without status migrainosus: Secondary | ICD-10-CM | POA: Diagnosis not present

## 2016-06-18 DIAGNOSIS — G43719 Chronic migraine without aura, intractable, without status migrainosus: Secondary | ICD-10-CM | POA: Diagnosis not present

## 2016-06-18 MED FILL — BOTOX 200 UNITS VIAL: 200 | 30 days supply | Qty: 1 | Fill #0

## 2016-07-07 MED FILL — VENLAFAXINE HCL ER 75 MG CA: 75 | 90 days supply | Qty: 180 | Fill #0

## 2016-07-07 MED FILL — TOPIRAMATE 200 MG TABLET: 200 | 90 days supply | Qty: 180 | Fill #0

## 2016-07-07 MED FILL — MELOXICAM 15 MG TABLET: 15 | 30 days supply | Qty: 30 | Fill #1

## 2016-10-07 MED FILL — TOPIRAMATE 200 MG TABLET: 200 | 90 days supply | Qty: 180 | Fill #0

## 2016-10-07 MED FILL — VENLAFAXINE HCL ER 75 MG CA: 75 | 90 days supply | Qty: 180 | Fill #0

## 2016-10-21 DIAGNOSIS — G43019 Migraine without aura, intractable, without status migrainosus: Secondary | ICD-10-CM | POA: Diagnosis not present

## 2016-10-21 DIAGNOSIS — G43719 Chronic migraine without aura, intractable, without status migrainosus: Secondary | ICD-10-CM | POA: Diagnosis not present

## 2016-10-26 DIAGNOSIS — M7541 Impingement syndrome of right shoulder: Secondary | ICD-10-CM | POA: Diagnosis not present

## 2016-10-27 MED FILL — diazePAM 5 MG TABS: 5 | 30 days supply | Qty: 30 | Fill #0

## 2016-11-06 DIAGNOSIS — M25511 Pain in right shoulder: Secondary | ICD-10-CM | POA: Diagnosis not present

## 2016-11-16 DIAGNOSIS — M12811 Other specific arthropathies, not elsewhere classified, right shoulder: Secondary | ICD-10-CM | POA: Diagnosis not present

## 2016-11-17 MED FILL — traMADol HCL 50 MG TABS: 50 | 30 days supply | Qty: 30 | Fill #0

## 2016-12-30 MED FILL — traMADol HCL 50 MG TABS: 50 | 30 days supply | Qty: 30 | Fill #0

## 2017-01-11 MED FILL — TOPIRAMATE 200 MG TABLET: 200 | 90 days supply | Qty: 180 | Fill #0

## 2017-01-15 MED FILL — VENLAFAXINE HCL ER 75 MG CA: 75 | 30 days supply | Qty: 60 | Fill #0

## 2017-01-20 DIAGNOSIS — G43719 Chronic migraine without aura, intractable, without status migrainosus: Secondary | ICD-10-CM | POA: Diagnosis not present

## 2017-02-09 DIAGNOSIS — M1711 Unilateral primary osteoarthritis, right knee: Secondary | ICD-10-CM | POA: Diagnosis not present

## 2017-02-09 DIAGNOSIS — M7632 Iliotibial band syndrome, left leg: Secondary | ICD-10-CM | POA: Diagnosis not present

## 2017-02-15 MED FILL — VENLAFAXINE HCL ER 75 MG CA: 75 | 90 days supply | Qty: 180 | Fill #0

## 2017-02-17 DIAGNOSIS — M1711 Unilateral primary osteoarthritis, right knee: Secondary | ICD-10-CM | POA: Diagnosis not present

## 2017-02-17 MED FILL — diazePAM 5 MG TABS: 5 | 30 days supply | Qty: 30 | Fill #0

## 2017-02-25 DIAGNOSIS — M1711 Unilateral primary osteoarthritis, right knee: Secondary | ICD-10-CM | POA: Diagnosis not present

## 2017-03-24 MED FILL — ELETRIPTAN HBR 40 MG TABLET: 40 | 30 days supply | Qty: 12 | Fill #0

## 2017-04-05 MED FILL — TOPIRAMATE 200 MG TABLET: 200 | 90 days supply | Qty: 180 | Fill #0

## 2017-04-13 MED FILL — BOTOX 100 UNITS VIAL: 100 | 30 days supply | Qty: 2 | Fill #0

## 2017-04-21 DIAGNOSIS — G518 Other disorders of facial nerve: Secondary | ICD-10-CM | POA: Diagnosis not present

## 2017-04-21 DIAGNOSIS — G43019 Migraine without aura, intractable, without status migrainosus: Secondary | ICD-10-CM | POA: Diagnosis not present

## 2017-04-21 DIAGNOSIS — M791 Myalgia: Secondary | ICD-10-CM | POA: Diagnosis not present

## 2017-04-21 DIAGNOSIS — G8929 Other chronic pain: Secondary | ICD-10-CM | POA: Diagnosis not present

## 2017-04-21 DIAGNOSIS — M12811 Other specific arthropathies, not elsewhere classified, right shoulder: Secondary | ICD-10-CM | POA: Diagnosis not present

## 2017-04-21 DIAGNOSIS — G43719 Chronic migraine without aura, intractable, without status migrainosus: Secondary | ICD-10-CM | POA: Diagnosis not present

## 2017-04-21 DIAGNOSIS — M75101 Unspecified rotator cuff tear or rupture of right shoulder, not specified as traumatic: Secondary | ICD-10-CM | POA: Diagnosis not present

## 2017-04-21 DIAGNOSIS — M542 Cervicalgia: Secondary | ICD-10-CM | POA: Diagnosis not present

## 2017-04-21 DIAGNOSIS — M25511 Pain in right shoulder: Secondary | ICD-10-CM | POA: Diagnosis not present

## 2017-05-18 MED FILL — VENLAFAXINE HCL ER 75 MG CA: 75 | 90 days supply | Qty: 180 | Fill #0

## 2017-07-01 MED FILL — TOPIRAMATE 200 MG TABLET: 200 | 90 days supply | Qty: 180 | Fill #0

## 2017-07-21 MED FILL — BOTOX 100 UNITS VIAL: 100 | 30 days supply | Qty: 2 | Fill #1

## 2017-07-22 DIAGNOSIS — G518 Other disorders of facial nerve: Secondary | ICD-10-CM | POA: Diagnosis not present

## 2017-07-22 DIAGNOSIS — M542 Cervicalgia: Secondary | ICD-10-CM | POA: Diagnosis not present

## 2017-07-22 DIAGNOSIS — G43719 Chronic migraine without aura, intractable, without status migrainosus: Secondary | ICD-10-CM | POA: Diagnosis not present

## 2017-07-22 DIAGNOSIS — M791 Myalgia, unspecified site: Secondary | ICD-10-CM | POA: Diagnosis not present

## 2017-07-22 DIAGNOSIS — G43019 Migraine without aura, intractable, without status migrainosus: Secondary | ICD-10-CM | POA: Diagnosis not present

## 2017-08-12 MED FILL — VENLAFAXINE HCL ER 75 MG CA: 75 | 90 days supply | Qty: 180 | Fill #0

## 2017-08-12 MED FILL — ELETRIPTAN HBR 40 MG TABLET: 40 | 22 days supply | Qty: 6 | Fill #0

## 2017-08-27 ENCOUNTER — Telehealth: Payer: Self-pay | Admitting: General Practice

## 2017-08-27 NOTE — Telephone Encounter (Signed)
Called pt left voicemail advising them to call us back to schedule new pt appt visit.

## 2017-09-02 DIAGNOSIS — Z96652 Presence of left artificial knee joint: Secondary | ICD-10-CM | POA: Diagnosis not present

## 2017-09-02 DIAGNOSIS — M1711 Unilateral primary osteoarthritis, right knee: Secondary | ICD-10-CM | POA: Diagnosis not present

## 2017-09-05 NOTE — Progress Notes (Addendum)
[place] at [facility] [address] [place], [place] [address] [phone]  Date:  09/08/2017   Name:  [name]   DOB:  1960/10/28   MRN:  [id number]  PCP:  Danielle Mclean, MD    Chief Complaint: No chief complaint on file.   History of Present Illness:  [name] is a 57 y.o. very pleasant female patient who presents with the following:  Here today as a new patient to establish care Most recent primary care note in chart from 2015 History of ulcerative colitis, GERD, migraine, depression One of her friends sees Dr. Etter Benson so she decided to come and see [place] She lives in [place] She has been a Regulatory affairs officer for many years  She gets periodic botox injections for her migraine headaches- [facility],she has gone there for years  She does use topamax for headaches, but does not know how much this is helping her at this time.  She is thinking of stopping it as it seems to cause memory isues  She is having a knee replacement coming up per [name] She had her left knee replaced in 2015, the plan to do the right knee in a couple of months   She did have an echo in 2013 due to a murmur, and it was negative - Left ventricle: The cavity size was normal. Systolic function was normal. The estimated ejection fraction was in the range of 55% to 60%. Wall motion was normal; there were no regional wall motion abnormalities. Doppler parameters are consistent with abnormal left ventricular relaxation (grade 1 diastolic dysfunction). - Left atrium: The atrium was mildly dilated. - Atrial septum: No defect or patent foramen ovale was identified. She does not ever have any CP.  She may get out of breath with exertion but thinks this is due to being out of shape.  She is able to do household chores such as vacuuming without any distress (except for knee  pain) She never had a stress test   She notes a history of arthritis that runs in her family  She is working as a Environmental consultant on the hospital floor  Pt notes that she is not really sure about her historical dx of UC- she does not have a GI at this time She did have a colonoscopy "a long time ago and there was nothing there."  I will refer her to see GI  She notes a family history of CAD She did have a breakfast bar this morning   Last pap 3-4 years ago  Most recent mammo 3-4 yeaars ago  She has one daughter, who is married and has 3 daughters of her own- 64, 5 and 3  She takes care of her grands a fair amount   She does notice some urge incontinence that has been more pronounced over the lasts few months- no pain or dysuria, just notes that "when I have to go I have to go!"  She would like to try some medication for this   Patient Active Problem List   Diagnosis Date Noted  . Ulcerative colitis, unspecified  see GI note Dr. Valli Benson 02/2007 05/13/2014  . Post-menopausal bleeding  evalutated  Dr. Lynnette Benson 03/13/2014  . Blood in stool 03/13/2014  . Fibroadenoma of left breast 04/10/2013  . ANA positive 03/20/2013  . Elevated transaminase measurement 03/15/2013  .  Vaginal spotting 12/08/2012  . Abnormal Pap smear 10/13/2011  . Cardiac murmur 10/13/2011  . GERD (gastroesophageal reflux disease) 10/13/2011  . Urticaria 10/13/2011  . Arthritis   . Migraine   . Depression   . Menopausal syndrome     Past Medical History:  Diagnosis Date  . Arthritis   . Depression   . GERD (gastroesophageal reflux disease)   . Heart murmur    echo 10/20/2011  . Menopausal syndrome   . Migraine without aura   . Obese   . Plantar warts   . PONV (postoperative nausea and vomiting)   . Postop Acute blood loss anemia 11/05/2011    Past Surgical History:  Procedure Laterality Date  . BRAIN SURGERY  15 yrs.ago   calcinatied bone from forehead  . KNEE ARTHROSCOPY  2005  .  TONSILLECTOMY  1979  . TOTAL KNEE ARTHROPLASTY  11/02/2011   Procedure: TOTAL KNEE ARTHROPLASTY;  Surgeon: Danielle Alf, MD;  Location: [facility];  Service: Orthopedics;  Laterality: Left;    Social History   Tobacco Use  . Smoking status: Never Smoker  Substance Use Topics  . Alcohol use: Yes    Comment: occassionally  . Drug use: No    Family History  Problem Relation Age of Onset  . Heart disease Mother        mitral valve prolapse  . Lung cancer Father   . Thyroid disease Father   . Thyroid disease Sister   . Heart disease Maternal Aunt   . Heart disease Maternal Uncle   . Thyroid disease Paternal Aunt   . Heart disease Maternal Grandfather   . Heart disease Paternal Grandfather   . Heart disease Maternal Uncle   . Heart disease Maternal Uncle   . Heart disease Maternal Uncle   . Diabetes Maternal Aunt     Allergies  Allergen Reactions  . Codeine Nausea And Vomiting  . Onion Other (See Comments)    Headache     Medication list has been reviewed and updated.  Current Outpatient Medications on File Prior to Visit  Medication Sig Dispense Refill  . acetaminophen (TYLENOL) 325 MG tablet Take 650 mg by mouth every 6 (six) hours as needed.    . cetirizine (ZYRTEC) 10 MG tablet Take 10 mg by mouth daily.    Marland Kitchen eletriptan (RELPAX) 20 MG tablet Take 20 mg by mouth as needed for migraine or headache. One tablet by mouth at onset of headache. May repeat in 2 hours if headache persists or recurs.    Marland Kitchen HYDROcodone-acetaminophen (NORCO/VICODIN) 5-325 MG per tablet Take 1 tablet by mouth every 6 (six) hours as needed for pain.    . hydrOXYzine (ATARAX/VISTARIL) 25 MG tablet Take 1 tablet (25 mg total) by mouth every 6 (six) hours. 12 tablet 0  . Melatonin 3 MG CAPS Take 3 mg by mouth.    . meloxicam (MOBIC) 15 MG tablet Take 7.5 mg by mouth daily.    . MULTIPLE VITAMIN PO Take 1 tablet by mouth.    . naproxen sodium (ANAPROX) 220 MG tablet Take 220 mg by mouth 2 (two) times  daily with a meal.    . omeprazole (PRILOSEC OTC) 20 MG tablet Take 20 mg by mouth daily.    . OnabotulinumtoxinA (BOTOX IJ) Inject as directed.    . predniSONE (DELTASONE) 20 MG tablet 3 tabs po day one, then 2 po daily x 4 days 11 tablet 0  . topiramate (TOPAMAX) 100 MG tablet Take  400 mg by mouth at bedtime.     Marland Kitchen venlafaxine (EFFEXOR-XR) 75 MG 24 hr capsule Take 225 mg by mouth daily after breakfast.      No current facility-administered medications on file prior to visit.     Review of Systems:  As per HPI- otherwise negative. No fever or chills No CP No rash    Physical Examination: Vitals:   09/08/17 1102  BP: 129/81  Pulse: 81  Resp: 16  Temp: 98 F (36.7 C)  SpO2: 99%   Vitals:   09/08/17 1102  Weight: 184 lb (83.5 kg)  Height: 5\' 6"  (1.676 m)   Body mass index is 29.7 kg/m. Ideal Body Weight: Weight in (lb) to have BMI = 25: 154.6  GEN: WDWN, NAD, Non-toxic, A & O x 3, mild overweight, looks well  HEENT: Atraumatic, Normocephalic. Neck supple. No masses, No LAD.  Bilateral TM wnl, oropharynx normal.  PEERL,EOMI.   Ears and Nose: No external deformity. CV: RRR, No M/G/R. No JVD. No thrill. No extra heart sounds. PULM: CTA B, no wheezes, crackles, rhonchi. No retractions. No resp. distress. No accessory muscle use. ABD: S, NT, ND, +BS. No rebound. No HSM. EXTR: No c/c/e NEURO Normal gait.  PSYCH: Normally interactive. Conversant. Not depressed or anxious appearing.  Calm demeanor.  Pelvic: normal, no vaginal lesions or discharge. Uterus normal, no CMT, no adnexal tendereness or masses   EKG: compared to tracing from 09/2011- NSR, no change, no concerning findings  Assessment and Plan: Physical exam  Pre-operative cardiovascular examination - Plan: CBC, EKG 12-Lead  Screening for diabetes mellitus - Plan: Comprehensive metabolic panel, Hemoglobin A1c  Screening for hyperlipidemia - Plan: Lipid panel  Encounter for hepatitis C screening test for low  risk patient - Plan: Hepatitis C antibody  Screening for cervical cancer - Plan: Cytology - PAP  Here today to establish care- we ended up doing a CPE for her as well Will plan further follow- up pending labs. Pap pending Advised to get a mammogram and she plans to do so She would like to try a medication for urge incontin- rx for detrol No history of glaucoma  Signed Lamar Blinks, MD  Received her labs, message to  Pt Your blood count is normal Metabolic profile is normal Cholesterol is overall quite good-  You have a good HDL number so your total to HDL ratio is favorable. Hepatitis C screening is negative as expected Pap is normal You A1c (average blood sugar) is just at the cut off for pre-diabetes.  We will keep an eye on this, and once your knee is replaced your should be able to exercise more easily!    Take care Let's visit in about 6 months.   Results for orders placed or performed in visit on 09/08/17  CBC  Result Value Ref Range   WBC 6.0 4.0 - 10.5 K/uL   RBC 4.37 3.87 - 5.11 Mil/uL   Platelets 319.0 150.0 - 400.0 K/uL   Hemoglobin 13.6 12.0 - 15.0 g/dL   HCT 41.9 36.0 - 46.0 %   MCV 95.8 78.0 - 100.0 fl   MCHC 32.5 30.0 - 36.0 g/dL   RDW 13.8 11.5 - 15.5 %  Comprehensive metabolic panel  Result Value Ref Range   Sodium 140 135 - 145 mEq/L   Potassium 4.1 3.5 - 5.1 mEq/L   Chloride 105 96 - 112 mEq/L   CO2 26 19 - 32 mEq/L   Glucose, Bld 98 70 - 99  mg/dL   BUN 16 6 - 23 mg/dL   Creatinine, Ser 0.96 0.40 - 1.20 mg/dL   Total Bilirubin 0.4 0.2 - 1.2 mg/dL   Alkaline Phosphatase 60 39 - 117 U/L   AST 18 0 - 37 U/L   ALT 13 0 - 35 U/L   Total Protein 7.2 6.0 - 8.3 g/dL   Albumin 4.3 3.5 - 5.2 g/dL   Calcium 9.8 8.4 - 10.5 mg/dL   GFR 63.38 >60.00 mL/min  Lipid panel  Result Value Ref Range   Cholesterol 201 (H) 0 - 200 mg/dL   Triglycerides 81.0 0.0 - 149.0 mg/dL   HDL 63.30 >39.00 mg/dL   VLDL 16.2 0.0 - 40.0 mg/dL   LDL Cholesterol 122 (H) 0 -  99 mg/dL   Total CHOL/HDL Ratio 3    NonHDL 137.94   Hemoglobin A1c  Result Value Ref Range   Hgb A1c MFr Bld 5.7 4.6 - 6.5 %  Hepatitis C antibody  Result Value Ref Range   Hepatitis C Ab NON-REACTIVE NON-REACTI   SIGNAL TO CUT-OFF 0.01 <1.00  Cytology - PAP  Result Value Ref Range   Adequacy      Satisfactory for evaluation  endocervical/transformation zone component PRESENT.   Diagnosis      NEGATIVE FOR INTRAEPITHELIAL LESIONS OR MALIGNANCY.   HPV NOT DETECTED    Material Submitted CervicoVaginal Pap [ThinPrep Imaged]    CYTOLOGY - PAP PAP RESULT

## 2017-09-08 ENCOUNTER — Ambulatory Visit: Payer: 59 | Admitting: Family Medicine

## 2017-09-08 ENCOUNTER — Encounter (HOSPITAL_BASED_OUTPATIENT_CLINIC_OR_DEPARTMENT_OTHER): Payer: Self-pay

## 2017-09-08 ENCOUNTER — Other Ambulatory Visit (HOSPITAL_COMMUNITY)
Admission: RE | Admit: 2017-09-08 | Discharge: 2017-09-08 | Disposition: A | Discharge status: Home / Self Care | Payer: 59 | Source: Ambulatory Visit | Attending: Family Medicine | Admitting: Family Medicine

## 2017-09-08 ENCOUNTER — Encounter: Payer: Self-pay | Admitting: Family Medicine

## 2017-09-08 ENCOUNTER — Other Ambulatory Visit: Payer: Self-pay | Admitting: Family Medicine

## 2017-09-08 ENCOUNTER — Ambulatory Visit (HOSPITAL_BASED_OUTPATIENT_CLINIC_OR_DEPARTMENT_OTHER)
Admission: RE | Admit: 2017-09-08 | Discharge: 2017-09-08 | Disposition: A | Discharge status: Home / Self Care | Payer: 59 | Source: Ambulatory Visit | Attending: Family Medicine | Admitting: Family Medicine

## 2017-09-08 VITALS — BP 129/81 | HR 81 | Temp 98.0°F | Resp 16 | Ht 66.0 in | Wt 184.0 lb

## 2017-09-08 DIAGNOSIS — Z0000 Encounter for general adult medical examination without abnormal findings: Secondary | ICD-10-CM | POA: Diagnosis not present

## 2017-09-08 DIAGNOSIS — N6489 Other specified disorders of breast: Secondary | ICD-10-CM | POA: Insufficient documentation

## 2017-09-08 DIAGNOSIS — Z124 Encounter for screening for malignant neoplasm of cervix: Secondary | ICD-10-CM | POA: Insufficient documentation

## 2017-09-08 DIAGNOSIS — Z1231 Encounter for screening mammogram for malignant neoplasm of breast: Secondary | ICD-10-CM

## 2017-09-08 DIAGNOSIS — Z1151 Encounter for screening for human papillomavirus (HPV): Secondary | ICD-10-CM | POA: Diagnosis not present

## 2017-09-08 DIAGNOSIS — Z13220 Encounter for screening for lipoid disorders: Secondary | ICD-10-CM | POA: Diagnosis not present

## 2017-09-08 DIAGNOSIS — Z0001 Encounter for general adult medical examination with abnormal findings: Secondary | ICD-10-CM

## 2017-09-08 DIAGNOSIS — R928 Other abnormal and inconclusive findings on diagnostic imaging of breast: Secondary | ICD-10-CM | POA: Insufficient documentation

## 2017-09-08 DIAGNOSIS — Z131 Encounter for screening for diabetes mellitus: Secondary | ICD-10-CM

## 2017-09-08 DIAGNOSIS — Z01810 Encounter for preprocedural cardiovascular examination: Secondary | ICD-10-CM | POA: Diagnosis not present

## 2017-09-08 DIAGNOSIS — N3941 Urge incontinence: Secondary | ICD-10-CM | POA: Diagnosis not present

## 2017-09-08 DIAGNOSIS — Z1159 Encounter for screening for other viral diseases: Secondary | ICD-10-CM | POA: Diagnosis not present

## 2017-09-08 LAB — COMPREHENSIVE METABOLIC PANEL
ALT: 13 U/L (ref 0–35)
AST: 18 U/L (ref 0–37)
Albumin: 4.3 g/dL (ref 3.5–5.2)
Alkaline Phosphatase: 60 U/L (ref 39–117)
BILIRUBIN TOTAL: 0.4 mg/dL (ref 0.2–1.2)
BUN: 16 mg/dL (ref 6–23)
CALCIUM: 9.8 mg/dL (ref 8.4–10.5)
CHLORIDE: 105 meq/L (ref 96–112)
CO2: 26 meq/L (ref 19–32)
CREATININE: 0.96 mg/dL (ref 0.40–1.20)
GFR: 63.38 mL/min (ref >60.00)
GLUCOSE: 98 mg/dL (ref 70–99)
Potassium: 4.1 mEq/L (ref 3.5–5.1)
SODIUM: 140 meq/L (ref 135–145)
Total Protein: 7.2 g/dL (ref 6.0–8.3)

## 2017-09-08 LAB — LIPID PANEL
CHOL/HDL RATIO: 3
Cholesterol: 201 mg/dL — ABNORMAL HIGH (ref 0–200)
HDL: 63.3 mg/dL (ref >39.00)
LDL CALC: 122 mg/dL — AB (ref 0–99)
NONHDL: 137.94
Triglycerides: 81 mg/dL (ref 0.0–149.0)
VLDL: 16.2 mg/dL (ref 0.0–40.0)

## 2017-09-08 LAB — CBC
HCT: 41.9 % (ref 36.0–46.0)
HEMOGLOBIN: 13.6 g/dL (ref 12.0–15.0)
MCHC: 32.5 g/dL (ref 30.0–36.0)
MCV: 95.8 fl (ref 78.0–100.0)
PLATELETS: 319 10*3/uL (ref 150.0–400.0)
RBC: 4.37 Mil/uL (ref 3.87–5.11)
RDW: 13.8 % (ref 11.5–15.5)
WBC: 6 10*3/uL (ref 4.0–10.5)

## 2017-09-08 LAB — HEMOGLOBIN A1C: HEMOGLOBIN A1C: 5.7 % (ref 4.6–6.5)

## 2017-09-08 MED ORDER — TOLTERODINE TARTRATE ER 4 MG PO CP24: 4.0000 mg | ORAL_CAPSULE | Freq: Every day | ORAL | 6 refills | Status: DC

## 2017-09-08 NOTE — Patient Instructions (Addendum)
Please do look into getting a mammogram soon as this is due I will be in touch with your labs and we will check your pap today as well I will refer you to GI to update your colonoscopy and hopefully answer this question about ulcerative colitis for [place]  Try the Detrol LA for your urinary symptoms- also keep doing those Kegel exercises!  Let me know if not helpful in reducing your urinary symptoms   Health Maintenance for Postmenopausal Women Menopause is a normal process in which your reproductive ability comes to an end. This process happens gradually over a span of months to years, usually between the ages of 5 and 70. Menopause is complete when you have missed 12 consecutive menstrual periods. It is important to talk with your health care provider about some of the most common conditions that affect postmenopausal women, such as heart disease, cancer, and bone loss (osteoporosis). Adopting a healthy lifestyle and getting preventive care can help to promote your health and wellness. Those actions can also lower your chances of developing some of these common conditions. What should I know about menopause? During menopause, you may experience a number of symptoms, such as:  Moderate-to-severe hot flashes.  Night sweats.  Decrease in sex drive.  Mood swings.  Headaches.  Tiredness.  Irritability.  Memory problems.  Insomnia.  Choosing to treat or not to treat menopausal changes is an individual decision that you make with your health care provider. What should I know about hormone replacement therapy and supplements? Hormone therapy products are effective for treating symptoms that are associated with menopause, such as hot flashes and night sweats. Hormone replacement carries certain risks, especially as you become older. If you are thinking about using estrogen or estrogen with progestin treatments, discuss the benefits and risks with your health care provider. What should I know  about heart disease and stroke? Heart disease, heart attack, and stroke become more likely as you age. This may be due, in part, to the hormonal changes that your body experiences during menopause. These can affect how your body processes dietary fats, triglycerides, and cholesterol. Heart attack and stroke are both medical emergencies. There are many things that you can do to help prevent heart disease and stroke:  Have your blood pressure checked at least every 1-2 years. High blood pressure causes heart disease and increases the risk of stroke.  If you are 70-24 years old, ask your health care provider if you should take aspirin to prevent a heart attack or a stroke.  Do not use any tobacco products, including cigarettes, chewing tobacco, or electronic cigarettes. If you need help quitting, ask your health care provider.  It is important to eat a healthy diet and maintain a healthy weight. ? Be sure to include plenty of vegetables, fruits, low-fat dairy products, and lean protein. ? Avoid eating foods that are high in solid fats, added sugars, or salt (sodium).  Get regular exercise. This is one of the most important things that you can do for your health. ? Try to exercise for at least 150 minutes each week. The type of exercise that you do should increase your heart rate and make you sweat. This is known as moderate-intensity exercise. ? Try to do strengthening exercises at least twice each week. Do these in addition to the moderate-intensity exercise.  Know your numbers.Ask your health care provider to check your cholesterol and your blood glucose. Continue to have your blood tested as directed by your  health care provider.  What should I know about cancer screening? There are several types of cancer. Take the following steps to reduce your risk and to catch any cancer development as early as possible. Breast Cancer  Practice breast self-awareness. ? This means understanding how your  breasts normally appear and feel. ? It also means doing regular breast self-exams. Let your health care provider know about any changes, no matter how small.  If you are 96 or older, have a clinician do a breast exam (clinical breast exam or CBE) every year. Depending on your age, family history, and medical history, it may be recommended that you also have a yearly breast X-ray (mammogram).  If you have a family history of breast cancer, talk with your health care provider about genetic screening.  If you are at high risk for breast cancer, talk with your health care provider about having an MRI and a mammogram every year.  Breast cancer (BRCA) gene test is recommended for women who have family members with BRCA-related cancers. Results of the assessment will determine the need for genetic counseling and BRCA1 and for BRCA2 testing. BRCA-related cancers include these types: ? Breast. This occurs in males or females. ? Ovarian. ? Tubal. This may also be called fallopian tube cancer. ? Cancer of the abdominal or pelvic lining (peritoneal cancer). ? Prostate. ? Pancreatic.  Cervical, Uterine, and Ovarian Cancer Your health care provider may recommend that you be screened regularly for cancer of the pelvic organs. These include your ovaries, uterus, and vagina. This screening involves a pelvic exam, which includes checking for microscopic changes to the surface of your cervix (Pap test).  For women ages 21-65, health care providers may recommend a pelvic exam and a Pap test every three years. For women ages 71-65, they may recommend the Pap test and pelvic exam, combined with testing for human papilloma virus (HPV), every five years. Some types of HPV increase your risk of cervical cancer. Testing for HPV may also be done on women of any age who have unclear Pap test results.  Other health care providers may not recommend any screening for nonpregnant women who are considered low risk for pelvic  cancer and have no symptoms. Ask your health care provider if a screening pelvic exam is right for you.  If you have had past treatment for cervical cancer or a condition that could lead to cancer, you need Pap tests and screening for cancer for at least 20 years after your treatment. If Pap tests have been discontinued for you, your risk factors (such as having a new sexual partner) need to be reassessed to determine if you should start having screenings again. Some women have medical problems that increase the chance of getting cervical cancer. In these cases, your health care provider may recommend that you have screening and Pap tests more often.  If you have a family history of uterine cancer or ovarian cancer, talk with your health care provider about genetic screening.  If you have vaginal bleeding after reaching menopause, tell your health care provider.  There are currently no reliable tests available to screen for ovarian cancer.  Lung Cancer Lung cancer screening is recommended for adults 68-68 years old who are at high risk for lung cancer because of a history of smoking. A yearly low-dose CT scan of the lungs is recommended if you:  Currently smoke.  Have a history of at least 30 pack-years of smoking and you currently smoke or have  quit within the past 15 years. A pack-year is smoking an average of one pack of cigarettes per day for one year.  Yearly screening should:  Continue until it has been 15 years since you quit.  Stop if you develop a health problem that would prevent you from having lung cancer treatment.  Colorectal Cancer  This type of cancer can be detected and can often be prevented.  Routine colorectal cancer screening usually begins at age 47 and continues through age 39.  If you have risk factors for colon cancer, your health care provider may recommend that you be screened at an earlier age.  If you have a family history of colorectal cancer, talk with  your health care provider about genetic screening.  Your health care provider may also recommend using home test kits to check for hidden blood in your stool.  A small camera at the end of a tube can be used to examine your colon directly (sigmoidoscopy or colonoscopy). This is done to check for the earliest forms of colorectal cancer.  Direct examination of the colon should be repeated every 5-10 years until age 83. However, if early forms of precancerous polyps or small growths are found or if you have a family history or genetic risk for colorectal cancer, you may need to be screened more often.  Skin Cancer  Check your skin from head to toe regularly.  Monitor any moles. Be sure to tell your health care provider: ? About any new moles or changes in moles, especially if there is a change in a mole's shape or color. ? If you have a mole that is larger than the size of a pencil eraser.  If any of your family members has a history of skin cancer, especially at a young age, talk with your health care provider about genetic screening.  Always use sunscreen. Apply sunscreen liberally and repeatedly throughout the day.  Whenever you are outside, protect yourself by wearing long sleeves, pants, a wide-brimmed hat, and sunglasses.  What should I know about osteoporosis? Osteoporosis is a condition in which bone destruction happens more quickly than new bone creation. After menopause, you may be at an increased risk for osteoporosis. To help prevent osteoporosis or the bone fractures that can happen because of osteoporosis, the following is recommended:  If you are 80-67 years old, get at least 1,000 mg of calcium and at least 600 mg of vitamin D per day.  If you are older than age 24 but younger than age 60, get at least 1,200 mg of calcium and at least 600 mg of vitamin D per day.  If you are older than age 27, get at least 1,200 mg of calcium and at least 800 mg of vitamin D per  day.  Smoking and excessive alcohol intake increase the risk of osteoporosis. Eat foods that are rich in calcium and vitamin D, and do weight-bearing exercises several times each week as directed by your health care provider. What should I know about how menopause affects my mental health? Depression may occur at any age, but it is more common as you become older. Common symptoms of depression include:  Low or sad mood.  Changes in sleep patterns.  Changes in appetite or eating patterns.  Feeling an overall lack of motivation or enjoyment of activities that you previously enjoyed.  Frequent crying spells.  Talk with your health care provider if you think that you are experiencing depression. What should I know about  immunizations? It is important that you get and maintain your immunizations. These include:  Tetanus, diphtheria, and pertussis (Tdap) booster vaccine.  Influenza every year before the flu season begins.  Pneumonia vaccine.  Shingles vaccine.  Your health care provider may also recommend other immunizations. This information is not intended to replace advice given to you by your health care provider. Make sure you discuss any questions you have with your health care provider. Document Released: 10/02/2005 Document Revised: 02/28/2016 Document Reviewed: 05/14/2015 Elsevier Interactive Patient Education  2018 [employer].

## 2017-09-09 ENCOUNTER — Other Ambulatory Visit: Payer: Self-pay | Admitting: Family Medicine

## 2017-09-09 ENCOUNTER — Encounter: Payer: Self-pay | Admitting: Family Medicine

## 2017-09-09 DIAGNOSIS — R928 Other abnormal and inconclusive findings on diagnostic imaging of breast: Secondary | ICD-10-CM

## 2017-09-09 LAB — CYTOLOGY - PAP
Diagnosis: NEGATIVE
HPV: NOT DETECTED

## 2017-09-09 LAB — HEPATITIS C ANTIBODY
HEP C AB: NONREACTIVE
SIGNAL TO CUT-OFF: 0.01 (ref <1.00)

## 2017-09-10 ENCOUNTER — Ambulatory Visit: Payer: BLUE CROSS/BLUE SHIELD | Admitting: Family Medicine

## 2017-09-21 ENCOUNTER — Ambulatory Visit: Payer: Self-pay | Admitting: Orthopedic Surgery

## 2017-09-21 NOTE — Progress Notes (Signed)
Please place orders in Epic as patient is being scheduled for a pre-op appointment! Thank you! 

## 2017-09-24 ENCOUNTER — Encounter: Payer: Self-pay | Admitting: Family Medicine

## 2017-09-24 ENCOUNTER — Telehealth: Payer: Self-pay

## 2017-09-24 NOTE — Telephone Encounter (Signed)
PA initiated via Covermymeds; KEY: [id number]. Awaiting determination.

## 2017-09-29 MED ORDER — OXYBUTYNIN CHLORIDE ER 10 MG PO TB24: 10.0000 mg | ORAL_TABLET | Freq: Every day | ORAL | 9 refills | Status: DC

## 2017-09-29 NOTE — Telephone Encounter (Signed)
Pt following up on  PA denial. She would like to know what her options are. Please advise.

## 2017-09-29 NOTE — Telephone Encounter (Signed)
Called and LMOM- I did sub of oxybutynin 10 ER  Meds ordered this encounter  Medications  . oxybutynin (DITROPAN-XL) 10 MG 24 hr tablet    Sig: Take 1 tablet (10 mg total) by mouth at bedtime.    Dispense:  30 tablet    Refill:  9

## 2017-09-29 NOTE — Telephone Encounter (Signed)
PA denied. Preferred alternatives oxybutynin IR or XR.

## 2017-09-30 MED ORDER — OXYBUTYNIN CHLORIDE ER 10 MG PO TB24: 10.0000 mg | ORAL_TABLET | Freq: Every day | ORAL | 9 refills | Status: DC

## 2017-09-30 MED FILL — OXYBUTYNIN CL ER 10 MG TAB: 10 | 30 days supply | Qty: 30 | Fill #0

## 2017-09-30 MED FILL — TOPIRAMATE 200 MG TABLET: 200 | 90 days supply | Qty: 180 | Fill #0

## 2017-09-30 NOTE — Telephone Encounter (Signed)
Patient states prescription was sent to wrong pharmacy. I have sent new rx to correct pharmacy.

## 2017-09-30 NOTE — Addendum Note (Signed)
Addended by: Wynonia Musty A on: 09/30/2017 09:03 AM   Modules accepted: Orders

## 2017-10-05 ENCOUNTER — Ambulatory Visit: Payer: BLUE CROSS/BLUE SHIELD | Admitting: Family

## 2017-10-12 ENCOUNTER — Ambulatory Visit: Payer: Self-pay | Admitting: Orthopedic Surgery

## 2017-10-12 NOTE — H&P (Signed)
[name], [name] (57yo, F) DOB 08-07-1961   Chief Complaint Right Knee Pain H&P for right TKA on 0304/2019 at [place] Team Primary Care Provider: [place]: [place], [place], [place] [address], Ph [phone], Fax [phone]   Patient's Pharmacies [facility]): [place], [place] [address], Ph [phone], Fax [phone]   Vitals Ht: 5 ft 6.5 in  Pain Scale: 5 BP - 128/82 Pulse - 76    Allergies Reviewed Allergies CODEINE: - Nausea is long term. Able to take Hydrocodone if needed.     Medications Reviewed Medications Aleve 09/02/17   entered Glendale Chard Botox Internal Note: for headaches 09/02/17   entered Glendale Chard eletriptan 40 mg tablet 08/12/17   filled MEDIMPACT Fish Oil 1,000 mg (120 mg-180 mg) capsule Take by oral route. 09/02/17   entered Glendale Chard magnesium 09/02/17   entered Glendale Chard multivitamin 09/02/17   entered Glendale Chard oxybutynin chloride ER 10 mg tablet,extended release 24 hr 09/30/17   filled MEDIMPACT PriLOSEC OTC 20 mg tablet,delayed release Take by oral route. 09/02/17   entered Claiborne Billings Hancock topiramate 200 mg tablet 09/30/17   filled MEDIMPACT turmeric 09/02/17   entered Glendale Chard Tylenol PM 09/02/17   entered Glendale Chard venlafaxine ER 75 mg capsule,extended release 24 hr 08/12/17   filled MEDIMPACT ZyrTEC 10 mg capsule Take by oral route. 09/02/17   entered Glendale Chard             Problems Reviewed Problems Osteoarthritis of knee History of left total knee replacement   Family History Reviewed Family History Brother - Heart disease (onset age: 87)    Social History Reviewed Social History Smoking Status: Never smoker Chewing tobacco: none Alcohol intake: Occasional Hand Dominance: Right Medical Power of Attorney: N    Surgical History Reviewed Surgical  History Biopsy of breast Tonsillectomy Total knee arthroplasty - Left Total Knee    Past Medical History Reviewed Past Medical History Anemia: Y GERD/Reflux: Y Headaches: Y Joint Pain: Y Migraines: Y Notes: Heart Murmur    HPI The patient comes in today for her H&P for her right TKA scheduled on 10/25/2017 at [place] has been seen for ongoing right knee  pain that has been present for years. Alleviating Factors: ice; NSAIDs (Aleve). Aggravating Factors: weightbearing (long hours at work)  Associated Symptoms: instability; pain that is now becoming more noticeable in the left TKA due to favoring . She had a her previous left TKA done in March of 2013  She has been treated for the right knee with pprevious injections. Loralie's RIGHT knee has gotten progressively worse. She has known end-stage arthritis in the knee and the injection is no longer beneficial. Pain is along the anterior medial aspect of the knee. Starting throughout the day and now hurting at night also. It is also affecting her at work. She does not get swelling. The knee occasionally wants to give out on her. Her LEFT knee was doing great but she started having some soreness but she is put extra pressure on it. She does state she is ready to go ahead and get the RIGHT knee replaced at this time. Radiographs AP both knees and lateral show the prosthesis on the LEFT in excellent position with no periprosthetic abnormalities. On the RIGHT she has bone-on-bone arthritis medial and patellofemoral with varus deformity. She has end-stage arthritis of the LEFT  knee with progressively worsening pain and dysfunction. At this point the most predictable means of improving pain and function is total knee arthroplasty. We did discuss the changes in pain management and rehab now compared to when she had the other side done. She is very familiar with the rehab. She would like to go ahead and proceed with surgery as  planned.    ROS Constitutional: Constitutional: no fever, chills, night sweats, or significant weight loss.  Cardiovascular: Cardiovascular: no palpitations or chest pain.  Respiratory: Respiratory: no cough or shortness of breath and No COPD.  Gastrointestinal: Gastrointestinal: no vomiting or nausea.  Musculoskeletal: Musculoskeletal: Joint Pain.  Neurologic: Neurologic: no numbness, tingling, or difficulty with balance.    Physical Exam Patient is a 57 year old female.  General Mental Status - Alert, cooperative and good historian. General Appearance - pleasant, Not in acute distress. Orientation - Oriented X3. Build & Nutrition - Well nourished and Well developed.  Head and Neck Head - normocephalic, atraumatic . Neck Global Assessment - supple, no bruit auscultated on the right, no bruit auscultated on the left.  Eye (Wears contacts) Pupil - Bilateral - PERR Motion - Bilateral - EOMI.  Chest and Lung Exam Auscultation Breath sounds - clear at anterior chest wall and clear at posterior chest wall. Adventitious sounds - No Adventitious sounds.  Cardiovascular Auscultation Rhythm - Regular rate and rhythm. Heart Sounds - S1 WNL and S2 WNL. Murmurs & Other Heart Sounds - Auscultation of the heart reveals - No Murmurs.  Abdomen Palpation/Percussion Tenderness - Abdomen is non-tender to palpation. Abdomen is soft. Auscultation Auscultation of the abdomen reveals - Bowel sounds normal.  Genitourinary Note: Not done, not pertinent to present illness  Musculoskeletal Her RIGHT knee shows no effusion. She has a varus deformity. Range of motion is 5-125. She is tender medial greater than lateral. There is no instability noted about the knee. Her gait pattern is significantly antalgic on the RIGHT. Her LEFT knee shows no swelling with excellent alignment. Range 0-125 with no tenderness or instability. Pulse sensation and motor are intact both lower  extremities.  Radiographs AP both knees and lateral show the prosthesis on the LEFT in excellent position with no periprosthetic abnormalities. On the RIGHT she has bone-on-bone arthritis medial and patellofemoral with varus deformity    Assessment / Plan 1. Osteoarthritis of right knee joint M17.11: Unilateral primary osteoarthritis, right knee  Patient Instructions Surgical Plans: Right Total Knee Replacement Disposition: Lives Alone, Home to her mother's, HHPT PCP: [facility] Anesthesia Issues: None with previous knee surgery Patient was instructed on what medications to stop prior to surgery. - Follow up visit in 2 weeks with Dr. Wynelle Link - Begin physical therapy following surgery - Pre-operative lab work as pre Pre-Surgical Testing - Prescriptions will be provided in hospital at time of discharge  Return to [place], MD for [place] at [facility] on 11/09/2017 at 03:15 PM  Encounter signed-off by Mickel Crow, PA-C

## 2017-10-12 NOTE — H&P (View-Only) (Signed)
[name], [name] (57yo, F) DOB 05-Mar-1961   Chief Complaint Right Knee Pain H&P for right TKA on 0304/2019 at [place] Team Primary Care Provider: [place]: [place], [place], [place] [address], Ph [phone], Fax [phone]   Patient's Pharmacies [facility]): [place], [place] [address], Ph [phone], Fax [phone]   Vitals Ht: 5 ft 6.5 in  Pain Scale: 5 BP - 128/82 Pulse - 76    Allergies Reviewed Allergies CODEINE: - Nausea is long term. Able to take Hydrocodone if needed.     Medications Reviewed Medications Aleve 09/02/17   entered Glendale Chard Botox Internal Note: for headaches 09/02/17   entered Glendale Chard eletriptan 40 mg tablet 08/12/17   filled MEDIMPACT Fish Oil 1,000 mg (120 mg-180 mg) capsule Take by oral route. 09/02/17   entered Glendale Chard magnesium 09/02/17   entered Glendale Chard multivitamin 09/02/17   entered Glendale Chard oxybutynin chloride ER 10 mg tablet,extended release 24 hr 09/30/17   filled MEDIMPACT PriLOSEC OTC 20 mg tablet,delayed release Take by oral route. 09/02/17   entered Claiborne Billings Hancock topiramate 200 mg tablet 09/30/17   filled MEDIMPACT turmeric 09/02/17   entered Glendale Chard Tylenol PM 09/02/17   entered Glendale Chard venlafaxine ER 75 mg capsule,extended release 24 hr 08/12/17   filled MEDIMPACT ZyrTEC 10 mg capsule Take by oral route. 09/02/17   entered Glendale Chard             Problems Reviewed Problems Osteoarthritis of knee History of left total knee replacement   Family History Reviewed Family History Brother - Heart disease (onset age: 67)    Social History Reviewed Social History Smoking Status: Never smoker Chewing tobacco: none Alcohol intake: Occasional Hand Dominance: Right Medical Power of Attorney: N    Surgical History Reviewed Surgical  History Biopsy of breast Tonsillectomy Total knee arthroplasty - Left Total Knee    Past Medical History Reviewed Past Medical History Anemia: Y GERD/Reflux: Y Headaches: Y Joint Pain: Y Migraines: Y Notes: Heart Murmur    HPI The patient comes in today for her H&P for her right TKA scheduled on 10/25/2017 at [place] has been seen for ongoing right knee  pain that has been present for years. Alleviating Factors: ice; NSAIDs (Aleve). Aggravating Factors: weightbearing (long hours at work)  Associated Symptoms: instability; pain that is now becoming more noticeable in the left TKA due to favoring . She had a her previous left TKA done in March of 2013  She has been treated for the right knee with pprevious injections. Joesphine's RIGHT knee has gotten progressively worse. She has known end-stage arthritis in the knee and the injection is no longer beneficial. Pain is along the anterior medial aspect of the knee. Starting throughout the day and now hurting at night also. It is also affecting her at work. She does not get swelling. The knee occasionally wants to give out on her. Her LEFT knee was doing great but she started having some soreness but she is put extra pressure on it. She does state she is ready to go ahead and get the RIGHT knee replaced at this time. Radiographs AP both knees and lateral show the prosthesis on the LEFT in excellent position with no periprosthetic abnormalities. On the RIGHT she has bone-on-bone arthritis medial and patellofemoral with varus deformity. She has end-stage arthritis of the LEFT  knee with progressively worsening pain and dysfunction. At this point the most predictable means of improving pain and function is total knee arthroplasty. We did discuss the changes in pain management and rehab now compared to when she had the other side done. She is very familiar with the rehab. She would like to go ahead and proceed with surgery as  planned.    ROS Constitutional: Constitutional: no fever, chills, night sweats, or significant weight loss.  Cardiovascular: Cardiovascular: no palpitations or chest pain.  Respiratory: Respiratory: no cough or shortness of breath and No COPD.  Gastrointestinal: Gastrointestinal: no vomiting or nausea.  Musculoskeletal: Musculoskeletal: Joint Pain.  Neurologic: Neurologic: no numbness, tingling, or difficulty with balance.    Physical Exam Patient is a 57 year old female.  General Mental Status - Alert, cooperative and good historian. General Appearance - pleasant, Not in acute distress. Orientation - Oriented X3. Build & Nutrition - Well nourished and Well developed.  Head and Neck Head - normocephalic, atraumatic . Neck Global Assessment - supple, no bruit auscultated on the right, no bruit auscultated on the left.  Eye (Wears contacts) Pupil - Bilateral - PERR Motion - Bilateral - EOMI.  Chest and Lung Exam Auscultation Breath sounds - clear at anterior chest wall and clear at posterior chest wall. Adventitious sounds - No Adventitious sounds.  Cardiovascular Auscultation Rhythm - Regular rate and rhythm. Heart Sounds - S1 WNL and S2 WNL. Murmurs & Other Heart Sounds - Auscultation of the heart reveals - No Murmurs.  Abdomen Palpation/Percussion Tenderness - Abdomen is non-tender to palpation. Abdomen is soft. Auscultation Auscultation of the abdomen reveals - Bowel sounds normal.  Genitourinary Note: Not done, not pertinent to present illness  Musculoskeletal Her RIGHT knee shows no effusion. She has a varus deformity. Range of motion is 5-125. She is tender medial greater than lateral. There is no instability noted about the knee. Her gait pattern is significantly antalgic on the RIGHT. Her LEFT knee shows no swelling with excellent alignment. Range 0-125 with no tenderness or instability. Pulse sensation and motor are intact both lower  extremities.  Radiographs AP both knees and lateral show the prosthesis on the LEFT in excellent position with no periprosthetic abnormalities. On the RIGHT she has bone-on-bone arthritis medial and patellofemoral with varus deformity    Assessment / Plan 1. Osteoarthritis of right knee joint M17.11: Unilateral primary osteoarthritis, right knee  Patient Instructions Surgical Plans: Right Total Knee Replacement Disposition: Lives Alone, Home to her mother's, HHPT PCP: [facility] Anesthesia Issues: None with previous knee surgery Patient was instructed on what medications to stop prior to surgery. - Follow up visit in 2 weeks with Dr. Wynelle Link - Begin physical therapy following surgery - Pre-operative lab work as pre Pre-Surgical Testing - Prescriptions will be provided in hospital at time of discharge  Return to [place], MD for [place] at [facility] on 11/09/2017 at 03:15 PM  Encounter signed-off by Mickel Crow, PA-C

## 2017-10-13 ENCOUNTER — Encounter (HOSPITAL_COMMUNITY): Payer: Self-pay

## 2017-10-13 NOTE — Patient Instructions (Signed)
Your procedure is scheduled on: Monday, October 25, 2017   Surgery Time:  1:45PM-2:35PM   Report to [facility]  Entrance    Report to admitting at 11:15 AM   Call this number if you have problems the morning of surgery [phone]   Do not eat food:After Midnight.   Do NOT smoke after Midnight   CLEAR LIQUID DIET UNTIL 7:30 MORNING OF SURGERY  Foods Allowed                                                                     Foods Excluded  Coffee and tea, regular and decaf                             liquids that you cannot  Plain Jell-O in any flavor                                             see through such as: Fruit ices (not with fruit pulp)                                     milk, soups, orange juice  Iced Popsicles                                    All solid food Carbonated beverages, regular and diet                                    Cranberry, grape and apple juices Sports drinks like Gatorade Lightly seasoned clear broth or consume(fat free) Sugar, honey syrup  Sample Menu Breakfast                                Lunch                                     Supper Cranberry juice                    Beef broth                            Chicken broth Jell-O                                     Grape juice                           Apple juice Coffee or tea  Jell-O                                      Popsicle                                                Coffee or tea                        Coffee or tea    Take these medicines the morning of surgery with A SIP OF WATER: Allegra, Omeprazole, Venlafaxine                               You may not have any metal on your body including hair pins, jewelry, and body piercings             Do not wear make-up, lotions, powders, perfumes/cologne, or deodorant             Do not wear nail polish.  Do not shave  48 hours prior to surgery.               Do not bring valuables to the  hospital. [place].   Contacts, dentures or bridgework may not be worn into surgery.   Leave suitcase in the car. After surgery it may be brought to your room.   Special Instructions: Bring a copy of your healthcare power of attorney and living will documents         the day of surgery if you haven't scanned them in before.              Please read over the following fact sheets you were given:  [facility] - Preparing for Surgery Before surgery, you can play an important role.  Because skin is not sterile, your skin needs to be as free of germs as possible.  You can reduce the number of germs on your skin by washing with CHG (chlorahexidine gluconate) soap before surgery.  CHG is an antiseptic cleaner which kills germs and bonds with the skin to continue killing germs even after washing. Please DO NOT use if you have an allergy to CHG or antibacterial soaps.  If your skin becomes reddened/irritated stop using the CHG and inform your nurse when you arrive at Short Stay. Do not shave (including legs and underarms) for at least 48 hours prior to the first CHG shower.  You may shave your face/neck.  Please follow these instructions carefully:  1.  Shower with CHG Soap the night before surgery and the  morning of surgery.  2.  If you choose to wash your hair, wash your hair first as usual with your normal  shampoo.  3.  After you shampoo, rinse your hair and body thoroughly to remove the shampoo.                             4.  Use CHG as you would any other liquid soap.  You can apply chg directly to the skin and wash.  Gently  with a scrungie or clean washcloth.  5.  Apply the CHG Soap to your body ONLY FROM THE NECK DOWN.   Do   not use on face/ open                           Wound or open sores. Avoid contact with eyes, ears mouth and   genitals (private parts).                       Wash face,  Genitals (private parts) with your normal soap.              6.  Wash thoroughly, paying special attention to the area where your    surgery  will be performed.  7.  Thoroughly rinse your body with warm water from the neck down.  8.  DO NOT shower/wash with your normal soap after using and rinsing off the CHG Soap.                9.  Pat yourself dry with a clean towel.            10.  Wear clean pajamas.            11.  Place clean sheets on your bed the night of your first shower and do not  sleep with pets. Day of Surgery : Do not apply any lotions/deodorants the morning of surgery.  Please wear clean clothes to the hospital/surgery center.  FAILURE TO FOLLOW THESE INSTRUCTIONS MAY RESULT IN THE CANCELLATION OF YOUR SURGERY  PATIENT SIGNATURE_________________________________  NURSE SIGNATURE__________________________________  ________________________________________________________________________   [name]  An incentive spirometer is a tool that can help keep your lungs clear and active. This tool measures how well you are filling your lungs with each breath. Taking long deep breaths may help reverse or decrease the chance of developing breathing (pulmonary) problems (especially infection) following:  A long period of time when you are unable to move or be active. BEFORE THE PROCEDURE   If the spirometer includes an indicator to show your best effort, your nurse or respiratory therapist will set it to a desired goal.  If possible, sit up straight or lean slightly forward. Try not to slouch.  Hold the incentive spirometer in an upright position. INSTRUCTIONS FOR USE  1. Sit on the edge of your bed if possible, or sit up as far as you can in bed or on a chair. 2. Hold the incentive spirometer in an upright position. 3. Breathe out normally. 4. Place the mouthpiece in your mouth and seal your lips tightly around it. 5. Breathe in slowly and as deeply as possible, raising the piston or the ball toward the top of the  column. 6. Hold your breath for 3-5 seconds or for as long as possible. Allow the piston or ball to fall to the bottom of the column. 7. Remove the mouthpiece from your mouth and breathe out normally. 8. Rest for a few seconds and repeat Steps 1 through 7 at least 10 times every 1-2 hours when you are awake. Take your time and take a few normal breaths between deep breaths. 9. The spirometer may include an indicator to show your best effort. Use the indicator as a goal to work toward during each repetition. 10. After each set of 10 deep breaths, practice coughing to be sure your lungs are clear. If you have an incision (the  cut made at the time of surgery), support your incision when coughing by placing a pillow or rolled up towels firmly against it. Once you are able to get out of bed, walk around indoors and cough well. You may stop using the incentive spirometer when instructed by your caregiver.  RISKS AND COMPLICATIONS  Take your time so you do not get dizzy or light-headed.  If you are in pain, you may need to take or ask for pain medication before doing incentive spirometry. It is harder to take a deep breath if you are having pain. AFTER USE  Rest and breathe slowly and easily.  It can be helpful to keep track of a log of your progress. Your caregiver can provide you with a simple table to help with this. If you are using the spirometer at home, follow these instructions: [place] IF:   You are having difficultly using the spirometer.  You have trouble using the spirometer as often as instructed.  Your pain medication is not giving enough relief while using the spirometer.  You develop fever of 100.5 F (38.1 C) or higher. SEEK IMMEDIATE MEDICAL CARE IF:   You cough up bloody sputum that had not been present before.  You develop fever of 102 F (38.9 C) or greater.  You develop worsening pain at or near the incision site. MAKE SURE YOU:   Understand these  instructions.  Will watch your condition.  Will get help right away if you are not doing well or get worse. Document Released: 12/21/2006 Document Revised: 11/02/2011 Document Reviewed: 02/21/2007 Exit[employer] Patient Information 2014 [employer], [place].   ________________________________________________________________________  WHAT IS A BLOOD TRANSFUSION? Blood Transfusion Information  A transfusion is the replacement of blood or some of its parts. Blood is made up of multiple cells which provide different functions.  Red blood cells carry oxygen and are used for blood loss replacement.  White blood cells fight against infection.  Platelets control bleeding.  Plasma helps clot blood.  Other blood products are available for specialized needs, such as hemophilia or other clotting disorders. BEFORE THE TRANSFUSION  Who gives blood for transfusions?   Healthy volunteers who are fully evaluated to make sure their blood is safe. This is blood bank blood. Transfusion therapy is the safest it has ever been in the practice of medicine. Before blood is taken from a donor, a complete history is taken to make sure that person has no history of diseases nor engages in risky social behavior (examples are intravenous drug use or sexual activity with multiple partners). The donor's travel history is screened to minimize risk of transmitting infections, such as malaria. The donated blood is tested for signs of infectious diseases, such as HIV and hepatitis. The blood is then tested to be sure it is compatible with you in order to minimize the chance of a transfusion reaction. If you or a relative donates blood, this is often done in anticipation of surgery and is not appropriate for emergency situations. It takes many days to process the donated blood. RISKS AND COMPLICATIONS Although transfusion therapy is very safe and saves many lives, the main dangers of transfusion include:   Getting an infectious  disease.  Developing a transfusion reaction. This is an allergic reaction to something in the blood you were given. Every precaution is taken to prevent this. The decision to have a blood transfusion has been considered carefully by your caregiver before blood is given. Blood is not given unless the  benefits outweigh the risks. AFTER THE TRANSFUSION  Right after receiving a blood transfusion, you will usually feel much better and more energetic. This is especially true if your red blood cells have gotten low (anemic). The transfusion raises the level of the red blood cells which carry oxygen, and this usually causes an energy increase.  The nurse administering the transfusion will monitor you carefully for complications. HOME CARE INSTRUCTIONS  No special instructions are needed after a transfusion. You may find your energy is better. Speak with your caregiver about any limitations on activity for underlying diseases you may have. SEEK MEDICAL CARE IF:   Your condition is not improving after your transfusion.  You develop redness or irritation at the intravenous (IV) site. SEEK IMMEDIATE MEDICAL CARE IF:  Any of the following symptoms occur over the next 12 hours:  Shaking chills.  You have a temperature by mouth above 102 F (38.9 C), not controlled by medicine.  Chest, back, or muscle pain.  People around you feel you are not acting correctly or are confused.  Shortness of breath or difficulty breathing.  Dizziness and fainting.  You get a rash or develop hives.  You have a decrease in urine output.  Your urine turns a dark color or changes to pink, red, or brown. Any of the following symptoms occur over the next 10 days:  You have a temperature by mouth above 102 F (38.9 C), not controlled by medicine.  Shortness of breath.  Weakness after normal activity.  The white part of the eye turns yellow (jaundice).  You have a decrease in the amount of urine or are  urinating less often.  Your urine turns a dark color or changes to pink, red, or brown. Document Released: 08/07/2000 Document Revised: 11/02/2011 Document Reviewed: 03/26/2008 [facility] Patient Information 2014 [place], [place].  _______________________________________________________________________

## 2017-10-13 NOTE — Pre-Procedure Instructions (Signed)
The following are in epic: Last office visit note from Dr. Lenna Sciara. [place], EKG, Hgb A1C (5.7) 09/08/2017

## 2017-10-18 ENCOUNTER — Encounter (HOSPITAL_COMMUNITY)
Admission: RE | Admit: 2017-10-18 | Discharge: 2017-10-18 | Disposition: A | Discharge status: Home / Self Care | Payer: 59 | Source: Ambulatory Visit | Attending: Orthopedic Surgery | Admitting: Orthopedic Surgery

## 2017-10-18 ENCOUNTER — Encounter (HOSPITAL_COMMUNITY): Payer: Self-pay

## 2017-10-18 ENCOUNTER — Other Ambulatory Visit: Payer: Self-pay

## 2017-10-18 DIAGNOSIS — Z01812 Encounter for preprocedural laboratory examination: Secondary | ICD-10-CM | POA: Insufficient documentation

## 2017-10-18 DIAGNOSIS — M1711 Unilateral primary osteoarthritis, right knee: Secondary | ICD-10-CM | POA: Insufficient documentation

## 2017-10-18 HISTORY — DX: Ulcerative colitis, unspecified, without complications: K51.90

## 2017-10-18 HISTORY — DX: Osteoarthritis of knee, unspecified: M17.9

## 2017-10-18 HISTORY — DX: Unilateral primary osteoarthritis, unspecified knee: M17.10

## 2017-10-18 HISTORY — DX: Hyperlipidemia, unspecified: E78.5

## 2017-10-18 HISTORY — DX: Unspecified perforation of tympanic membrane, right ear: H72.91

## 2017-10-18 HISTORY — DX: Epistaxis: R04.0

## 2017-10-18 HISTORY — DX: Dizziness and giddiness: R42

## 2017-10-18 HISTORY — DX: Frequency of micturition: R35.0

## 2017-10-18 HISTORY — DX: Postmenopausal bleeding: N95.0

## 2017-10-18 HISTORY — DX: Pneumonia, unspecified organism: J18.9

## 2017-10-18 HISTORY — DX: Urgency of urination: R39.15

## 2017-10-18 LAB — CBC
HEMATOCRIT: 38.5 % (ref 36.0–46.0)
HEMOGLOBIN: 13 g/dL (ref 12.0–15.0)
MCH: 31.1 pg (ref 26.0–34.0)
MCHC: 33.8 g/dL (ref 30.0–36.0)
MCV: 92.1 fL (ref 78.0–100.0)
PLATELETS: 250 10*3/uL (ref 150–400)
RBC: 4.18 MIL/uL (ref 3.87–5.11)
RDW: 13.7 % (ref 11.5–15.5)
WBC: 5.8 10*3/uL (ref 4.0–10.5)

## 2017-10-18 LAB — COMPREHENSIVE METABOLIC PANEL
ALK PHOS: 62 U/L (ref 38–126)
ALT: 14 U/L (ref 14–54)
AST: 19 U/L (ref 15–41)
Albumin: 4.2 g/dL (ref 3.5–5.0)
Anion gap: 9 (ref 5–15)
BUN: 13 mg/dL (ref 6–20)
CO2: 21 mmol/L — ABNORMAL LOW (ref 22–32)
CREATININE: 1 mg/dL (ref 0.44–1.00)
Calcium: 9.3 mg/dL (ref 8.9–10.3)
Chloride: 109 mmol/L (ref 101–111)
Glucose, Bld: 95 mg/dL (ref 65–99)
Potassium: 4.3 mmol/L (ref 3.5–5.1)
Sodium: 139 mmol/L (ref 135–145)
TOTAL PROTEIN: 7.1 g/dL (ref 6.5–8.1)
Total Bilirubin: 0.8 mg/dL (ref 0.3–1.2)

## 2017-10-18 LAB — SURGICAL PCR SCREEN
MRSA, PCR: NEGATIVE
STAPHYLOCOCCUS AUREUS: NEGATIVE

## 2017-10-18 LAB — APTT: aPTT: 28 seconds (ref 24–36)

## 2017-10-18 LAB — PROTIME-INR
INR: 0.98
PROTHROMBIN TIME: 12.9 s (ref 11.4–15.2)

## 2017-10-18 NOTE — Pre-Procedure Instructions (Signed)
CMP results 10/18/2017 faxed to Dr. Wynelle Link via epic.

## 2017-10-19 MED FILL — BOTOX 100 UNITS VIAL: 100 | 1 days supply | Qty: 2 | Fill #0

## 2017-10-21 DIAGNOSIS — M791 Myalgia, unspecified site: Secondary | ICD-10-CM | POA: Diagnosis not present

## 2017-10-21 DIAGNOSIS — G518 Other disorders of facial nerve: Secondary | ICD-10-CM | POA: Diagnosis not present

## 2017-10-21 DIAGNOSIS — G43019 Migraine without aura, intractable, without status migrainosus: Secondary | ICD-10-CM | POA: Diagnosis not present

## 2017-10-21 DIAGNOSIS — G43719 Chronic migraine without aura, intractable, without status migrainosus: Secondary | ICD-10-CM | POA: Diagnosis not present

## 2017-10-21 DIAGNOSIS — M542 Cervicalgia: Secondary | ICD-10-CM | POA: Diagnosis not present

## 2017-10-24 MED ORDER — TRANEXAMIC ACID 1000 MG/10ML IV SOLN
1000.0000 mg | INTRAVENOUS | Status: AC
  Administered 2017-10-25: 1000 mg via INTRAVENOUS
  Filled 2017-10-24: qty 1100

## 2017-10-24 MED ORDER — BUPIVACAINE LIPOSOME 1.3 % IJ SUSP
20.0000 mL | Freq: Once | INTRAMUSCULAR | Status: DC
  Filled 2017-10-24: qty 20

## 2017-10-25 ENCOUNTER — Inpatient Hospital Stay (HOSPITAL_COMMUNITY)
Admission: RE | Admit: 2017-10-25 | Discharge: 2017-10-28 | DRG: 470 | Disposition: A | Discharge status: Home Health | Payer: 59 | Source: Ambulatory Visit | Attending: Orthopedic Surgery | Admitting: Orthopedic Surgery

## 2017-10-25 ENCOUNTER — Encounter (HOSPITAL_COMMUNITY): Payer: Self-pay | Admitting: *Deleted

## 2017-10-25 ENCOUNTER — Other Ambulatory Visit: Payer: Self-pay

## 2017-10-25 ENCOUNTER — Inpatient Hospital Stay (HOSPITAL_COMMUNITY): Payer: 59 | Admitting: Anesthesiology

## 2017-10-25 ENCOUNTER — Telehealth (HOSPITAL_COMMUNITY): Payer: Self-pay | Admitting: *Deleted

## 2017-10-25 ENCOUNTER — Encounter (HOSPITAL_COMMUNITY)
Admission: RE | Disposition: A | Discharge status: Home Health | Payer: Self-pay | Source: Ambulatory Visit | Attending: Orthopedic Surgery

## 2017-10-25 DIAGNOSIS — Z6829 Body mass index (BMI) 29.0-29.9, adult: Secondary | ICD-10-CM | POA: Diagnosis not present

## 2017-10-25 DIAGNOSIS — F329 Major depressive disorder, single episode, unspecified: Secondary | ICD-10-CM | POA: Diagnosis present

## 2017-10-25 DIAGNOSIS — M1711 Unilateral primary osteoarthritis, right knee: Principal | ICD-10-CM | POA: Diagnosis present

## 2017-10-25 DIAGNOSIS — E669 Obesity, unspecified: Secondary | ICD-10-CM | POA: Diagnosis present

## 2017-10-25 DIAGNOSIS — Z96652 Presence of left artificial knee joint: Secondary | ICD-10-CM | POA: Diagnosis present

## 2017-10-25 DIAGNOSIS — E785 Hyperlipidemia, unspecified: Secondary | ICD-10-CM | POA: Diagnosis present

## 2017-10-25 DIAGNOSIS — M1710 Unilateral primary osteoarthritis, unspecified knee: Secondary | ICD-10-CM | POA: Diagnosis present

## 2017-10-25 DIAGNOSIS — K219 Gastro-esophageal reflux disease without esophagitis: Secondary | ICD-10-CM | POA: Diagnosis present

## 2017-10-25 DIAGNOSIS — M179 Osteoarthritis of knee, unspecified: Secondary | ICD-10-CM | POA: Diagnosis present

## 2017-10-25 DIAGNOSIS — G8918 Other acute postprocedural pain: Secondary | ICD-10-CM | POA: Diagnosis not present

## 2017-10-25 HISTORY — PX: TOTAL KNEE ARTHROPLASTY: SHX125

## 2017-10-25 LAB — TYPE AND SCREEN
ABO/RH(D): AB NEG
Antibody Screen: NEGATIVE

## 2017-10-25 SURGERY — ARTHROPLASTY, KNEE, TOTAL
Anesthesia: Spinal | Site: Knee | Laterality: Right

## 2017-10-25 MED ORDER — SODIUM CHLORIDE 0.9 % IR SOLN
Status: DC | PRN
  Administered 2017-10-25: 1000 mL

## 2017-10-25 MED ORDER — ACETAMINOPHEN 10 MG/ML IV SOLN
1000.0000 mg | Freq: Once | INTRAVENOUS | Status: AC
  Administered 2017-10-25: 1000 mg via INTRAVENOUS
  Filled 2017-10-25: qty 100

## 2017-10-25 MED ORDER — SODIUM CHLORIDE 0.9 % IJ SOLN
INTRAMUSCULAR | Status: DC | PRN
  Administered 2017-10-25: 60 mL

## 2017-10-25 MED ORDER — ONDANSETRON HCL 4 MG/2ML IJ SOLN
INTRAMUSCULAR | Status: DC | PRN
  Administered 2017-10-25: 4 mg via INTRAVENOUS

## 2017-10-25 MED ORDER — KETOROLAC TROMETHAMINE 30 MG/ML IJ SOLN
30.0000 mg | Freq: Once | INTRAMUSCULAR | Status: DC | PRN
  Administered 2017-10-25: 30 mg via INTRAVENOUS

## 2017-10-25 MED ORDER — ONDANSETRON HCL 4 MG/2ML IJ SOLN
INTRAMUSCULAR | Status: AC
  Administered 2017-10-25: 4 mg via INTRAVENOUS
  Filled 2017-10-25: qty 2

## 2017-10-25 MED ORDER — METHOCARBAMOL 500 MG PO TABS
500.0000 mg | ORAL_TABLET | Freq: Four times a day (QID) | ORAL | Status: DC | PRN
  Administered 2017-10-26 – 2017-10-28 (×4): 500 mg via ORAL
  Filled 2017-10-25 (×4): qty 1

## 2017-10-25 MED ORDER — FENTANYL CITRATE (PF) 100 MCG/2ML IJ SOLN
50.0000 ug | INTRAMUSCULAR | Status: DC
  Filled 2017-10-25: qty 2

## 2017-10-25 MED ORDER — MIDAZOLAM HCL 2 MG/2ML IJ SOLN
1.0000 mg | INTRAMUSCULAR | Status: DC
  Filled 2017-10-25: qty 2

## 2017-10-25 MED ORDER — RIVAROXABAN 10 MG PO TABS
10.0000 mg | ORAL_TABLET | Freq: Every day | ORAL | Status: DC
  Administered 2017-10-26 – 2017-10-28 (×3): 10 mg via ORAL
  Filled 2017-10-25 (×3): qty 1

## 2017-10-25 MED ORDER — DEXAMETHASONE SODIUM PHOSPHATE 10 MG/ML IJ SOLN
10.0000 mg | Freq: Once | INTRAMUSCULAR | Status: AC
  Administered 2017-10-25: 10 mg via INTRAVENOUS

## 2017-10-25 MED ORDER — PHENOL 1.4 % MT LIQD
1.0000 | OROMUCOSAL | Status: DC | PRN
  Administered 2017-10-28: 1 via OROMUCOSAL
  Filled 2017-10-25: qty 177

## 2017-10-25 MED ORDER — LACTATED RINGERS IV SOLN
INTRAVENOUS | Status: DC
  Administered 2017-10-25 (×2): via INTRAVENOUS

## 2017-10-25 MED ORDER — ONDANSETRON HCL 4 MG/2ML IJ SOLN
4.0000 mg | Freq: Once | INTRAMUSCULAR | Status: AC
  Administered 2017-10-25: 4 mg via INTRAVENOUS

## 2017-10-25 MED ORDER — DEXAMETHASONE SODIUM PHOSPHATE 10 MG/ML IJ SOLN
INTRAMUSCULAR | Status: AC
  Filled 2017-10-25: qty 1

## 2017-10-25 MED ORDER — MEPERIDINE HCL 50 MG/ML IJ SOLN: 6.2500 mg | INTRAMUSCULAR | Status: DC | PRN

## 2017-10-25 MED ORDER — SODIUM CHLORIDE 0.9 % IV SOLN
INTRAVENOUS | Status: DC
  Administered 2017-10-25: 75 mL/h via INTRAVENOUS

## 2017-10-25 MED ORDER — MIDAZOLAM HCL 2 MG/2ML IJ SOLN
1.0000 mg | INTRAMUSCULAR | Status: DC | PRN
  Administered 2017-10-25: 2 mg via INTRAVENOUS

## 2017-10-25 MED ORDER — OXYCODONE HCL 5 MG PO TABS
10.0000 mg | ORAL_TABLET | ORAL | Status: DC | PRN
  Administered 2017-10-26: 10 mg via ORAL
  Filled 2017-10-25: qty 2

## 2017-10-25 MED ORDER — ONDANSETRON HCL 4 MG/2ML IJ SOLN
INTRAMUSCULAR | Status: AC
  Filled 2017-10-25: qty 2

## 2017-10-25 MED ORDER — TOPIRAMATE 100 MG PO TABS
400.0000 mg | ORAL_TABLET | Freq: Every day | ORAL | Status: DC
  Administered 2017-10-25 – 2017-10-27 (×3): 400 mg via ORAL
  Filled 2017-10-25 (×3): qty 4

## 2017-10-25 MED ORDER — SODIUM CHLORIDE 0.9 % IJ SOLN
INTRAMUSCULAR | Status: AC
  Filled 2017-10-25: qty 10

## 2017-10-25 MED ORDER — POLYETHYLENE GLYCOL 3350 17 G PO PACK
17.0000 g | PACK | Freq: Every day | ORAL | Status: DC | PRN
  Administered 2017-10-28: 17 g via ORAL
  Filled 2017-10-25: qty 1

## 2017-10-25 MED ORDER — TRANEXAMIC ACID 1000 MG/10ML IV SOLN
1000.0000 mg | Freq: Once | INTRAVENOUS | Status: AC
  Administered 2017-10-25: 1000 mg via INTRAVENOUS
  Filled 2017-10-25: qty 1100

## 2017-10-25 MED ORDER — SODIUM CHLORIDE 0.9 % IJ SOLN
INTRAMUSCULAR | Status: AC
  Filled 2017-10-25: qty 50

## 2017-10-25 MED ORDER — PANTOPRAZOLE SODIUM 20 MG PO TBEC
20.0000 mg | DELAYED_RELEASE_TABLET | Freq: Every day | ORAL | Status: DC
  Administered 2017-10-26 – 2017-10-28 (×3): 20 mg via ORAL
  Filled 2017-10-25 (×4): qty 1

## 2017-10-25 MED ORDER — DOCUSATE SODIUM 100 MG PO CAPS
100.0000 mg | ORAL_CAPSULE | Freq: Two times a day (BID) | ORAL | Status: DC
  Administered 2017-10-25 – 2017-10-28 (×6): 100 mg via ORAL
  Filled 2017-10-25 (×6): qty 1

## 2017-10-25 MED ORDER — STERILE WATER FOR IRRIGATION IR SOLN
Status: DC | PRN
  Administered 2017-10-25: 2000 mL

## 2017-10-25 MED ORDER — METHOCARBAMOL 1000 MG/10ML IJ SOLN
500.0000 mg | Freq: Four times a day (QID) | INTRAVENOUS | Status: DC | PRN
  Administered 2017-10-25: 500 mg via INTRAVENOUS
  Filled 2017-10-25: qty 550

## 2017-10-25 MED ORDER — HYDROMORPHONE HCL 1 MG/ML IJ SOLN
0.5000 mg | INTRAMUSCULAR | Status: DC | PRN
  Administered 2017-10-25: 0.5 mg via INTRAVENOUS
  Administered 2017-10-26 – 2017-10-27 (×3): 1 mg via INTRAVENOUS
  Filled 2017-10-25 (×4): qty 1

## 2017-10-25 MED ORDER — FLEET ENEMA 7-19 GM/118ML RE ENEM: 1.0000 | ENEMA | Freq: Once | RECTAL | Status: DC | PRN

## 2017-10-25 MED ORDER — CEFAZOLIN SODIUM-DEXTROSE 2-4 GM/100ML-% IV SOLN
2.0000 g | INTRAVENOUS | Status: AC
  Administered 2017-10-25: 2 g via INTRAVENOUS
  Filled 2017-10-25: qty 100

## 2017-10-25 MED ORDER — GABAPENTIN 300 MG PO CAPS
300.0000 mg | ORAL_CAPSULE | Freq: Three times a day (TID) | ORAL | Status: DC
  Administered 2017-10-25 – 2017-10-28 (×9): 300 mg via ORAL
  Filled 2017-10-25 (×9): qty 1

## 2017-10-25 MED ORDER — LORATADINE 10 MG PO TABS: 10.0000 mg | ORAL_TABLET | Freq: Every day | ORAL | Status: DC

## 2017-10-25 MED ORDER — LIP MEDEX EX OINT
TOPICAL_OINTMENT | CUTANEOUS | Status: AC
  Filled 2017-10-25: qty 7

## 2017-10-25 MED ORDER — PROPOFOL 10 MG/ML IV BOLUS
INTRAVENOUS | Status: AC
  Filled 2017-10-25: qty 40

## 2017-10-25 MED ORDER — ACETAMINOPHEN 500 MG PO TABS
1000.0000 mg | ORAL_TABLET | Freq: Four times a day (QID) | ORAL | Status: AC
  Administered 2017-10-25 – 2017-10-26 (×4): 1000 mg via ORAL
  Filled 2017-10-25 (×4): qty 2

## 2017-10-25 MED ORDER — ELETRIPTAN HYDROBROMIDE 40 MG PO TABS: 40.0000 mg | ORAL_TABLET | ORAL | Status: DC | PRN

## 2017-10-25 MED ORDER — ROPIVACAINE HCL 7.5 MG/ML IJ SOLN
INTRAMUSCULAR | Status: DC | PRN
  Administered 2017-10-25 (×6): 5 mL via PERINEURAL

## 2017-10-25 MED ORDER — FENTANYL CITRATE (PF) 100 MCG/2ML IJ SOLN
25.0000 ug | INTRAMUSCULAR | Status: DC | PRN
  Administered 2017-10-25 (×3): 50 ug via INTRAVENOUS

## 2017-10-25 MED ORDER — VENLAFAXINE HCL ER 150 MG PO CP24
150.0000 mg | ORAL_CAPSULE | Freq: Every day | ORAL | Status: DC
  Administered 2017-10-26 – 2017-10-28 (×3): 150 mg via ORAL
  Filled 2017-10-25 (×3): qty 1

## 2017-10-25 MED ORDER — OXYBUTYNIN CHLORIDE ER 5 MG PO TB24
10.0000 mg | ORAL_TABLET | Freq: Every day | ORAL | Status: DC
Start: 2017-10-25 — End: 2017-10-28
  Administered 2017-10-25 – 2017-10-27 (×3): 10 mg via ORAL
  Filled 2017-10-25 (×3): qty 2

## 2017-10-25 MED ORDER — ONDANSETRON HCL 4 MG PO TABS
4.0000 mg | ORAL_TABLET | Freq: Four times a day (QID) | ORAL | Status: DC | PRN
  Administered 2017-10-27 (×2): 4 mg via ORAL
  Filled 2017-10-25 (×2): qty 1

## 2017-10-25 MED ORDER — FENTANYL CITRATE (PF) 250 MCG/5ML IJ SOLN
INTRAMUSCULAR | Status: AC
  Filled 2017-10-25: qty 5

## 2017-10-25 MED ORDER — CHLORHEXIDINE GLUCONATE 4 % EX LIQD: 60.0000 mL | Freq: Once | CUTANEOUS | Status: DC

## 2017-10-25 MED ORDER — PROPOFOL 500 MG/50ML IV EMUL
INTRAVENOUS | Status: DC | PRN
  Administered 2017-10-25 (×3): 20 mg via INTRAVENOUS

## 2017-10-25 MED ORDER — METOCLOPRAMIDE HCL 5 MG PO TABS
5.0000 mg | ORAL_TABLET | Freq: Three times a day (TID) | ORAL | Status: DC | PRN
  Administered 2017-10-27: 10 mg via ORAL
  Filled 2017-10-25: qty 2

## 2017-10-25 MED ORDER — FENTANYL CITRATE (PF) 100 MCG/2ML IJ SOLN
INTRAMUSCULAR | Status: DC | PRN
  Administered 2017-10-25 (×5): 50 ug via INTRAVENOUS

## 2017-10-25 MED ORDER — FENTANYL CITRATE (PF) 100 MCG/2ML IJ SOLN
50.0000 ug | INTRAMUSCULAR | Status: DC | PRN
  Administered 2017-10-25: 100 ug via INTRAVENOUS

## 2017-10-25 MED ORDER — METOCLOPRAMIDE HCL 5 MG/ML IJ SOLN
5.0000 mg | Freq: Three times a day (TID) | INTRAMUSCULAR | Status: DC | PRN
  Administered 2017-10-25: 10 mg via INTRAVENOUS
  Filled 2017-10-25: qty 2

## 2017-10-25 MED ORDER — DEXAMETHASONE SODIUM PHOSPHATE 10 MG/ML IJ SOLN
10.0000 mg | Freq: Once | INTRAMUSCULAR | Status: AC
  Administered 2017-10-26: 10 mg via INTRAVENOUS
  Filled 2017-10-25: qty 1

## 2017-10-25 MED ORDER — BUPIVACAINE LIPOSOME 1.3 % IJ SUSP
INTRAMUSCULAR | Status: DC | PRN
  Administered 2017-10-25: 20 mL

## 2017-10-25 MED ORDER — KETOROLAC TROMETHAMINE 30 MG/ML IJ SOLN
INTRAMUSCULAR | Status: AC
  Filled 2017-10-25: qty 1

## 2017-10-25 MED ORDER — ONDANSETRON HCL 4 MG/2ML IJ SOLN
4.0000 mg | Freq: Four times a day (QID) | INTRAMUSCULAR | Status: DC | PRN
  Administered 2017-10-25 – 2017-10-26 (×2): 4 mg via INTRAVENOUS
  Filled 2017-10-25 (×2): qty 2

## 2017-10-25 MED ORDER — OXYCODONE HCL 5 MG PO TABS
5.0000 mg | ORAL_TABLET | ORAL | Status: DC | PRN
  Administered 2017-10-25 (×2): 5 mg via ORAL
  Administered 2017-10-26 – 2017-10-28 (×9): 10 mg via ORAL
  Filled 2017-10-25: qty 1
  Filled 2017-10-25 (×3): qty 2
  Filled 2017-10-25: qty 1
  Filled 2017-10-25 (×6): qty 2

## 2017-10-25 MED ORDER — FENTANYL CITRATE (PF) 100 MCG/2ML IJ SOLN
INTRAMUSCULAR | Status: AC
  Filled 2017-10-25: qty 2

## 2017-10-25 MED ORDER — MENTHOL 3 MG MT LOZG: 1.0000 | LOZENGE | OROMUCOSAL | Status: DC | PRN

## 2017-10-25 MED ORDER — BISACODYL 10 MG RE SUPP
10.0000 mg | Freq: Every day | RECTAL | Status: DC | PRN
Start: 2017-10-25 — End: 2017-10-28

## 2017-10-25 MED ORDER — DIPHENHYDRAMINE HCL 12.5 MG/5ML PO ELIX
12.5000 mg | ORAL_SOLUTION | ORAL | Status: DC | PRN
  Administered 2017-10-26: 25 mg via ORAL
  Filled 2017-10-25: qty 10

## 2017-10-25 MED ORDER — GABAPENTIN 300 MG PO CAPS
300.0000 mg | ORAL_CAPSULE | Freq: Once | ORAL | Status: AC
  Administered 2017-10-25: 300 mg via ORAL
  Filled 2017-10-25: qty 1

## 2017-10-25 MED ORDER — PROMETHAZINE HCL 25 MG/ML IJ SOLN: 6.2500 mg | INTRAMUSCULAR | Status: DC | PRN

## 2017-10-25 MED ORDER — CEFAZOLIN SODIUM-DEXTROSE 2-4 GM/100ML-% IV SOLN
2.0000 g | Freq: Four times a day (QID) | INTRAVENOUS | Status: AC
  Administered 2017-10-25 – 2017-10-26 (×2): 2 g via INTRAVENOUS
  Filled 2017-10-25 (×2): qty 100

## 2017-10-25 SURGICAL SUPPLY — 49 items
BAG DECANTER FOR FLEXI CONT (MISCELLANEOUS) ×1 IMPLANT
BAG SPEC THK2 15X12 ZIP CLS (MISCELLANEOUS) ×1
BAG ZIPLOCK 12X15 (MISCELLANEOUS) ×2 IMPLANT
BANDAGE ACE 6X5 VEL STRL LF (GAUZE/BANDAGES/DRESSINGS) ×2 IMPLANT
BLADE SAG 18X100X1.27 (BLADE) ×2 IMPLANT
BLADE SAW SGTL 11.0X1.19X90.0M (BLADE) ×2 IMPLANT
BOWL SMART MIX CTS (DISPOSABLE) ×2 IMPLANT
CAPT KNEE TOTAL 3 ATTUNE ×1 IMPLANT
CEMENT HV SMART SET (Cement) ×4 IMPLANT
COVER SURGICAL LIGHT HANDLE (MISCELLANEOUS) ×2 IMPLANT
CUFF TOURN SGL QUICK 34 (TOURNIQUET CUFF) ×2
CUFF TRNQT CYL 34X4X40X1 (TOURNIQUET CUFF) ×1 IMPLANT
DECANTER SPIKE VIAL GLASS SM (MISCELLANEOUS) ×4 IMPLANT
DRAPE TOP 10253 STERILE (DRAPES) IMPLANT
DRAPE U-SHAPE 47X51 STRL (DRAPES) ×2 IMPLANT
DRSG ADAPTIC 3X8 NADH LF (GAUZE/BANDAGES/DRESSINGS) ×2 IMPLANT
DRSG PAD ABDOMINAL 8X10 ST (GAUZE/BANDAGES/DRESSINGS) ×2 IMPLANT
DURAPREP 26ML APPLICATOR (WOUND CARE) ×2 IMPLANT
ELECT REM PT RETURN 15FT ADLT (MISCELLANEOUS) ×2 IMPLANT
EVACUATOR 1/8 PVC DRAIN (DRAIN) ×2 IMPLANT
GAUZE SPONGE 4X4 12PLY STRL (GAUZE/BANDAGES/DRESSINGS) ×2 IMPLANT
GLOVE BIO SURGEON STRL SZ7.5 (GLOVE) IMPLANT
GLOVE BIO SURGEON STRL SZ8 (GLOVE) ×3 IMPLANT
GLOVE BIOGEL PI IND STRL 6.5 (GLOVE) IMPLANT
GLOVE BIOGEL PI IND STRL 8 (GLOVE) ×1 IMPLANT
GLOVE BIOGEL PI INDICATOR 6.5 (GLOVE)
GLOVE BIOGEL PI INDICATOR 8 (GLOVE) ×2
GLOVE SURG SS PI 6.5 STRL IVOR (GLOVE) IMPLANT
GOWN STRL REUS W/TWL LRG LVL3 (GOWN DISPOSABLE) ×4 IMPLANT
GOWN STRL REUS W/TWL XL LVL3 (GOWN DISPOSABLE) ×1 IMPLANT
HANDPIECE INTERPULSE COAX TIP (DISPOSABLE) ×2
IMMOBILIZER KNEE 20 (SOFTGOODS) ×2
IMMOBILIZER KNEE 20 THIGH 36 (SOFTGOODS) ×1 IMPLANT
MANIFOLD NEPTUNE II (INSTRUMENTS) ×2 IMPLANT
PACK TOTAL KNEE CUSTOM (KITS) ×2 IMPLANT
PAD ABD 8X10 STRL (GAUZE/BANDAGES/DRESSINGS) ×1 IMPLANT
PADDING CAST COTTON 6X4 STRL (CAST SUPPLIES) ×4 IMPLANT
POSITIONER SURGICAL ARM (MISCELLANEOUS) ×2 IMPLANT
SET HNDPC FAN SPRY TIP SCT (DISPOSABLE) ×1 IMPLANT
STRIP CLOSURE SKIN 1/2X4 (GAUZE/BANDAGES/DRESSINGS) ×2 IMPLANT
SUT MNCRL AB 4-0 PS2 18 (SUTURE) ×2 IMPLANT
SUT STRATAFIX 0 PDS 27 VIOLET (SUTURE) ×2
SUT VIC AB 2-0 CT1 27 (SUTURE) ×6
SUT VIC AB 2-0 CT1 TAPERPNT 27 (SUTURE) ×3 IMPLANT
SUTURE STRATFX 0 PDS 27 VIOLET (SUTURE) ×1 IMPLANT
SYR 30ML LL (SYRINGE) ×4 IMPLANT
TRAY FOLEY CATH 14FRSI W/METER (CATHETERS) ×2 IMPLANT
WRAP KNEE MAXI GEL POST OP (GAUZE/BANDAGES/DRESSINGS) ×2 IMPLANT
YANKAUER SUCT BULB TIP 10FT TU (MISCELLANEOUS) ×2 IMPLANT

## 2017-10-25 NOTE — Anesthesia Preprocedure Evaluation (Addendum)
Anesthesia Evaluation  Patient identified by MRN, date of birth, ID band Patient awake    Reviewed: Allergy & Precautions, NPO status , Patient's Chart, lab work & pertinent test results  History of Anesthesia Complications (+) PONV  Airway Mallampati: I       Dental no notable dental hx. (+) Teeth Intact   Pulmonary    Pulmonary exam normal breath sounds clear to auscultation       Cardiovascular Normal cardiovascular exam Rhythm:Regular Rate:Normal     Neuro/Psych Depression    GI/Hepatic GERD  Medicated,  Endo/Other  diabetes, Gestational  Renal/GU      Musculoskeletal  (+) Arthritis , Osteoarthritis,    Abdominal Normal abdominal exam  (+)   Peds  Hematology   Anesthesia Other Findings   Reproductive/Obstetrics                             Anesthesia Physical Anesthesia Plan  ASA: II  Anesthesia Plan: General   Post-op Pain Management:  Regional for Post-op pain   Induction:   PONV Risk Score and Plan: 3 and Ondansetron, Dexamethasone, Scopolamine patch - Pre-op, Midazolam and Propofol infusion  Airway Management Planned: Natural Airway, Nasal Cannula, Simple Face Mask and LMA  Additional Equipment:   Intra-op Plan:   Post-operative Plan:   Informed Consent: I have reviewed the patients History and Physical, chart, labs and discussed the procedure including the risks, benefits and alternatives for the proposed anesthesia with the patient or authorized representative who has indicated his/her understanding and acceptance.     Plan Discussed with: CRNA and Surgeon  Anesthesia Plan Comments:        Anesthesia Quick Evaluation

## 2017-10-25 NOTE — Anesthesia Procedure Notes (Addendum)
Anesthesia Regional Block: Adductor canal block   Pre-Anesthetic Checklist: ,, timeout performed, Correct Patient, Correct Site, Correct Laterality, Correct Procedure, Correct Position, site marked, Risks and benefits discussed,  Surgical consent,  Pre-op evaluation,  At surgeon's request and post-op pain management  Laterality: Right and Lower  Prep: chloraprep       Needles:  Injection technique: Single-shot  Needle Type: Echogenic Stimulator Needle     Needle Length: 10cm  Needle Gauge: 21   Needle insertion depth: 3 cm   Additional Needles:   Procedures:,,,, ultrasound used (permanent image in chart),,,,  Narrative:  Start time: 10/25/2017 12:07 PM End time: 10/25/2017 12:17 PM Injection made incrementally with aspirations every 5 mL.  Performed by: Personally  Anesthesiologist: Lyn Hollingshead, MD

## 2017-10-25 NOTE — Transfer of Care (Signed)
Immediate Anesthesia Transfer of Care Note  Patient: [name]  Procedure(s) Performed: RIGHT TOTAL KNEE ARTHROPLASTY (Right Knee)  Patient Location: PACU  Anesthesia Type:General  Level of Consciousness: awake, alert  and oriented  Airway & Oxygen Therapy: Patient Spontanous Breathing and Patient connected to face mask oxygen  Post-op Assessment: Report given to RN and Post -op Vital signs reviewed and stable  Post vital signs: Reviewed and stable  Last Vitals:  Vitals:   10/25/17 1231 10/25/17 1232  BP:    Pulse: 78 72  Resp: 17   Temp:    SpO2: 100% 100%    Last Pain:  Vitals:   10/25/17 1225  TempSrc:   PainSc: 0-No pain         Complications: No apparent anesthesia complications

## 2017-10-25 NOTE — Anesthesia Postprocedure Evaluation (Signed)
Anesthesia Post Note  Patient: TAKIERA MAYO  Procedure(s) Performed: RIGHT TOTAL KNEE ARTHROPLASTY (Right Knee)     Patient location during evaluation: PACU Anesthesia Type: General Level of consciousness: sedated Pain management: pain level controlled Vital Signs Assessment: post-procedure vital signs reviewed and stable Respiratory status: spontaneous breathing Cardiovascular status: stable Postop Assessment: no apparent nausea or vomiting, no backache and no headache Anesthetic complications: no    Last Vitals:  Vitals:   10/25/17 1445 10/25/17 1500  BP: (!) 108/55 109/68  Pulse: 71 83  Resp: (!) 8 17  Temp:    SpO2: 93% 98%    Last Pain:  Vitals:   10/25/17 1505  TempSrc:   PainSc: Asleep   Pain Goal:    LLE Motor Response: Purposeful movement;Responds to commands (10/25/17 1500) LLE Sensation: Full sensation (10/25/17 1500) RLE Motor Response: Purposeful movement;Responds to commands (10/25/17 1500) RLE Sensation: Full sensation (10/25/17 1500) L Sensory Level: S1-Sole of foot, small toes (10/25/17 1500) R Sensory Level: S1-Sole of foot, small toes (10/25/17 1500)  Lakishia Bourassa JR,JOHN Yasmina [place]

## 2017-10-25 NOTE — Interval H&P Note (Signed)
History and Physical Interval Note:  10/25/2017 10:32 AM  [name]  has presented today for surgery, with the diagnosis of Right knee osteoarthritis  The various methods of treatment have been discussed with the patient and family. After consideration of risks, benefits and other options for treatment, the patient has consented to  Procedure(s): RIGHT TOTAL KNEE ARTHROPLASTY (Right) as a surgical intervention .  The patient's history has been reviewed, patient examined, no change in status, stable for surgery.  I have reviewed the patient's chart and labs.  Questions were answered to the patient's satisfaction.     Pilar Plate Vanisha Whiten

## 2017-10-25 NOTE — Discharge Instructions (Addendum)
Dr. Gaynelle Arabian Total Joint Specialist [facility] [address], [place], [place] [address] [phone]  TOTAL KNEE REPLACEMENT POSTOPERATIVE DIRECTIONS  Knee Rehabilitation, Guidelines Following Surgery  Results after knee surgery are often greatly improved when you follow the exercise, range of motion and muscle strengthening exercises prescribed by your doctor. Safety measures are also important to protect the knee from further injury. Any time any of these exercises cause you to have increased pain or swelling in your knee joint, decrease the amount until you are comfortable again and slowly increase them. If you have problems or questions, call your caregiver or physical therapist for advice.   HOME CARE INSTRUCTIONS  Remove items at home which could result in a fall. This includes throw rugs or furniture in walking pathways.   ICE to the affected knee every three hours for 30 minutes at a time and then as needed for pain and swelling.  Continue to use ice on the knee for pain and swelling from surgery. You may notice swelling that will progress down to the foot and ankle.  This is normal after surgery.  Elevate the leg when you are not up walking on it.    Continue to use the breathing machine which will help keep your temperature down.  It is common for your temperature to cycle up and down following surgery, especially at night when you are not up moving around and exerting yourself.  The breathing machine keeps your lungs expanded and your temperature down.  Do not place pillow under knee, focus on keeping the knee straight while resting  DIET You may resume your previous home diet once your are discharged from the hospital.  DRESSING / WOUND CARE / SHOWERING You may shower 3 days after surgery, but keep the wounds dry during showering.  You may use an occlusive plastic wrap (Press'n Seal for example), NO SOAKING/SUBMERGING IN THE BATHTUB.  If the bandage gets  wet, change with a clean dry gauze.  If the incision gets wet, pat the wound dry with a clean towel. You may start showering once you are discharged home but do not submerge the incision under water. Just pat the incision dry and apply a dry gauze dressing on daily. Change the surgical dressing daily and reapply a dry dressing each time.  ACTIVITY Walk with your walker as instructed. Use walker as long as suggested by your caregivers. Avoid periods of inactivity such as sitting longer than an hour when not asleep. This helps prevent blood clots.  You may resume a sexual relationship in one month or when given the OK by your doctor.  You may return to work once you are cleared by your doctor.  Do not drive a car for 6 weeks or until released by you surgeon.  Do not drive while taking narcotics.  WEIGHT BEARING Weight bearing as tolerated with assist device (walker, cane, etc) as directed, use it as long as suggested by your surgeon or therapist, typically at least 4-6 weeks.  POSTOPERATIVE CONSTIPATION PROTOCOL Constipation - defined medically as fewer than three stools per week and severe constipation as less than one stool per week.  One of the most common issues patients have following surgery is constipation.  Even if you have a regular bowel pattern at home, your normal regimen is likely to be disrupted due to multiple reasons following surgery.  Combination of anesthesia, postoperative narcotics, change in appetite and fluid intake all can affect your bowels.  In order to avoid complications following surgery, here are some recommendations in order to help you during your recovery period.  Colace (docusate) - Pick up an over-the-counter form of Colace or another stool softener and take twice a day as long as you are requiring postoperative pain medications.  Take with a full glass of water daily.  If you experience loose stools or diarrhea, hold the colace until you stool forms back up.  If  your symptoms do not get better within 1 week or if they get worse, check with your doctor.  Dulcolax (bisacodyl) - Pick up over-the-counter and take as directed by the product packaging as needed to assist with the movement of your bowels.  Take with a full glass of water.  Use this product as needed if not relieved by Colace only.   MiraLax (polyethylene glycol) - Pick up over-the-counter to have on hand.  MiraLax is a solution that will increase the amount of water in your bowels to assist with bowel movements.  Take as directed and can mix with a glass of water, juice, soda, coffee, or tea.  Take if you go more than two days without a movement. Do not use MiraLax more than once per day. Call your doctor if you are still constipated or irregular after using this medication for 7 days in a row.  If you continue to have problems with postoperative constipation, please contact the office for further assistance and recommendations.  If you experience "the worst abdominal pain ever" or develop nausea or vomiting, please contact the office immediatly for further recommendations for treatment.  ITCHING  If you experience itching with your medications, try taking only a single pain pill, or even half a pain pill at a time.  You can also use Benadryl over the counter for itching or also to help with sleep.   TED HOSE STOCKINGS Wear the elastic stockings on both legs for three weeks following surgery during the day but you may remove then at night for sleeping.  MEDICATIONS See your medication summary on the After Visit Summary that the nursing staff will review with you prior to discharge.  You may have some home medications which will be placed on hold until you complete the course of blood thinner medication.  It is important for you to complete the blood thinner medication as prescribed by your surgeon.  Continue your approved medications as instructed at time of discharge.  PRECAUTIONS If you  experience chest pain or shortness of breath - call 911 immediately for transfer to the hospital emergency department.  If you develop a fever greater that 101 F, purulent drainage from wound, increased redness or drainage from wound, foul odor from the wound/dressing, or calf pain - CONTACT YOUR SURGEON.                                                   FOLLOW-UP APPOINTMENTS Make sure you keep all of your appointments after your operation with your surgeon and caregivers. You should call the office at the above phone number and make an appointment for approximately two weeks after the date of your surgery or on the date instructed by your surgeon outlined in the "After Visit Summary".   RANGE OF MOTION AND STRENGTHENING EXERCISES  Rehabilitation of the knee is important following a knee injury or  an operation. After just a few days of immobilization, the muscles of the thigh which control the knee become weakened and shrink (atrophy). Knee exercises are designed to build up the tone and strength of the thigh muscles and to improve knee motion. Often times heat used for twenty to thirty minutes before working out will loosen up your tissues and help with improving the range of motion but do not use heat for the first two weeks following surgery. These exercises can be done on a training (exercise) mat, on the floor, on a table or on a bed. Use what ever works the best and is most comfortable for you Knee exercises include:  Leg Lifts - While your knee is still immobilized in a splint or cast, you can do straight leg raises. Lift the leg to 60 degrees, hold for 3 sec, and slowly lower the leg. Repeat 10-20 times 2-3 times daily. Perform this exercise against resistance later as your knee gets better.  Quad and Hamstring Sets - Tighten up the muscle on the front of the thigh (Quad) and hold for 5-10 sec. Repeat this 10-20 times hourly. Hamstring sets are done by pushing the foot backward against an object  and holding for 5-10 sec. Repeat as with quad sets.   Leg Slides: Lying on your back, slowly slide your foot toward your buttocks, bending your knee up off the floor (only go as far as is comfortable). Then slowly slide your foot back down until your leg is flat on the floor again.  Angel Wings: Lying on your back spread your legs to the side as far apart as you can without causing discomfort.  A rehabilitation program following serious knee injuries can speed recovery and prevent re-injury in the future due to weakened muscles. Contact your doctor or a physical therapist for more information on knee rehabilitation.   IF YOU ARE TRANSFERRED TO A SKILLED REHAB FACILITY If the patient is transferred to a skilled rehab facility following release from the hospital, a list of the current medications will be sent to the facility for the patient to continue.  When discharged from the skilled rehab facility, please have the facility set up the patient's [place] prior to being released. Also, the skilled facility will be responsible for providing the patient with their medications at time of release from the facility to include their pain medication, the muscle relaxants, and their blood thinner medication. If the patient is still at the rehab facility at time of the two week follow up appointment, the skilled rehab facility will also need to assist the patient in arranging follow up appointment in our office and any transportation needs.  MAKE SURE YOU:  Understand these instructions.  Get help right away if you are not doing well or get worse.    Pick up stool softner and laxative for home use following surgery while on pain medications. Do not submerge incision under water. Please use good hand washing techniques while changing dressing each day. May shower starting three days after surgery. Please use a clean towel to pat the incision dry following showers. Continue to use ice for  pain and swelling after surgery. Do not use any lotions or creams on the incision until instructed by your surgeon.  Take Xarelto for two and a half more weeks following discharge from the hospital, then discontinue Xarelto. Once the patient has completed the blood thinner regimen, then take a Baby 81 mg Aspirin daily for three  more weeks.    Information on my medicine - XARELTO (Rivaroxaban)  Why was Xarelto prescribed for you? Xarelto was prescribed for you to reduce the risk of blood clots forming after orthopedic surgery. The medical term for these abnormal blood clots is venous thromboembolism (VTE).  What do you need to know about xarelto ? Take your Xarelto ONCE DAILY at the same time every day. You may take it either with or without food.  If you have difficulty swallowing the tablet whole, you may crush it and mix in applesauce just prior to taking your dose.  Take Xarelto exactly as prescribed by your doctor and DO NOT stop taking Xarelto without talking to the doctor who prescribed the medication.  Stopping without other VTE prevention medication to take the place of Xarelto may increase your risk of developing a clot.  After discharge, you should have regular check-up appointments with your healthcare provider that is prescribing your Xarelto.    What do you do if you miss a dose? If you miss a dose, take it as soon as you remember on the same day then continue your regularly scheduled once daily regimen the next day. Do not take two doses of Xarelto on the same day.   Important Safety Information A possible side effect of Xarelto is bleeding. You should call your healthcare provider right away if you experience any of the following: ? Bleeding from an injury or your nose that does not stop. ? Unusual colored urine (red or dark brown) or unusual colored stools (red or black). ? Unusual bruising for unknown reasons. ? A serious fall or if you hit your head (even  if there is no bleeding).  Some medicines may interact with Xarelto and might increase your risk of bleeding while on Xarelto. To help avoid this, consult your healthcare provider or pharmacist prior to using any new prescription or non-prescription medications, including herbals, vitamins, non-steroidal anti-inflammatory drugs (NSAIDs) and supplements.  This website has more information on Xarelto: [email or web address]

## 2017-10-25 NOTE — Progress Notes (Signed)
Assisted Dr. Hatchett with right, ultrasound guided, adductor canal block. Side rails up, monitors on throughout procedure. See vital signs in flow sheet. Tolerated Procedure well.  

## 2017-10-25 NOTE — Op Note (Signed)
OPERATIVE REPORT-TOTAL KNEE ARTHROPLASTY   Pre-operative diagnosis- Osteoarthritis  Right knee(s)  Post-operative diagnosis- Osteoarthritis Right knee(s)  Procedure-  Right  Total Knee Arthroplasty (Depuy Attune)  Surgeon- Danielle Plover. Charissa Knowles, MD  Assistant- Arlee Muslim, PA-C   Anesthesia-  GA combined with regional for post-op pain  EBL-25 ml  Drains Hemovac  Tourniquet time-  Total Tourniquet Time Documented: Thigh (Right) - 31 minutes Total: Thigh (Right) - 31 minutes     Complications- None  Condition-PACU - hemodynamically stable.   Brief Clinical Note  [name] is a 57 y.o. year old female with end stage OA of her right knee with progressively worsening pain and dysfunction. She has constant pain, with activity and at rest and significant functional deficits with difficulties even with ADLs. She has had extensive non-op management including analgesics, injections of cortisone and viscosupplements, and home exercise program, but remains in significant pain with significant dysfunction.Radiographs show bone on bone arthritis medial and patellofemoral. She presents now for right Total Knee Arthroplasty.    Procedure in detail---   The patient is brought into the operating room and positioned supine on the operating table. After successful administration of  GA combined with regional for post-op pain,   a tourniquet is placed high on the  Right thigh(s) and the lower extremity is prepped and draped in the usual sterile fashion. Time out is performed by the operating team and then the  Right lower extremity is wrapped in Esmarch, knee flexed and the tourniquet inflated to 300 mmHg.       A midline incision is made with a ten blade through the subcutaneous tissue to the level of the extensor mechanism. A fresh blade is used to make a medial parapatellar arthrotomy. Soft tissue over the proximal medial tibia is subperiosteally elevated to the joint line with a knife and into  the semimembranosus bursa with a Cobb elevator. Soft tissue over the proximal lateral tibia is elevated with attention being paid to avoiding the patellar tendon on the tibial tubercle. The patella is everted, knee flexed 90 degrees and the ACL and PCL are removed. Findings are bone on bone medial and patellofemoral with large global osteophytes.        The drill is used to create a starting hole in the distal femur and the canal is thoroughly irrigated with sterile saline to remove the fatty contents. The 5 degree Right  valgus alignment guide is placed into the femoral canal and the distal femoral cutting block is pinned to remove 9 mm off the distal femur. Resection is made with an oscillating saw.      The tibia is subluxed forward and the menisci are removed. The extramedullary alignment guide is placed referencing proximally at the medial aspect of the tibial tubercle and distally along the second metatarsal axis and tibial crest. The block is pinned to remove 16m off the more deficient medial  side. Resection is made with an oscillating saw. Size 5is the most appropriate size for the tibia and the proximal tibia is prepared with the modular drill and keel punch for that size.      The femoral sizing guide is placed and size 5 is most appropriate. Rotation is marked off the epicondylar axis and confirmed by creating a rectangular flexion gap at 90 degrees. The size 5 cutting block is pinned in this rotation and the anterior, posterior and chamfer cuts are made with the oscillating saw. The intercondylar block is then placed and that  cut is made.      Trial size 5 tibial component, trial size 5 posterior stabilized femur and a 8  mm posterior stabilized rotating platform insert trial is placed. Full extension is achieved with excellent varus/valgus and anterior/posterior balance throughout full range of motion. The patella is everted and thickness measured to be 21  mm. Free hand resection is taken to 12  mm, a 35 template is placed, lug holes are drilled, trial patella is placed, and it tracks normally. Osteophytes are removed off the posterior femur with the trial in place. All trials are removed and the cut bone surfaces prepared with pulsatile lavage. Cement is mixed and once ready for implantation, the size 5 tibial implant, size  5 posterior stabilized femoral component, and the size 35 patella are cemented in place and the patella is held with the clamp. The trial insert is placed and the knee held in full extension. The Exparel (20 ml mixed with 60 ml saline) is injected into the extensor mechanism, posterior capsule, medial and lateral gutters and subcutaneous tissues.  All extruded cement is removed and once the cement is hard the permanent 8 mm posterior stabilized rotating platform insert is placed into the tibial tray.      The wound is copiously irrigated with saline solution and the extensor mechanism closed over a hemovac drain with #1 V-loc suture. The tourniquet is released for a total tourniquet time of 31  minutes. Flexion against gravity is 140 degrees and the patella tracks normally. Subcutaneous tissue is closed with 2.0 vicryl and subcuticular with running 4.0 Monocryl. The incision is cleaned and dried and steri-strips and a bulky sterile dressing are applied. The limb is placed into a knee immobilizer and the patient is awakened and transported to recovery in stable condition.      Please note that a surgical assistant was a medical necessity for this procedure in order to perform it in a safe and expeditious manner. Surgical assistant was necessary to retract the ligaments and vital neurovascular structures to prevent injury to them and also necessary for proper positioning of the limb to allow for anatomic placement of the prosthesis.   Danielle Plover Ramirez Fullbright, MD    10/25/2017, 1:59 PM

## 2017-10-25 NOTE — Anesthesia Procedure Notes (Signed)
Procedure Name: LMA Insertion Date/Time: 10/25/2017 12:54 PM Performed by: Glory Buff, CRNA Pre-anesthesia Checklist: Patient identified, Emergency Drugs available, Suction available and Patient being monitored Patient Re-evaluated:Patient Re-evaluated prior to induction Oxygen Delivery Method: Circle system utilized Preoxygenation: Pre-oxygenation with 100% oxygen Induction Type: IV induction LMA: LMA inserted LMA Size: 4.0 Number of attempts: 1 Placement Confirmation: positive ETCO2 Tube secured with: Tape Dental Injury: Teeth and Oropharynx as per pre-operative assessment

## 2017-10-26 ENCOUNTER — Encounter: Payer: Self-pay | Admitting: Family Medicine

## 2017-10-26 LAB — CBC
HCT: 32.7 % — ABNORMAL LOW (ref 36.0–46.0)
Hemoglobin: 10.6 g/dL — ABNORMAL LOW (ref 12.0–15.0)
MCH: 31.6 pg (ref 26.0–34.0)
MCHC: 32.4 g/dL (ref 30.0–36.0)
MCV: 97.6 fL (ref 78.0–100.0)
PLATELETS: 230 10*3/uL (ref 150–400)
RBC: 3.35 MIL/uL — ABNORMAL LOW (ref 3.87–5.11)
RDW: 14 % (ref 11.5–15.5)
WBC: 7.3 10*3/uL (ref 4.0–10.5)

## 2017-10-26 LAB — BASIC METABOLIC PANEL
Anion gap: 5 (ref 5–15)
BUN: 12 mg/dL (ref 6–20)
CALCIUM: 8.4 mg/dL — AB (ref 8.9–10.3)
CO2: 24 mmol/L (ref 22–32)
CREATININE: 1.02 mg/dL — AB (ref 0.44–1.00)
Chloride: 110 mmol/L (ref 101–111)
GFR calc Af Amer: >60 mL/min (ref >60)
GFR calc non Af Amer: 59 mL/min — ABNORMAL LOW (ref >60)
GLUCOSE: 111 mg/dL — AB (ref 65–99)
POTASSIUM: 3.5 mmol/L (ref 3.5–5.1)
SODIUM: 139 mmol/L (ref 135–145)

## 2017-10-26 MED ORDER — METHOCARBAMOL 500 MG PO TABS: 500.0000 mg | ORAL_TABLET | Freq: Four times a day (QID) | ORAL | 0 refills | Status: DC | PRN

## 2017-10-26 MED ORDER — RIVAROXABAN 10 MG PO TABS: 10.0000 mg | ORAL_TABLET | Freq: Every day | ORAL | 0 refills | Status: DC

## 2017-10-26 MED ORDER — GABAPENTIN 300 MG PO CAPS: 300.0000 mg | ORAL_CAPSULE | Freq: Three times a day (TID) | ORAL | 0 refills | Status: DC

## 2017-10-26 MED ORDER — OXYCODONE HCL 5 MG PO TABS: 5.0000 mg | ORAL_TABLET | ORAL | 0 refills | Status: DC | PRN

## 2017-10-26 MED ORDER — SODIUM CHLORIDE 0.9 % IV BOLUS (SEPSIS)
250.0000 mL | Freq: Once | INTRAVENOUS | Status: AC
  Administered 2017-10-26: 250 mL via INTRAVENOUS

## 2017-10-26 MED FILL — OXYBUTYNIN CL ER 10 MG TAB: 10 | 30 days supply | Qty: 30 | Fill #1

## 2017-10-26 NOTE — Discharge Summary (Signed)
Physician Discharge Summary   Patient ID: [name] MRN: [id number] DOB/AGE: 1960/10/21 57 y.o.  Admit date: 10/25/2017 Discharge date: 10/28/2017  Primary Diagnosis: Osteoarthritis Right knee(s)   Admission Diagnoses:  Past Medical History:  Diagnosis Date  . Arthritis    Right Shoulder, Feet, Fingers, Back, Neck  . Bleeding nose    History of   . Depression   . Frequent urination   . GERD (gastroesophageal reflux disease)   . Gestational diabetes   . Heart murmur    echo 10/20/2011  . 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 Hyperlipidemia    Minimal  . Menopausal syndrome   . Migraine without aura    Botox injection 10/21/2017  . OA (osteoarthritis) of knee   . Obese   . Plantar warts   . PMB (postmenopausal bleeding)   . Pneumonia    history of   . PONV (postoperative nausea and vomiting)   . Postop Acute blood loss anemia 11/05/2011  . Ulcerative colitis ([place])   . Urgency of urination   . Vertigo    Discharge Diagnoses:   Principal Problem:   OA (osteoarthritis) of knee  Estimated body mass index is 29.54 kg/m as calculated from the following:   Height as of this encounter: '5\' 6"'$  (1.676 m).   Weight as of this encounter: 83 kg (183 lb).  Procedure:  Procedure(s) (LRB): RIGHT TOTAL KNEE ARTHROPLASTY (Right)   Consults: None  HPI: [name] is a 57 y.o. year old female with end stage OA of her right knee with progressively worsening pain and dysfunction. She has constant pain, with activity and at rest and significant functional deficits with difficulties even with ADLs. She has had extensive non-op management including analgesics, injections of cortisone and viscosupplements, and home exercise program, but remains in significant pain with significant dysfunction.Radiographs show bone on bone arthritis medial and patellofemoral. She presents now for right Total Knee Arthroplasty.    Laboratory Data: Admission on 10/25/2017  Component Date Value Ref Range  Status  . WBC 10/26/2017 7.3  4.0 - 10.5 K/uL Final  . RBC 10/26/2017 3.35* 3.87 - 5.11 MIL/uL Final  . Hemoglobin 10/26/2017 10.6* 12.0 - 15.0 g/dL Final  . HCT 10/26/2017 32.7* 36.0 - 46.0 % Final  . MCV 10/26/2017 97.6  78.0 - 100.0 fL Final  . MCH 10/26/2017 31.6  26.0 - 34.0 pg Final  . MCHC 10/26/2017 32.4  30.0 - 36.0 g/dL Final  . RDW 10/26/2017 14.0  11.5 - 15.5 % Final  . Platelets 10/26/2017 230  150 - 400 K/uL Final   Performed at [facility], [place] [address] [place], [place] [address]  . Sodium 10/26/2017 139  135 - 145 mmol/L Final  . Potassium 10/26/2017 3.5  3.5 - 5.1 mmol/L Final  . Chloride 10/26/2017 110  101 - 111 mmol/L Final  . CO2 10/26/2017 24  22 - 32 mmol/L Final  . Glucose, Bld 10/26/2017 111* 65 - 99 mg/dL Final  . BUN 10/26/2017 12  6 - 20 mg/dL Final  . Creatinine, Ser 10/26/2017 1.02* 0.44 - 1.00 mg/dL Final  . Calcium 10/26/2017 8.4* 8.9 - 10.3 mg/dL Final  . GFR calc non Af Amer 10/26/2017 59* >60 mL/min Final  . GFR calc Af Amer 10/26/2017 >60  >60 mL/min Final   Comment: (NOTE) The eGFR has been calculated using the CKD EPI equation. This calculation has not been validated in all clinical situations. eGFR's persistently <60  mL/min signify possible Chronic Kidney Disease.   Georgiann Hahn gap 10/26/2017 5  5 - 15 Final   Performed at [facility], [place] [address] [place], [place] [address]  . WBC 10/27/2017 7.8  4.0 - 10.5 K/uL Final  . RBC 10/27/2017 3.43* 3.87 - 5.11 MIL/uL Final  . Hemoglobin 10/27/2017 10.8* 12.0 - 15.0 g/dL Final  . HCT 10/27/2017 33.2* 36.0 - 46.0 % Final  . MCV 10/27/2017 96.8  78.0 - 100.0 fL Final  . MCH 10/27/2017 31.5  26.0 - 34.0 pg Final  . MCHC 10/27/2017 32.5  30.0 - 36.0 g/dL Final  . RDW 10/27/2017 14.2  11.5 - 15.5 % Final  . Platelets 10/27/2017 231  150 - 400 K/uL Final   Performed at [facility], [place] [address] [place], [place] [address]  . Sodium  10/27/2017 140  135 - 145 mmol/L Final  . Potassium 10/27/2017 3.5  3.5 - 5.1 mmol/L Final  . Chloride 10/27/2017 108  101 - 111 mmol/L Final  . CO2 10/27/2017 25  22 - 32 mmol/L Final  . Glucose, Bld 10/27/2017 113* 65 - 99 mg/dL Final  . BUN 10/27/2017 10  6 - 20 mg/dL Final  . Creatinine, Ser 10/27/2017 0.94  0.44 - 1.00 mg/dL Final  . Calcium 10/27/2017 8.8* 8.9 - 10.3 mg/dL Final  . GFR calc non Af Amer 10/27/2017 >60  >60 mL/min Final  . GFR calc Af Amer 10/27/2017 >60  >60 mL/min Final   Comment: (NOTE) The eGFR has been calculated using the CKD EPI equation. This calculation has not been validated in all clinical situations. eGFR's persistently <60 mL/min signify possible Chronic Kidney Disease.   Georgiann Hahn gap 10/27/2017 7  5 - 15 Final   Performed at [facility], [place] [address], [place], [place] [address]  . WBC 10/28/2017 8.6  4.0 - 10.5 K/uL Final  . RBC 10/28/2017 3.56* 3.87 - 5.11 MIL/uL Final  . Hemoglobin 10/28/2017 11.0* 12.0 - 15.0 g/dL Final  . HCT 10/28/2017 33.8* 36.0 - 46.0 % Final  . MCV 10/28/2017 94.9  78.0 - 100.0 fL Final  . MCH 10/28/2017 30.9  26.0 - 34.0 pg Final  . MCHC 10/28/2017 32.5  30.0 - 36.0 g/dL Final  . RDW 10/28/2017 14.1  11.5 - 15.5 % Final  . Platelets 10/28/2017 222  150 - 400 K/uL Final   Performed at [facility], [place] [address], [place], [place] [address]  Hospital Outpatient Visit on 10/18/2017  Component Date Value Ref Range Status  . aPTT 10/18/2017 28  24 - 36 seconds Final   Performed at [facility], [place] [address] [place], [place] [address]  . WBC 10/18/2017 5.8  4.0 - 10.5 K/uL Final  . RBC 10/18/2017 4.18  3.87 - 5.11 MIL/uL Final  . Hemoglobin 10/18/2017 13.0  12.0 - 15.0 g/dL Final  . HCT 10/18/2017 38.5  36.0 - 46.0 % Final  . MCV 10/18/2017 92.1  78.0 - 100.0 fL Final  . MCH 10/18/2017 31.1  26.0 - 34.0 pg Final  . MCHC 10/18/2017 33.8  30.0 - 36.0 g/dL Final  .  RDW 10/18/2017 13.7  11.5 - 15.5 % Final  . Platelets 10/18/2017 250  150 - 400 K/uL Final   Performed at [facility], [place] [address] [place], [place] [address]  . Sodium 10/18/2017 139  135 - 145 mmol/L Final  . Potassium 10/18/2017 4.3  3.5 - 5.1 mmol/L Final  . Chloride 10/18/2017 109  101 - 111 mmol/L Final  . CO2 10/18/2017 21* 22 - 32 mmol/L Final  . Glucose, Bld 10/18/2017 95  65 - 99 mg/dL Final  . BUN 10/18/2017 13  6 - 20 mg/dL Final  . Creatinine, Ser 10/18/2017 1.00  0.44 - 1.00 mg/dL Final  . Calcium 10/18/2017 9.3  8.9 - 10.3 mg/dL Final  . Total Protein 10/18/2017 7.1  6.5 - 8.1 g/dL Final  . Albumin 10/18/2017 4.2  3.5 - 5.0 g/dL Final  . AST 10/18/2017 19  15 - 41 U/L Final  . ALT 10/18/2017 14  14 - 54 U/L Final  . Alkaline Phosphatase 10/18/2017 62  38 - 126 U/L Final  . Total Bilirubin 10/18/2017 0.8  0.3 - 1.2 mg/dL Final  . GFR calc non Af Amer 10/18/2017 >60  >60 mL/min Final  . GFR calc Af Amer 10/18/2017 >60  >60 mL/min Final   Comment: (NOTE) The eGFR has been calculated using the CKD EPI equation. This calculation has not been validated in all clinical situations. eGFR's persistently <60 mL/min signify possible Chronic Kidney Disease.   Georgiann Hahn gap 10/18/2017 9  5 - 15 Final   Performed at [facility], [place] [address], [place], [place] [address]  . Prothrombin Time 10/18/2017 12.9  11.4 - 15.2 seconds Final  . INR 10/18/2017 0.98   Final   Performed at [facility], [place] [address], [place], [place] [address]  . ABO/RH(D) 10/18/2017 AB NEG   Final  . Antibody Screen 10/18/2017 NEG   Final  . Sample Expiration 10/18/2017 10/28/2017   Final  . Extend sample reason 10/18/2017    Final                   Value:NO TRANSFUSIONS OR PREGNANCY IN THE PAST 3 MONTHS Performed at [facility], [place] [address] [place], [place] [address]   . MRSA, PCR 10/18/2017 NEGATIVE  NEGATIVE Final  .  Staphylococcus aureus 10/18/2017 NEGATIVE  NEGATIVE Final   Comment: (NOTE) The Xpert SA Assay (FDA approved for NASAL specimens in patients 37 years of age and older), is one component of a comprehensive surveillance program. It is not intended to diagnose infection nor to guide or monitor treatment. Performed at [facility], [place] [address] [place],  [address]   Office Visit on 09/08/2017  Component Date Value Ref Range Status  . WBC 09/08/2017 6.0  4.0 - 10.5 K/uL Final  . RBC 09/08/2017 4.37  3.87 - 5.11 Mil/uL Final  . Platelets 09/08/2017 319.0  150.0 - 400.0 K/uL Final  . Hemoglobin 09/08/2017 13.6  12.0 - 15.0 g/dL Final  . HCT 09/08/2017 41.9  36.0 - 46.0 % Final  . MCV 09/08/2017 95.8  78.0 - 100.0 fl Final  . MCHC 09/08/2017 32.5  30.0 - 36.0 g/dL Final  . RDW 09/08/2017 13.8  11.5 - 15.5 % Final  . Sodium 09/08/2017 140  135 - 145 mEq/L Final  . Potassium 09/08/2017 4.1  3.5 - 5.1 mEq/L Final  . Chloride 09/08/2017 105  96 - 112 mEq/L Final  . CO2 09/08/2017 26  19 - 32 mEq/L Final  . Glucose, Bld 09/08/2017 98  70 - 99 mg/dL Final  . BUN 09/08/2017 16  6 - 23 mg/dL Final  . Creatinine, Ser 09/08/2017 0.96  0.40 - 1.20 mg/dL Final  . Total Bilirubin 09/08/2017 0.4  0.2 - 1.2 mg/dL Final  . Alkaline Phosphatase 09/08/2017 60  39 - 117 U/L Final  . AST 09/08/2017 18  0 - 37 U/L Final  . ALT 09/08/2017 13  0 - 35 U/L Final  . Total Protein 09/08/2017 7.2  6.0 - 8.3 g/dL Final  . Albumin 09/08/2017 4.3  3.5 - 5.2 g/dL Final  . Calcium 09/08/2017 9.8  8.4 - 10.5 mg/dL Final  . GFR 09/08/2017 63.38  >60.00 mL/min Final  . Cholesterol 09/08/2017 201* 0 - 200 mg/dL Final   ATP III Classification       Desirable:  < 200 mg/dL               Borderline High:  200 - 239 mg/dL          High:  > = 240 mg/dL  . Triglycerides 09/08/2017 81.0  0.0 - 149.0 mg/dL Final   Normal:  <150 mg/dLBorderline High:  150 - 199 mg/dL  . HDL 09/08/2017 63.30  >39.00  mg/dL Final  . VLDL 09/08/2017 16.2  0.0 - 40.0 mg/dL Final  . LDL Cholesterol 09/08/2017 122* 0 - 99 mg/dL Final  . Total CHOL/HDL Ratio 09/08/2017 3   Final                  Men          Women1/2 Average Risk     3.4          3.3Average Risk          5.0          4.42X Average Risk          9.6          7.13X Average Risk          15.0          11.0                      . NonHDL 09/08/2017 137.94   Final   NOTE:  Non-HDL goal should be 30 mg/dL higher than patient's LDL goal (i.e. LDL goal of < 70 mg/dL, would have non-HDL goal of < 100 mg/dL)  . Hgb A1c MFr Bld 09/08/2017 5.7  4.6 - 6.5 % Final   Glycemic Control Guidelines for People with Diabetes:Non Diabetic:  <6%Goal of Therapy: <7%Additional Action Suggested:  >8%   . Hepatitis C Ab 09/08/2017 NON-REACTIVE  NON-REACTI Final  . SIGNAL TO CUT-OFF 09/08/2017 0.01  <1.00 Final  . Adequacy 09/08/2017 Satisfactory for evaluation  endocervical/transformation zone component PRESENT.   Final  . Diagnosis 09/08/2017 NEGATIVE FOR INTRAEPITHELIAL LESIONS OR MALIGNANCY.   Final  . HPV 09/08/2017 NOT DETECTED   Final   Normal Reference Range - NOT Detected  . Material Submitted 09/08/2017 CervicoVaginal Pap [ThinPrep Imaged]   Final  . CYTOLOGY - PAP 09/08/2017 PAP RESULT   Final-Edited     X-Rays:No results found.  EKG: Orders placed or performed in visit on 09/08/17  . EKG 12-Lead     Hospital Course: [name] is a 57 y.o. who was admitted to [facility]. They were brought to the operating room on 10/25/2017 and underwent Procedure(s): RIGHT TOTAL KNEE ARTHROPLASTY.  Patient tolerated the procedure well and was later transferred to the recovery room and then to the orthopaedic floor for postoperative care.  They were given PO and IV analgesics for pain control following their surgery.  They were given 24 hours of  postoperative antibiotics of  Anti-infectives (From admission, onward)   Start     Dose/Rate Route Frequency Ordered  Stop   10/25/17 1800  ceFAZolin (ANCEF) IVPB 2g/100 mL premix     2 g 200 mL/hr over 30 Minutes Intravenous Every 6 hours 10/25/17 1540 10/26/17 0033   10/25/17 1046  ceFAZolin (ANCEF) IVPB 2g/100 mL premix     2 g 200 mL/hr over 30 Minutes Intravenous On call to O.R. 10/25/17 1046 10/25/17 1326     and started on DVT prophylaxis in the form of Xarelto.   PT and OT were ordered for total joint protocol.  Discharge planning consulted to help with postop disposition and equipment needs.  Patient had a good night on the evening of surgery.  They started to get up OOB with therapy on day one. She was doing better this time versus her previous surgery.   Hemovac drain was pulled without difficulty. She did get some nausea that evening.  Continued to work with therapy into day two.  Dressing was changed on day two and the incision was healing well.  She still had some nausea.  By day three, the patient had progressed with therapy and meeting their goals.  Incision was healing well. She was improving. Patient was seen in rounds and was ready to go home.  Up with therapy Diet - Diabetic diet Follow up - in 2 weeks Activity - WBAT Disposition - Home Condition Upon Discharge - Stable D/C Meds - See DC Summary DVT Prophylaxis - Xarelto     Discharge Instructions    Call MD / Call 911   Complete by:  As directed    If you experience chest pain or shortness of breath, CALL 911 and be transported to the hospital emergency room.  If you develope a fever above 101 F, pus (white drainage) or increased drainage or redness at the wound, or calf pain, call your surgeon's office.   Change dressing   Complete by:  As directed    Change dressing daily with sterile 4 x 4 inch gauze dressing and apply TED hose. Do not submerge the incision under water.   Constipation Prevention   Complete by:  As directed    Drink plenty of fluids.  Prune juice may be helpful.  You may use a stool softener, such as Colace  (over the counter) 100 mg twice a day.  Use MiraLax (over the counter) for constipation as needed.   Diet - low sodium heart healthy   Complete by:  As directed    Discharge instructions   Complete by:  As directed    Take Xarelto for two and a half more weeks, then discontinue Xarelto. Once the patient has completed the blood thinner regimen, then take a Baby 81 mg Aspirin daily for three more weeks.   Pick up stool softner and laxative for home use following surgery while on pain medications. Do not submerge incision under water. Please use good hand washing techniques while changing dressing each day. May shower starting three days after surgery. Please use a clean towel to pat the incision dry following showers. Continue to use ice for pain and swelling after surgery. Do not use any lotions or creams on the incision until instructed by your surgeon.  Wear both TED hose on both legs during the day every day for three weeks, but may remove the TED hose at night at home.  Postoperative Constipation Protocol  Constipation - defined medically as fewer  than three stools per week and severe constipation as less than one stool per week.  One of the most common issues patients have following surgery is constipation.  Even if you have a regular bowel pattern at home, your normal regimen is likely to be disrupted due to multiple reasons following surgery.  Combination of anesthesia, postoperative narcotics, change in appetite and fluid intake all can affect your bowels.  In order to avoid complications following surgery, here are some recommendations in order to help you during your recovery period.  Colace (docusate) - Pick up an over-the-counter form of Colace or another stool softener and take twice a day as long as you are requiring postoperative pain medications.  Take with a full glass of water daily.  If you experience loose stools or diarrhea, hold the colace until you stool forms back up.   If your symptoms do not get better within 1 week or if they get worse, check with your doctor.  Dulcolax (bisacodyl) - Pick up over-the-counter and take as directed by the product packaging as needed to assist with the movement of your bowels.  Take with a full glass of water.  Use this product as needed if not relieved by Colace only.   MiraLax (polyethylene glycol) - Pick up over-the-counter to have on hand.  MiraLax is a solution that will increase the amount of water in your bowels to assist with bowel movements.  Take as directed and can mix with a glass of water, juice, soda, coffee, or tea.  Take if you go more than two days without a movement. Do not use MiraLax more than once per day. Call your doctor if you are still constipated or irregular after using this medication for 7 days in a row.  If you continue to have problems with postoperative constipation, please contact the office for further assistance and recommendations.  If you experience "the worst abdominal pain ever" or develop nausea or vomiting, please contact the office immediatly for further recommendations for treatment.   Do not put a pillow under the knee. Place it under the heel.   Complete by:  As directed    Do not sit on low chairs, stoools or toilet seats, as it may be difficult to get up from low surfaces   Complete by:  As directed    Driving restrictions   Complete by:  As directed    No driving until released by the physician.   Increase activity slowly as tolerated   Complete by:  As directed    Lifting restrictions   Complete by:  As directed    No lifting until released by the physician.   Patient may shower   Complete by:  As directed    You may shower without a dressing once there is no drainage.  Do not wash over the wound.  If drainage remains, do not shower until drainage stops.   TED hose   Complete by:  As directed    Use stockings (TED hose) for 3 weeks on both leg(s).  You may remove them at night  for sleeping.   Weight bearing as tolerated   Complete by:  As directed    Laterality:  right   Extremity:  Lower     Allergies as of 10/28/2017      Reactions   Codeine Nausea And Vomiting   Onion Other (See Comments)   Headache      Medication List    STOP taking these  medications   BOTOX IJ   Fish Oil 1200 MG Caps   lidocaine 4 % external solution Commonly known as:  XYLOCAINE   multivitamin with minerals Tabs tablet   naproxen sodium 220 MG tablet Commonly known as:  ALEVE   Turmeric 500 MG Tabs     TAKE these medications   cetirizine 10 MG tablet Commonly known as:  ZYRTEC Take 10 mg by mouth daily.   diphenhydramine-acetaminophen 25-500 MG Tabs tablet Commonly known as:  TYLENOL PM Take 1-2 tablets by mouth at bedtime.   eletriptan 40 MG tablet Commonly known as:  RELPAX Take 40 mg by mouth as needed for migraine or headache. May repeat in 2 hours if headache persists or recurs.   fexofenadine 180 MG tablet Commonly known as:  ALLEGRA Take 180 mg by mouth daily.   gabapentin 300 MG capsule Commonly known as:  NEURONTIN Take 1 capsule (300 mg total) by mouth 3 (three) times daily. Gabapentin 300 mg Protocol Take a 300 mg capsule three times a day for two weeks, Then a 300 mg capsule twice a day for two weeks, Then a 300 mg capsule once a day for two weeks, then discontinue the Gabapentin.   HYDROcodone-acetaminophen 5-325 MG tablet Commonly known as:  NORCO/VICODIN Take 1-2 tablets by mouth every 4 (four) hours as needed for moderate pain.   Magnesium 400 MG Tabs Take 400 mg by mouth daily.   methocarbamol 500 MG tablet Commonly known as:  ROBAXIN Take 1 tablet (500 mg total) by mouth every 6 (six) hours as needed for muscle spasms.   omeprazole 20 MG tablet Commonly known as:  PRILOSEC OTC Take 20 mg by mouth daily.   ondansetron 4 MG tablet Commonly known as:  ZOFRAN Take 1 tablet (4 mg total) by mouth every 6 (six) hours as needed for  nausea.   oxybutynin 10 MG 24 hr tablet Commonly known as:  DITROPAN-XL Take 1 tablet (10 mg total) by mouth at bedtime.   promethazine 12.5 MG tablet Commonly known as:  PHENERGAN Take 1-2 tablets (12.5-25 mg total) by mouth every 6 (six) hours as needed for nausea or vomiting.   rivaroxaban 10 MG Tabs tablet Commonly known as:  XARELTO Take 1 tablet (10 mg total) by mouth daily with breakfast. Take Xarelto for two and a half more weeks following discharge from the hospital, then discontinue Xarelto. Once the patient has completed the blood thinner regimen, then take a Baby 81 mg Aspirin daily for three more weeks.   topiramate 200 MG tablet Commonly known as:  TOPAMAX Take 400 mg by mouth at bedtime.   venlafaxine XR 75 MG 24 hr capsule Commonly known as:  EFFEXOR-XR Take 150 mg by mouth daily after breakfast.            Discharge Care Instructions  (From admission, onward)        Start     Ordered   10/26/17 0000  Weight bearing as tolerated    Question Answer Comment  Laterality right   Extremity Lower      10/26/17 2209   10/26/17 0000  Change dressing    Comments:  Change dressing daily with sterile 4 x 4 inch gauze dressing and apply TED hose. Do not submerge the incision under water.   10/26/17 2209     Follow-up Information    [facility] Follow up.   Specialty:  Home Health Services Why:  physical therapy Contact information: [place]  [address] [place] [address] [phone]        Gaynelle Arabian, MD. Schedule an appointment as soon as possible for a visit on 11/09/2017.   Specialty:  Orthopedic Surgery Contact information: [address] [place] [address] [phone]           Signed: Arlee Muslim, PA-C Orthopaedic Surgery 10/28/2017, 9:05 AM

## 2017-10-26 NOTE — Plan of Care (Signed)
Reviewed plan of care with pt, she was attentive and verbalized understanding of all education.

## 2017-10-26 NOTE — Progress Notes (Signed)
Discharge planning, spoke with patient at beside. Plans to discharge to her mothers home, [address], [place], [place] [address].  Scheryl Darter for [facility] services, PT to eval and treat. Contacted [facility] for referral. Has RW and 3-n-1. [id number]

## 2017-10-26 NOTE — Progress Notes (Signed)
   Subjective: 1 Day Post-Op Procedure(s) (LRB): RIGHT TOTAL KNEE ARTHROPLASTY (Right) Patient reports pain as mild.   Patient seen in rounds for Dr. Wynelle Link.  She is doing [facility] better than her last knee surgery. She has had some soft pressures - give additional fluids this morning. Patient is well, and has had no acute complaints or problems We will start therapy today.  Plan is to go Home after hospital stay.  Objective: Vital signs in last 24 hours: Temp:  [97.4 F (36.3 C)-98.8 F (37.1 C)] 98.3 F (36.8 C) (03/05 0559) Pulse Rate:  [71-93] 75 (03/05 0559) Resp:  [8-25] 16 (03/05 0559) BP: (92-140)/(50-113) 106/52 (03/05 0559) SpO2:  [93 %-100 %] 99 % (03/05 0559) Weight:  [83 kg (183 lb)] 83 kg (183 lb) (03/04 1530)  Intake/Output from previous day:  Intake/Output Summary (Last 24 hours) at 10/26/2017 0838 Last data filed at 10/26/2017 0600 Gross per 24 hour  Intake 3420 ml  Output 1745 ml  Net 1675 ml    Intake/Output this shift: No intake/output data recorded.  Labs: Recent Labs    10/26/17 0720  HGB 10.6*   Recent Labs    10/26/17 0720  WBC 7.3  RBC 3.35*  HCT 32.7*  PLT 230   Recent Labs    10/26/17 0720  NA 139  K 3.5  CL 110  CO2 24  BUN 12  CREATININE 1.02*  GLUCOSE 111*  CALCIUM 8.4*   No results for input(s): LABPT, INR in the last 72 hours.  EXAM General - Patient is Alert, Appropriate and Oriented Extremity - Neurovascular intact Sensation intact distally Intact pulses distally Dorsiflexion/Plantar flexion intact Dressing - dressing C/D/I Motor Function - intact, moving foot and toes well on exam.  Hemovac pulled without difficulty.  Past Medical History:  Diagnosis Date  . Arthritis    Right Shoulder, Feet, Fingers, Back, Neck  . Bleeding nose    History of   . Depression   . Frequent urination   . GERD (gastroesophageal reflux disease)   . Gestational diabetes   . Heart murmur    echo 10/20/2011  . Hole in the ear  drum, right   . Hyperlipidemia    Minimal  . Menopausal syndrome   . Migraine without aura    Botox injection 10/21/2017  . OA (osteoarthritis) of knee   . Obese   . Plantar warts   . PMB (postmenopausal bleeding)   . Pneumonia    history of   . PONV (postoperative nausea and vomiting)   . Postop Acute blood loss anemia 11/05/2011  . Ulcerative colitis ([place])   . Urgency of urination   . Vertigo     Assessment/Plan: 1 Day Post-Op Procedure(s) (LRB): RIGHT TOTAL KNEE ARTHROPLASTY (Right) Principal Problem:   OA (osteoarthritis) of knee  Estimated body mass index is 29.54 kg/m as calculated from the following:   Height as of this encounter: 5\' 6"  (1.676 m).   Weight as of this encounter: 83 kg (183 lb). Advance diet Up with therapy Plan for discharge tomorrow Discharge home with home health - She wants to look into [facility] for [facility] Therapy HGB - 10.6  DVT Prophylaxis - Xarelto Weight-Bearing as tolerated to right leg D/C O2 and Pulse OX and try on Room Air  Arlee Muslim, PA-C Orthopaedic Surgery 10/26/2017, 8:38 AM

## 2017-10-26 NOTE — Evaluation (Signed)
Physical Therapy Evaluation Patient Details Name: [name] MRN: [id number] DOB: 28-Oct-1960 Today's Date: 10/26/2017   History of Present Illness  Pt is a 57 year old female s/p R TKA, with hx of L TKA  Clinical Impression  Pt is s/p TKA resulting in the deficits listed below (see PT Problem List).  Pt will benefit from skilled PT to increase their independence and safety with mobility to allow discharge to the venue listed below.  Pt reports better experience with R TKA vs previous L TKA.  Pt performed LE exercises and educated on KI use.  Pt also assisted with ambulating in hallway as tolerated.  Pt plans to d/c to her mother's home and receive HHPT upon d/c.    Follow Up Recommendations Home health PT    Equipment Recommendations  None recommended by PT    Recommendations for Other Services       Precautions / Restrictions Precautions Precautions: Knee Required Braces or Orthoses: Knee Immobilizer - Right Knee Immobilizer - Right: Discontinue once straight leg raise with < 10 degree lag Restrictions Weight Bearing Restrictions: No Other Position/Activity Restrictions: WBAT      Mobility  Bed Mobility               General bed mobility comments: pt up in recliner on arrival  Transfers Overall transfer level: Needs assistance Equipment used: Rolling walker (2 wheeled) Transfers: Sit to/from Stand Sit to Stand: Min assist         General transfer comment: assist to rise and steady, verbal cues for UE and LE positioning  Ambulation/Gait Ambulation/Gait assistance: Min guard Ambulation Distance (Feet): 80 Feet Assistive device: Rolling walker (2 wheeled) Gait Pattern/deviations: Step-to pattern;Decreased stance time - right;Trunk flexed     General Gait Details: verbal cues for sequence, RW positioning, step length, posture  Stairs            Wheelchair Mobility    Modified Rankin (Stroke Patients Only)       Balance                                              Pertinent Vitals/Pain Pain Assessment: 0-10 Pain Score: 7  Pain Location: R knee Pain Descriptors / Indicators: Sore;Aching Pain Intervention(s): Limited activity within patient's tolerance;Monitored during session;Premedicated before session;Repositioned;Ice applied    Home Living Family/patient expects to be discharged to:: Private residence Living Arrangements: Alone Available Help at Discharge: Family(mother) Type of Home: House Home Access: Ramped entrance     Home Layout: Able to live on main level with bedroom/bathroom Home Equipment: Gilford Rile - 2 wheels Additional Comments: plans to d/c to mother's home as above    Prior Function Level of Independence: Independent         Comments: works at [employer]        Extremity/Trunk Assessment        Lower Extremity Assessment Lower Extremity Assessment: RLE deficits/detail RLE Deficits / Details: R knee AAROM -7-50* limited by pain, unable to perform SLR       Communication   Communication: No difficulties  Cognition Arousal/Alertness: Awake/alert Behavior During Therapy: WFL for tasks assessed/performed Overall Cognitive Status: Within Functional Limits for tasks assessed  General Comments      Exercises Total Joint Exercises Ankle Circles/Pumps: AROM;Both;10 reps Quad Sets: AROM;10 reps;Both Short Arc Quad: AAROM;10 reps;Right Heel Slides: AAROM;10 reps;Right Hip ABduction/ADduction: AAROM;10 reps;Right Straight Leg Raises: AAROM;10 reps;Right   Assessment/Plan    PT Assessment Patient needs continued PT services  PT Problem List Decreased strength;Decreased mobility;Decreased range of motion;Decreased activity tolerance;Decreased knowledge of use of DME;Pain;Decreased knowledge of precautions       PT Treatment Interventions Stair training;Balance training;Gait training;Therapeutic  exercise;Functional mobility training;Patient/family education;DME instruction;Therapeutic activities    PT Goals (Current goals can be found in the Care Plan section)  Acute Rehab PT Goals PT Goal Formulation: With patient Time For Goal Achievement: 10/30/17 Potential to Achieve Goals: Good    Frequency 7X/week   Barriers to discharge        Co-evaluation               AM-PAC PT "6 Clicks" Daily Activity  Outcome Measure Difficulty turning over in bed (including adjusting bedclothes, sheets and blankets)?: A Lot Difficulty moving from lying on back to sitting on the side of the bed? : A Lot Difficulty sitting down on and standing up from a chair with arms (e.g., wheelchair, bedside commode, etc,.)?: Unable Help needed moving to and from a bed to chair (including a wheelchair)?: A Little Help needed walking in hospital room?: A Little Help needed climbing 3-5 steps with a railing? : A Lot 6 Click Score: 13    End of Session Equipment Utilized During Treatment: Gait belt;Right knee immobilizer Activity Tolerance: Patient limited by pain Patient left: in chair;with call bell/phone within reach Nurse Communication: Mobility status;Patient requests pain meds PT Visit Diagnosis: Other abnormalities of gait and mobility (R26.89)    Time: 2409-7353 PT Time Calculation (min) (ACUTE ONLY): 29 min   Charges:   PT Evaluation $PT Eval Low Complexity: 1 Low PT Treatments $Therapeutic Exercise: 8-22 mins   PT G Codes:       Carmelia Bake, PT, DPT 10/26/2017 Pager: [phone]  [name] 10/26/2017, 12:12 PM

## 2017-10-26 NOTE — Consult Note (Signed)
   [facility] CM Inpatient Consult   10/26/2017  IVELIZ GARAY Feb 19, 1961 [id number]   Spoke with Ms. Lake Bells at bedside on behalf of [place] Management program for [facility] [facility] employees/dependents with [facility] insurance.   Discussed that she will receive post hospital discharge call.   Provided 24-hr nurse advice line magnet.    Marthenia Rolling, MSN-Ed, RN,BSN [facility] Liaison [phone]

## 2017-10-26 NOTE — Progress Notes (Signed)
Physical Therapy Treatment Patient Details Name: [name] MRN: [id number] DOB: 21-May-1961 Today's Date: 10/26/2017    History of Present Illness Pt is a 57 year old female s/p R TKA, with hx of L TKA    PT Comments    Pt ambulated again in hallway and reports pain improved this afternoon.   Follow Up Recommendations  Home health PT;Follow surgeon's recommendation for DC plan and follow-up therapies     Equipment Recommendations  None recommended by PT    Recommendations for Other Services       Precautions / Restrictions Precautions Precautions: Knee Required Braces or Orthoses: Knee Immobilizer - Right Knee Immobilizer - Right: Discontinue once straight leg raise with < 10 degree lag Restrictions Other Position/Activity Restrictions: WBAT    Mobility  Bed Mobility Overal bed mobility: Needs Assistance Bed Mobility: Sit to Supine       Sit to supine: Supervision   General bed mobility comments: verbal cues for technique, increased time and effort  Transfers Overall transfer level: Needs assistance Equipment used: Rolling walker (2 wheeled) Transfers: Sit to/from Stand Sit to Stand: Min guard         General transfer comment: verbal cues for UE and LE positioning  Ambulation/Gait Ambulation/Gait assistance: Min guard Ambulation Distance (Feet): 240 Feet Assistive device: Rolling walker (2 wheeled) Gait Pattern/deviations: Step-to pattern;Decreased stance time - right     General Gait Details: verbal cues for sequence, RW positioning, step length, posture   Stairs            Wheelchair Mobility    Modified Rankin (Stroke Patients Only)       Balance                                            Cognition Arousal/Alertness: Awake/alert Behavior During Therapy: WFL for tasks assessed/performed Overall Cognitive Status: Within Functional Limits for tasks assessed                                        Exercises     General Comments        Pertinent Vitals/Pain Pain Assessment: 0-10 Pain Score: 3  Pain Location: R knee Pain Descriptors / Indicators: Sore;Aching Pain Intervention(s): Limited activity within patient's tolerance;Repositioned;Monitored during session    Home Living                      Prior Function            PT Goals (current goals can now be found in the care plan section) Progress towards PT goals: Progressing toward goals    Frequency    7X/week      PT Plan Current plan remains appropriate    Co-evaluation              AM-PAC PT "6 Clicks" Daily Activity  Outcome Measure  Difficulty turning over in bed (including adjusting bedclothes, sheets and blankets)?: A Lot Difficulty moving from lying on back to sitting on the side of the bed? : A Lot Difficulty sitting down on and standing up from a chair with arms (e.g., wheelchair, bedside commode, etc,.)?: A Lot Help needed moving to and from a bed to chair (including a wheelchair)?: A Little Help needed walking in hospital  room?: A Little Help needed climbing 3-5 steps with a railing? : A Little 6 Click Score: 15    End of Session Equipment Utilized During Treatment: Right knee immobilizer Activity Tolerance: Patient tolerated treatment well Patient left: in chair;in bed   PT Visit Diagnosis: Other abnormalities of gait and mobility (R26.89)     Time: 8182-9937 PT Time Calculation (min) (ACUTE ONLY): 11 min  Charges:  $Gait Training: 8-22 mins                    G Codes:      Carmelia Bake, PT, DPT 10/26/2017 Pager: [phone]   [name] 10/26/2017, 4:03 PM

## 2017-10-27 ENCOUNTER — Telehealth: Payer: Self-pay | Admitting: Family Medicine

## 2017-10-27 LAB — BASIC METABOLIC PANEL
Anion gap: 7 (ref 5–15)
BUN: 10 mg/dL (ref 6–20)
CALCIUM: 8.8 mg/dL — AB (ref 8.9–10.3)
CO2: 25 mmol/L (ref 22–32)
Chloride: 108 mmol/L (ref 101–111)
Creatinine, Ser: 0.94 mg/dL (ref 0.44–1.00)
GFR calc Af Amer: >60 mL/min (ref >60)
GLUCOSE: 113 mg/dL — AB (ref 65–99)
POTASSIUM: 3.5 mmol/L (ref 3.5–5.1)
SODIUM: 140 mmol/L (ref 135–145)

## 2017-10-27 LAB — CBC
HCT: 33.2 % — ABNORMAL LOW (ref 36.0–46.0)
Hemoglobin: 10.8 g/dL — ABNORMAL LOW (ref 12.0–15.0)
MCH: 31.5 pg (ref 26.0–34.0)
MCHC: 32.5 g/dL (ref 30.0–36.0)
MCV: 96.8 fL (ref 78.0–100.0)
PLATELETS: 231 10*3/uL (ref 150–400)
RBC: 3.43 MIL/uL — AB (ref 3.87–5.11)
RDW: 14.2 % (ref 11.5–15.5)
WBC: 7.8 10*3/uL (ref 4.0–10.5)

## 2017-10-27 MED ORDER — ONDANSETRON HCL 4 MG PO TABS
4.0000 mg | ORAL_TABLET | Freq: Four times a day (QID) | ORAL | 0 refills | Status: DC | PRN
Start: 2017-10-27 — End: 2018-06-08

## 2017-10-27 MED ORDER — PROMETHAZINE HCL 25 MG/ML IJ SOLN: 6.2500 mg | Freq: Four times a day (QID) | INTRAMUSCULAR | Status: DC | PRN

## 2017-10-27 MED ORDER — PROMETHAZINE HCL 25 MG PO TABS
12.5000 mg | ORAL_TABLET | Freq: Four times a day (QID) | ORAL | Status: DC | PRN
  Administered 2017-10-27 – 2017-10-28 (×2): 12.5 mg via ORAL
  Filled 2017-10-27 (×2): qty 1

## 2017-10-27 NOTE — Telephone Encounter (Signed)
Copied from [place] [id number]. Topic: General - Other >> Oct 27, 2017  3:49 PM Margot Ables wrote: Reason for CRM: Pt has total knee repl R 10/25/17 - will Dr. Lorelei Pont sign orders for [facility] and PT? Please advise.

## 2017-10-27 NOTE — Progress Notes (Signed)
Patient continues to have pain and nausea, doesn't feel ready for discharge nervous, anxious at this time. Azucena Fallen notified D [employer]

## 2017-10-27 NOTE — Progress Notes (Signed)
   Subjective: 2 Days Post-Op Procedure(s) (LRB): RIGHT TOTAL KNEE ARTHROPLASTY (Right) Patient reports pain as mild and also has some intermittent nausea from last night. Patient seen in rounds by Dr. Wynelle Link. Patient is well, but has had some minor complaints of nausea and pain in the knee, requiring pain medications Patient is ready to go home as long as she does well today.  Objective: Vital signs in last 24 hours: Temp:  [98 F (36.7 C)-98.8 F (37.1 C)] 98 F (36.7 C) (03/06 0652) Pulse Rate:  [81-96] 92 (03/06 0652) Resp:  [15-18] 15 (03/06 0652) BP: (107-131)/(61-77) 127/61 (03/06 0652) SpO2:  [95 %-99 %] 95 % (03/06 0652)  Intake/Output from previous day:  Intake/Output Summary (Last 24 hours) at 10/27/2017 0726 Last data filed at 10/27/2017 0600 Gross per 24 hour  Intake 1583.75 ml  Output 1150 ml  Net 433.75 ml    Intake/Output this shift: No intake/output data recorded.  Labs: Recent Labs    10/26/17 0720 10/27/17 0601  HGB 10.6* 10.8*   Recent Labs    10/26/17 0720 10/27/17 0601  WBC 7.3 7.8  RBC 3.35* 3.43*  HCT 32.7* 33.2*  PLT 230 231   Recent Labs    10/26/17 0720 10/27/17 0601  NA 139 140  K 3.5 3.5  CL 110 108  CO2 24 25  BUN 12 10  CREATININE 1.02* 0.94  GLUCOSE 111* 113*  CALCIUM 8.4* 8.8*   No results for input(s): LABPT, INR in the last 72 hours.  EXAM: General - Patient is Alert, Appropriate and Oriented Extremity - Neurovascular intact Sensation intact distally Intact pulses distally Dorsiflexion/Plantar flexion intact Incision - clean, dry, no drainage Motor Function - intact, moving foot and toes well on exam.   Assessment/Plan: 2 Days Post-Op Procedure(s) (LRB): RIGHT TOTAL KNEE ARTHROPLASTY (Right) Procedure(s) (LRB): RIGHT TOTAL KNEE ARTHROPLASTY (Right) Past Medical History:  Diagnosis Date  . Arthritis    Right Shoulder, Feet, Fingers, Back, Neck  . Bleeding nose    History of   . Depression   . Frequent  urination   . GERD (gastroesophageal reflux disease)   . Gestational diabetes   . Heart murmur    echo 10/20/2011  . 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 Hyperlipidemia    Minimal  . Menopausal syndrome   . Migraine without aura    Botox injection 10/21/2017  . OA (osteoarthritis) of knee   . Obese   . Plantar warts   . PMB (postmenopausal bleeding)   . Pneumonia    history of   . PONV (postoperative nausea and vomiting)   . Postop Acute blood loss anemia 11/05/2011  . Ulcerative colitis ([place])   . Urgency of urination   . Vertigo    Principal Problem:   OA (osteoarthritis) of knee  Estimated body mass index is 29.54 kg/m as calculated from the following:   Height as of this encounter: 5\' 6"  (1.676 m).   Weight as of this encounter: 83 kg (183 lb). Up with therapy Discharge home with home health - She wants to look into [facility] for [facility] Therapy Diet - Regular diet Follow up - in 2 weeks Activity - WBAT Disposition - Home Condition Upon Discharge - pending therapy and nausea resolution D/C Meds - See DC Summary DVT Prophylaxis - Xarelto  Arlee Muslim, PA-C Orthopaedic Surgery 10/27/2017, 7:26 AM

## 2017-10-27 NOTE — Telephone Encounter (Signed)
Please advise 

## 2017-10-27 NOTE — Telephone Encounter (Signed)
Sure- does she need me to do anything now?

## 2017-10-27 NOTE — Progress Notes (Signed)
Physical Therapy Treatment Patient Details Name: [name] MRN: [id number] DOB: 08-04-1961 Today's Date: 10/27/2017    History of Present Illness Pt is a 57 year old female s/p R TKA, with hx of L TKA    PT Comments    Pt reporting nausea this morning and during session however attempting to continue.  Pt ambulated in hallway and performed LE exercises.  Pt able to mobilize however still remains nauseated.  Pt will likely d/c home today and had no further questions.   Follow Up Recommendations  Home health PT;Follow surgeon's recommendation for DC plan and follow-up therapies     Equipment Recommendations  None recommended by PT    Recommendations for Other Services       Precautions / Restrictions Precautions Precautions: Knee Precaution Comments: reviewed KI use Required Braces or Orthoses: Knee Immobilizer - Right Knee Immobilizer - Right: Discontinue once straight leg raise with < 10 degree lag Restrictions Other Position/Activity Restrictions: WBAT    Mobility  Bed Mobility Overal bed mobility: Needs Assistance Bed Mobility: Sit to Supine;Supine to Sit     Supine to sit: Min assist Sit to supine: Min assist   General bed mobility comments: verbal cues for technique, assist for R LE due to pain  Transfers Overall transfer level: Needs assistance Equipment used: Rolling walker (2 wheeled) Transfers: Sit to/from Stand Sit to Stand: Min guard         General transfer comment: verbal cues for UE and LE positioning  Ambulation/Gait Ambulation/Gait assistance: Min guard Ambulation Distance (Feet): 160 Feet Assistive device: Rolling walker (2 wheeled) Gait Pattern/deviations: Step-to pattern;Decreased stance time - right     General Gait Details: verbal cues for RW positioning, step length, posture   Stairs            Wheelchair Mobility    Modified Rankin (Stroke Patients Only)       Balance                                             Cognition Arousal/Alertness: Awake/alert Behavior During Therapy: WFL for tasks assessed/performed Overall Cognitive Status: Within Functional Limits for tasks assessed                                        Exercises      General Comments        Pertinent Vitals/Pain Pain Assessment: 0-10 Pain Score: 8 (during exercises, improved with mobility) Pain Location: R knee Pain Descriptors / Indicators: Sore;Aching Pain Intervention(s): Limited activity within patient's tolerance;Monitored during session;Repositioned    Home Living                      Prior Function            PT Goals (current goals can now be found in the care plan section) Progress towards PT goals: Progressing toward goals    Frequency    7X/week      PT Plan Current plan remains appropriate    Co-evaluation              AM-PAC PT "6 Clicks" Daily Activity  Outcome Measure  Difficulty turning over in bed (including adjusting bedclothes, sheets and blankets)?: A Lot Difficulty moving from lying on back  to sitting on the side of the bed? : Unable Difficulty sitting down on and standing up from a chair with arms (e.g., wheelchair, bedside commode, etc,.)?: Unable Help needed moving to and from a bed to chair (including a wheelchair)?: A Little Help needed walking in hospital room?: A Little Help needed climbing 3-5 steps with a railing? : A Little 6 Click Score: 13    End of Session Equipment Utilized During Treatment: Right knee immobilizer Activity Tolerance: Patient limited by pain Patient left: in bed;with call bell/phone within reach   PT Visit Diagnosis: Other abnormalities of gait and mobility (R26.89)     Time: 5697-9480 PT Time Calculation (min) (ACUTE ONLY): 27 min  Charges:  $Gait Training: 8-22 mins $Therapeutic Exercise: 8-22 mins                    G Codes:       Carmelia Bake, PT, DPT 10/27/2017 Pager:  [phone]  [name] 10/27/2017, 2:05 PM

## 2017-10-28 LAB — CBC
HCT: 33.8 % — ABNORMAL LOW (ref 36.0–46.0)
HEMOGLOBIN: 11 g/dL — AB (ref 12.0–15.0)
MCH: 30.9 pg (ref 26.0–34.0)
MCHC: 32.5 g/dL (ref 30.0–36.0)
MCV: 94.9 fL (ref 78.0–100.0)
Platelets: 222 10*3/uL (ref 150–400)
RBC: 3.56 MIL/uL — ABNORMAL LOW (ref 3.87–5.11)
RDW: 14.1 % (ref 11.5–15.5)
WBC: 8.6 10*3/uL (ref 4.0–10.5)

## 2017-10-28 MED ORDER — SALINE SPRAY 0.65 % NA SOLN
1.0000 | NASAL | Status: DC | PRN
  Administered 2017-10-28: 1 via NASAL
  Filled 2017-10-28: qty 44

## 2017-10-28 MED ORDER — GABAPENTIN 300 MG PO CAPS: 300.0000 mg | ORAL_CAPSULE | Freq: Three times a day (TID) | ORAL | 0 refills | Status: DC

## 2017-10-28 MED ORDER — MENTHOL 3 MG MT LOZG
1.0000 | LOZENGE | OROMUCOSAL | Status: DC | PRN
  Filled 2017-10-28: qty 9

## 2017-10-28 MED ORDER — PROMETHAZINE HCL 12.5 MG PO TABS: 12.5000 mg | ORAL_TABLET | Freq: Four times a day (QID) | ORAL | 0 refills | Status: DC | PRN

## 2017-10-28 MED ORDER — HYDROCODONE-ACETAMINOPHEN 5-325 MG PO TABS
1.0000 | ORAL_TABLET | ORAL | Status: DC | PRN
  Administered 2017-10-28 (×2): 2 via ORAL
  Filled 2017-10-28 (×2): qty 2

## 2017-10-28 MED ORDER — RIVAROXABAN 10 MG PO TABS: 10.0000 mg | ORAL_TABLET | Freq: Every day | ORAL | 0 refills | Status: DC

## 2017-10-28 MED ORDER — HYDROCODONE-ACETAMINOPHEN 5-325 MG PO TABS: 1.0000 | ORAL_TABLET | ORAL | 0 refills | Status: DC | PRN

## 2017-10-28 MED ORDER — METHOCARBAMOL 500 MG PO TABS: 500.0000 mg | ORAL_TABLET | Freq: Four times a day (QID) | ORAL | 0 refills | Status: DC | PRN

## 2017-10-28 MED FILL — PROMETHAZINE 12.5 MG TABLET: 12.5 | 3 days supply | Qty: 30 | Fill #0

## 2017-10-28 MED FILL — METHOCARBAMOL 500 MG TABS: 500 | 20 days supply | Qty: 80 | Fill #0

## 2017-10-28 MED FILL — HYDROCODON-APAP 5-325: 5-325 | 7 days supply | Qty: 80 | Fill #0

## 2017-10-28 MED FILL — GABAPENTIN 300 MG CAPS: 300 | 42 days supply | Qty: 84 | Fill #0

## 2017-10-28 MED FILL — XARELTO 10 MG TABLET: 10 | 18 days supply | Qty: 18 | Fill #0

## 2017-10-28 NOTE — Progress Notes (Signed)
   Subjective: 3 Days Post-Op Procedure(s) (LRB): RIGHT TOTAL KNEE ARTHROPLASTY (Right) Patient reports pain as mild.   Patient seen in rounds by Dr. Wynelle Link. Patient is well, and has had no acute complaints or problems except for resolving nausea Patient is ready to go home today.  Objective: Vital signs in last 24 hours: Temp:  [98.8 F (37.1 C)-100.1 F (37.8 C)] 99.9 F (37.7 C) (03/07 0340) Pulse Rate:  [90-101] 101 (03/07 0340) Resp:  [16-18] 16 (03/07 0340) BP: (124-146)/(62-73) 124/62 (03/07 0340) SpO2:  [93 %-100 %] 93 % (03/07 0340)  Intake/Output from previous day:  Intake/Output Summary (Last 24 hours) at 10/28/2017 0905 Last data filed at 10/28/2017 0600 Gross per 24 hour  Intake 985 ml  Output -  Net 985 ml    Intake/Output this shift: No intake/output data recorded.  Labs: Recent Labs    10/26/17 0720 10/27/17 0601 10/28/17 0549  HGB 10.6* 10.8* 11.0*   Recent Labs    10/27/17 0601 10/28/17 0549  WBC 7.8 8.6  RBC 3.43* 3.56*  HCT 33.2* 33.8*  PLT 231 222   Recent Labs    10/26/17 0720 10/27/17 0601  NA 139 140  K 3.5 3.5  CL 110 108  CO2 24 25  BUN 12 10  CREATININE 1.02* 0.94  GLUCOSE 111* 113*  CALCIUM 8.4* 8.8*   No results for input(s): LABPT, INR in the last 72 hours.  EXAM: General - Patient is Alert and Appropriate Extremity - Neurovascular intact Sensation intact distally Incision - clean, dry Motor Function - intact, moving foot and toes well on exam.   Assessment/Plan: 3 Days Post-Op Procedure(s) (LRB): RIGHT TOTAL KNEE ARTHROPLASTY (Right) Procedure(s) (LRB): RIGHT TOTAL KNEE ARTHROPLASTY (Right) Past Medical History:  Diagnosis Date  . Arthritis    Right Shoulder, Feet, Fingers, Back, Neck  . Bleeding nose    History of   . Depression   . Frequent urination   . GERD (gastroesophageal reflux disease)   . Gestational diabetes   . Heart murmur    echo 10/20/2011  . 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  Hyperlipidemia    Minimal  . Menopausal syndrome   . Migraine without aura    Botox injection 10/21/2017  . OA (osteoarthritis) of knee   . Obese   . Plantar warts   . PMB (postmenopausal bleeding)   . Pneumonia    history of   . PONV (postoperative nausea and vomiting)   . Postop Acute blood loss anemia 11/05/2011  . Ulcerative colitis ([place])   . Urgency of urination   . Vertigo    Principal Problem:   OA (osteoarthritis) of knee  Estimated body mass index is 29.54 kg/m as calculated from the following:   Height as of this encounter: 5\' 6"  (1.676 m).   Weight as of this encounter: 83 kg (183 lb). Up with therapy Diet - Diabetic diet Follow up - in 2 weeks Activity - WBAT Disposition - Home Condition Upon Discharge - Stable D/C Meds - See DC Summary DVT Prophylaxis - Xarelto  Arlee Muslim, PA-C Orthopaedic Surgery 10/28/2017, 9:05 AM

## 2017-10-28 NOTE — Progress Notes (Signed)
Physical Therapy Treatment Patient Details Name: [name] MRN: [id number] DOB: [birth year] Today's Date: 10/28/2017    History of Present Illness Pt is a 57 year old female s/p R TKA, with hx of L TKA    PT Comments    Pt ambulated in hallway and performed LE exercises.  Pt reports pain and nausea improved today (however still present).  Pt feels ready for d/c home today and had no further questions.   Follow Up Recommendations  Home health PT;Follow surgeon's recommendation for DC plan and follow-up therapies     Equipment Recommendations  None recommended by PT    Recommendations for Other Services       Precautions / Restrictions Precautions Precautions: Knee Required Braces or Orthoses: Knee Immobilizer - Right Knee Immobilizer - Right: Discontinue once straight leg raise with < 10 degree lag Restrictions Other Position/Activity Restrictions: WBAT    Mobility  Bed Mobility Overal bed mobility: Needs Assistance Bed Mobility: Supine to Sit     Supine to sit: Min guard     General bed mobility comments: verbal cues for technique, cues for self assist  Transfers Overall transfer level: Needs assistance Equipment used: Rolling walker (2 wheeled) Transfers: Sit to/from Stand Sit to Stand: Supervision         General transfer comment: verbal cues for UE and LE positioning  Ambulation/Gait Ambulation/Gait assistance: Min guard Ambulation Distance (Feet): 160 Feet Assistive device: Rolling walker (2 wheeled) Gait Pattern/deviations: Step-to pattern;Decreased stance time - right     General Gait Details: verbal cues for step length, posture   Stairs            Wheelchair Mobility    Modified Rankin (Stroke Patients Only)       Balance                                            Cognition Arousal/Alertness: Awake/alert Behavior During Therapy: WFL for tasks assessed/performed Overall Cognitive Status: Within Functional  Limits for tasks assessed                                        Exercises Total Joint Exercises Ankle Circles/Pumps: AROM;Both;10 reps Quad Sets: AROM;10 reps;Both Short Arc Quad: 10 reps;Right;AAROM Heel Slides: AAROM;10 reps;Right Hip ABduction/ADduction: AAROM;10 reps;Right Straight Leg Raises: AAROM;10 reps;Right Goniometric ROM: approx 50* AAROM R knee    General Comments        Pertinent Vitals/Pain Pain Assessment: 0-10 Pain Score: 4  Pain Descriptors / Indicators: Sore;Aching Pain Intervention(s): Limited activity within patient's tolerance;Repositioned;Monitored during session    Home Living                      Prior Function            PT Goals (current goals can now be found in the care plan section) Progress towards PT goals: Progressing toward goals    Frequency    7X/week      PT Plan Current plan remains appropriate    Co-evaluation              AM-PAC PT "6 Clicks" Daily Activity  Outcome Measure  Difficulty turning over in bed (including adjusting bedclothes, sheets and blankets)?: A Lot Difficulty moving from lying on back  to sitting on the side of the bed? : A Lot Difficulty sitting down on and standing up from a chair with arms (e.g., wheelchair, bedside commode, etc,.)?: A Lot Help needed moving to and from a bed to chair (including a wheelchair)?: A Little Help needed walking in hospital room?: A Little Help needed climbing 3-5 steps with a railing? : A Little 6 Click Score: 15    End of Session Equipment Utilized During Treatment: Right knee immobilizer Activity Tolerance: Patient tolerated treatment well Patient left: with call bell/phone within reach;in chair   PT Visit Diagnosis: Other abnormalities of gait and mobility (R26.89)     Time: 8088-1103 PT Time Calculation (min) (ACUTE ONLY): 27 min  Charges:  $Gait Training: 8-22 mins $Therapeutic Exercise: 8-22 mins                    G  Codes:       Carmelia Bake, PT, DPT 10/28/2017 Pager: [phone]  [name] 10/28/2017, 12:57 PM

## 2017-10-28 NOTE — Telephone Encounter (Signed)
Spoke w/ Tommi Rumps- verbal orders given.

## 2017-10-28 NOTE — Plan of Care (Signed)
Patient has not had a bm x5 days, reports frequent flatus, denies feeling constipated. Educated that nausea can be a symptom of constipation. Patient agreed to take suppository after first PT session.

## 2017-10-28 NOTE — Progress Notes (Signed)
Patient discharged to home with family. Given all belongings, instructions, prescriptions, equipment. Patient and family verbalized understanding of all instructions. Escorted to pov via w/c.

## 2017-10-29 DIAGNOSIS — Z9181 History of falling: Secondary | ICD-10-CM | POA: Diagnosis not present

## 2017-10-29 DIAGNOSIS — Z471 Aftercare following joint replacement surgery: Secondary | ICD-10-CM | POA: Diagnosis not present

## 2017-10-29 DIAGNOSIS — Z96653 Presence of artificial knee joint, bilateral: Secondary | ICD-10-CM | POA: Diagnosis not present

## 2017-10-29 DIAGNOSIS — Z7901 Long term (current) use of anticoagulants: Secondary | ICD-10-CM | POA: Diagnosis not present

## 2017-11-01 DIAGNOSIS — Z471 Aftercare following joint replacement surgery: Secondary | ICD-10-CM | POA: Diagnosis not present

## 2017-11-01 DIAGNOSIS — Z9181 History of falling: Secondary | ICD-10-CM | POA: Diagnosis not present

## 2017-11-01 DIAGNOSIS — Z96653 Presence of artificial knee joint, bilateral: Secondary | ICD-10-CM | POA: Diagnosis not present

## 2017-11-01 DIAGNOSIS — Z7901 Long term (current) use of anticoagulants: Secondary | ICD-10-CM | POA: Diagnosis not present

## 2017-11-03 ENCOUNTER — Encounter: Payer: Self-pay | Admitting: Family Medicine

## 2017-11-03 ENCOUNTER — Other Ambulatory Visit: Payer: Self-pay | Admitting: *Deleted

## 2017-11-03 DIAGNOSIS — Z471 Aftercare following joint replacement surgery: Secondary | ICD-10-CM | POA: Diagnosis not present

## 2017-11-03 DIAGNOSIS — Z7901 Long term (current) use of anticoagulants: Secondary | ICD-10-CM | POA: Diagnosis not present

## 2017-11-03 DIAGNOSIS — Z96653 Presence of artificial knee joint, bilateral: Secondary | ICD-10-CM | POA: Diagnosis not present

## 2017-11-03 DIAGNOSIS — Z9181 History of falling: Secondary | ICD-10-CM | POA: Diagnosis not present

## 2017-11-03 NOTE — Patient Outreach (Signed)
[place] [facility]) Care Management  11/03/2017  [name] 1960-09-01 [id number]  Subjective: Telephone call to patient's home  / mobile number, no answer, left HIPAA compliant voicemail message, and requested call back.     Objective: Per KPN (Knowledge Performance Now, point of care tool) and chart review, patient hospitalized 10/25/17 -10/26/17 for Osteoarthritis  Right knee.   Status post Right  Total Knee Arthroplasty (Depuy Attune) on 10/25/17.       Assessment: Received UMR Preoperative / Transition of care referral on 10/15/17.  Transition of care follow up pending patient contact.      Plan: RNCM will call patient for 2nd telephone outreach attempt, transition of care follow up, within 10 business days if no return call.      Savahanna Almendariz H. Annia Friendly, BSN, [place] [facility] Telephonic CM Phone: [phone] Fax: [phone]

## 2017-11-04 ENCOUNTER — Ambulatory Visit: Payer: Self-pay | Admitting: *Deleted

## 2017-11-04 ENCOUNTER — Other Ambulatory Visit: Payer: Self-pay | Admitting: *Deleted

## 2017-11-04 ENCOUNTER — Encounter: Payer: Self-pay | Admitting: *Deleted

## 2017-11-04 NOTE — Patient Outreach (Signed)
[place] [facility]) Care Management  11/04/2017  [name] 01-08-1961 [id number]  Subjective: Telephone call to patient's home / mobile number, spoke with patient, and HIPAA verified.  Discussed [facility] Care Management UMR Transition of care follow up, patient voiced understanding, and is in agreement to follow up.   Patient states she is doing ok, this is her second knee surgery, is aware of what to expect, woke up with a slight headache this morning, has taken medications for migraine as prescribed, and feeling much better.  States she has a follow up appointment with surgeon on 11/09/17, is receiving home health physical therapy, going well, and is recovering at her mother's home in [place], [place]. States she will follow up on scheduling colonoscopy and abnormal right breast mammogram result in a few months, after she recovers from knee surgery, and has notified these providers of her plans.  Patient states she is able to manage self care and has assistance as needed with activities of daily living / home management.  Patient voices understanding of medical diagnosis, surgery, and treatment plan.  States she is accessing the following [facility] benefits: outpatient pharmacy, hospital indemnity (will file claim if appropriate, verbally given contact number for [facility] [employer] [phone] to request itemized bill), short term disability, and has family medical leave act (FMLA) in place.  States she has accessed her long term disability benefits and has contacted [facility] [name]) regarding paying out of pocket for benefits while she is on leave after her paid annual leave time runs out.  States she will follow up as needed regarding benefit payments.  Patient states she does not have any education material, transition of care, care coordination, disease management, disease monitoring, transportation, community resource, or pharmacy needs at this time.  States she is very appreciative of the follow up and is in agreement to receive [facility] information.      Objective: Per KPN (Knowledge Performance Now, point of care tool) and chart review, patient hospitalized 10/25/17 -10/28/17 for OsteoarthritisRightknee.   Status post RightTotal Knee Arthroplasty (Depuy Attune) on 10/25/17.       Assessment: Received UMR Preoperative / Transition of care referral on 10/15/17.  Transition of care follow up completed, no care management needs, and will proceed with case closure.      Plan: RNCM will send patient successful outreach letter, [facility] pamphlet, and magnet. RNCM will send case closure due to follow up completed / no care management needs request to [facility].       [name], BSN, [place] [facility] Telephonic CM Phone: [phone] Fax: [phone]

## 2017-11-05 DIAGNOSIS — Z471 Aftercare following joint replacement surgery: Secondary | ICD-10-CM | POA: Diagnosis not present

## 2017-11-05 DIAGNOSIS — Z96653 Presence of artificial knee joint, bilateral: Secondary | ICD-10-CM | POA: Diagnosis not present

## 2017-11-05 DIAGNOSIS — Z9181 History of falling: Secondary | ICD-10-CM | POA: Diagnosis not present

## 2017-11-05 DIAGNOSIS — Z7901 Long term (current) use of anticoagulants: Secondary | ICD-10-CM | POA: Diagnosis not present

## 2017-11-08 DIAGNOSIS — Z7901 Long term (current) use of anticoagulants: Secondary | ICD-10-CM | POA: Diagnosis not present

## 2017-11-08 DIAGNOSIS — Z96653 Presence of artificial knee joint, bilateral: Secondary | ICD-10-CM | POA: Diagnosis not present

## 2017-11-08 DIAGNOSIS — Z471 Aftercare following joint replacement surgery: Secondary | ICD-10-CM | POA: Diagnosis not present

## 2017-11-08 DIAGNOSIS — Z9181 History of falling: Secondary | ICD-10-CM | POA: Diagnosis not present

## 2017-11-09 MED FILL — HYDROCODON-APAP 5-325: 5-325 | 10 days supply | Qty: 30 | Fill #0

## 2017-11-10 DIAGNOSIS — Z471 Aftercare following joint replacement surgery: Secondary | ICD-10-CM | POA: Diagnosis not present

## 2017-11-10 DIAGNOSIS — Z96653 Presence of artificial knee joint, bilateral: Secondary | ICD-10-CM | POA: Diagnosis not present

## 2017-11-10 DIAGNOSIS — Z7901 Long term (current) use of anticoagulants: Secondary | ICD-10-CM | POA: Diagnosis not present

## 2017-11-10 DIAGNOSIS — Z9181 History of falling: Secondary | ICD-10-CM | POA: Diagnosis not present

## 2017-11-11 ENCOUNTER — Telehealth: Payer: Self-pay | Admitting: *Deleted

## 2017-11-12 DIAGNOSIS — Z471 Aftercare following joint replacement surgery: Secondary | ICD-10-CM | POA: Diagnosis not present

## 2017-11-12 DIAGNOSIS — Z7901 Long term (current) use of anticoagulants: Secondary | ICD-10-CM | POA: Diagnosis not present

## 2017-11-12 DIAGNOSIS — Z96653 Presence of artificial knee joint, bilateral: Secondary | ICD-10-CM | POA: Diagnosis not present

## 2017-11-12 DIAGNOSIS — Z9181 History of falling: Secondary | ICD-10-CM | POA: Diagnosis not present

## 2017-11-12 NOTE — Telephone Encounter (Signed)
Received Home Health Certification and Plan of Care; forwarded to provider/SLS 03/22 

## 2017-11-15 DIAGNOSIS — Z96653 Presence of artificial knee joint, bilateral: Secondary | ICD-10-CM | POA: Diagnosis not present

## 2017-11-15 DIAGNOSIS — Z7901 Long term (current) use of anticoagulants: Secondary | ICD-10-CM | POA: Diagnosis not present

## 2017-11-15 DIAGNOSIS — Z471 Aftercare following joint replacement surgery: Secondary | ICD-10-CM | POA: Diagnosis not present

## 2017-11-15 DIAGNOSIS — Z9181 History of falling: Secondary | ICD-10-CM | POA: Diagnosis not present

## 2017-11-17 DIAGNOSIS — Z9181 History of falling: Secondary | ICD-10-CM | POA: Diagnosis not present

## 2017-11-17 DIAGNOSIS — Z96653 Presence of artificial knee joint, bilateral: Secondary | ICD-10-CM | POA: Diagnosis not present

## 2017-11-17 DIAGNOSIS — Z7901 Long term (current) use of anticoagulants: Secondary | ICD-10-CM | POA: Diagnosis not present

## 2017-11-17 DIAGNOSIS — Z471 Aftercare following joint replacement surgery: Secondary | ICD-10-CM | POA: Diagnosis not present

## 2017-11-24 MED FILL — VENLAFAXINE HCL ER 75 MG CA: 75 | 90 days supply | Qty: 180 | Fill #0

## 2017-11-24 MED FILL — OXYBUTYNIN CL ER 10 MG TAB: 10 | 30 days supply | Qty: 30 | Fill #2

## 2017-11-30 DIAGNOSIS — Z471 Aftercare following joint replacement surgery: Secondary | ICD-10-CM | POA: Diagnosis not present

## 2017-11-30 DIAGNOSIS — Z96651 Presence of right artificial knee joint: Secondary | ICD-10-CM | POA: Diagnosis not present

## 2018-01-04 MED FILL — OXYBUTYNIN CL ER 10 MG TAB: 10 | 30 days supply | Qty: 30 | Fill #3

## 2018-01-14 ENCOUNTER — Telehealth: Payer: 59 | Admitting: Family Medicine

## 2018-01-14 DIAGNOSIS — H60331 Swimmer's ear, right ear: Secondary | ICD-10-CM

## 2018-01-14 MED ORDER — CIPROFLOXACIN-HYDROCORTISONE 0.2-1 % OT SUSP: 3.0000 [drp] | Freq: Two times a day (BID) | OTIC | 0 refills | Status: AC

## 2018-01-14 MED FILL — BOTOX 100 UNITS VIAL: 100 | 84 days supply | Qty: 2 | Fill #1

## 2018-01-14 MED FILL — CIPRO HC OTIC SUSPENSION: 0.2-1 | 7 days supply | Qty: 10 | Fill #0

## 2018-01-14 NOTE — Progress Notes (Signed)
  We are sorry that you are not feeling well. Here is how we plan to help!  I have prescribed: Ciprofloxin 0.2% and hydrocortisone 1% otic suspension 3 drops in affected ears twice daily until completed   1. Acute swimmer's ear of right side - ciprofloxacin-hydrocortisone (CIPRO HC) OTIC suspension; Place 3 drops into the right ear 2 (two) times daily for 7 days.   This is a condition best visualized in an office if your symptoms are not improved or resolved in the next 3-5 days I suggest face to face evaluation.  In certain cases swimmer's ear may progress to a more serious bacterial infection of the middle or inner ear.  If you have a fever 102 and up and significantly worsening symptoms, this could indicate a more serious infection moving to the middle/inner and needs face to face evaluation in an office by a provider.  Your symptoms should improve over the next 3 days and should resolve in about 7 days.  HOME CARE:   Wash your hands frequently.  Do not place the tip of the bottle on your ear or touch it with your fingers.  You can take Acetominophen 650 mg every 4-6 hours as needed for pain.  If pain is severe or moderate, you can apply a heating pad (set on low) or hot water bottle (wrapped in a towel) to outer ear for 20 minutes.  This will also increase drainage.  Avoid ear plugs  Do not use Q-tips  After showers, help the water run out by tilting your head to one side.  GET HELP RIGHT AWAY IF:   Fever is over 102.2 degrees.  You develop progressive ear pain or hearing loss.  Ear symptoms persist longer than 3 days after treatment.  MAKE SURE YOU:   Understand these instructions.  Will watch your condition.  Will get help right away if you are not doing well or get worse.  TO PREVENT SWIMMER'S EAR:  Use a bathing cap or custom fitted swim molds to keep your ears dry.  Towel off after swimming to dry your ears.  Tilt your head or pull your earlobes to allow  the water to escape your ear canal.  If there is still water in your ears, consider using a hairdryer on the lowest setting.  Thank you for choosing an e-visit. Your e-visit answers were reviewed by a board certified advanced clinical practitioner to complete your personal care plan. Depending upon the condition, your plan could have included both over the counter or prescription medications. Please review your pharmacy choice. Be sure that the pharmacy you have chosen is open so that you can pick up your prescription now.  If there is a problem you may message your provider in [place] to have the prescription routed to another pharmacy. Your safety is important to [place]. If you have drug allergies check your prescription carefully.  For the next 24 hours, you can use MyChart to ask questions about today's visit, request a non-urgent call back, or ask for a work or school excuse from your e-visit provider. You will get an email in the next two days asking about your experience. I hope that your e-visit has been valuable and will speed your recovery.

## 2018-01-17 ENCOUNTER — Encounter: Payer: Self-pay | Admitting: Family Medicine

## 2018-01-18 MED ORDER — OXYBUTYNIN CHLORIDE ER 15 MG PO TB24: 15.0000 mg | ORAL_TABLET | Freq: Every day | ORAL | 6 refills | Status: DC

## 2018-01-18 MED FILL — OXYBUTYNIN CL ER 15 MG TAB: 15 | 30 days supply | Qty: 30 | Fill #0

## 2018-01-21 DIAGNOSIS — M542 Cervicalgia: Secondary | ICD-10-CM | POA: Diagnosis not present

## 2018-01-21 DIAGNOSIS — G43019 Migraine without aura, intractable, without status migrainosus: Secondary | ICD-10-CM | POA: Diagnosis not present

## 2018-01-21 DIAGNOSIS — G43719 Chronic migraine without aura, intractable, without status migrainosus: Secondary | ICD-10-CM | POA: Diagnosis not present

## 2018-01-21 DIAGNOSIS — M791 Myalgia, unspecified site: Secondary | ICD-10-CM | POA: Diagnosis not present

## 2018-01-21 DIAGNOSIS — G518 Other disorders of facial nerve: Secondary | ICD-10-CM | POA: Diagnosis not present

## 2018-01-27 MED ORDER — MIRABEGRON ER 25 MG PO TB24: 25.0000 mg | ORAL_TABLET | Freq: Every day | ORAL | 11 refills | Status: DC

## 2018-01-27 NOTE — Addendum Note (Signed)
Addended by: Lamar Blinks C on: 01/27/2018 03:10 PM   Modules accepted: Orders

## 2018-02-01 MED FILL — TOPIRAMATE 200 MG TABLET: 200 | 90 days supply | Qty: 180 | Fill #0

## 2018-02-01 MED FILL — ELETRIPTAN HBR 40 MG TABLET: 40 | 84 days supply | Qty: 24 | Fill #0

## 2018-02-02 MED FILL — AMOXICILLIN 500 MG CAPSULE: 500 | 5 days supply | Qty: 20 | Fill #0

## 2018-02-08 DIAGNOSIS — H52223 Regular astigmatism, bilateral: Secondary | ICD-10-CM | POA: Diagnosis not present

## 2018-02-08 DIAGNOSIS — H524 Presbyopia: Secondary | ICD-10-CM | POA: Diagnosis not present

## 2018-02-08 DIAGNOSIS — H5213 Myopia, bilateral: Secondary | ICD-10-CM | POA: Diagnosis not present

## 2018-02-10 MED FILL — MYRBETRIQ ER 25 MG TABLET: 25 | 30 days supply | Qty: 30 | Fill #0

## 2018-02-16 MED FILL — VENLAFAXINE HCL ER 75 MG CA: 75 | 90 days supply | Qty: 180 | Fill #0

## 2018-02-18 MED FILL — HYDROCODON-APAP 5-325: 5-325 | 10 days supply | Qty: 10 | Fill #0

## 2018-02-18 MED FILL — OXYBUTYNIN CL ER 15 MG TAB: 15 | 30 days supply | Qty: 30 | Fill #1

## 2018-02-21 MED FILL — DICLOFENAC SOD EC 50 MG TAB: 50 | 3 days supply | Qty: 6 | Fill #0

## 2018-04-04 ENCOUNTER — Encounter: Payer: Self-pay | Admitting: Family Medicine

## 2018-04-04 DIAGNOSIS — N3941 Urge incontinence: Secondary | ICD-10-CM

## 2018-04-05 MED ORDER — OXYBUTYNIN CHLORIDE ER 10 MG PO TB24: 20.0000 mg | ORAL_TABLET | Freq: Every day | ORAL | 3 refills | Status: DC

## 2018-04-05 MED FILL — OXYBUTYNIN CL ER 10 MG TAB: 10 | 90 days supply | Qty: 180 | Fill #0

## 2018-05-09 DIAGNOSIS — G43719 Chronic migraine without aura, intractable, without status migrainosus: Secondary | ICD-10-CM | POA: Diagnosis not present

## 2018-05-12 ENCOUNTER — Telehealth: Payer: 59 | Admitting: Physician Assistant

## 2018-05-12 ENCOUNTER — Encounter: Payer: Self-pay | Admitting: Family Medicine

## 2018-05-12 DIAGNOSIS — B9789 Other viral agents as the cause of diseases classified elsewhere: Secondary | ICD-10-CM

## 2018-05-12 DIAGNOSIS — J019 Acute sinusitis, unspecified: Secondary | ICD-10-CM

## 2018-05-12 MED ORDER — AZELASTINE HCL 0.1 % NA SOLN: 2.0000 | Freq: Two times a day (BID) | NASAL | 0 refills | Status: DC

## 2018-05-12 MED ORDER — BENZONATATE 100 MG PO CAPS: 100.0000 mg | ORAL_CAPSULE | Freq: Two times a day (BID) | ORAL | 0 refills | Status: DC | PRN

## 2018-05-12 NOTE — Progress Notes (Signed)
We are sorry that you are not feeling well.  Here is how we plan to help!  Based on what you have shared with me it looks like you have sinusitis.  Sinusitis is inflammation and infection in the sinus cavities of the head.  Based on your presentation I believe you most likely have Acute Viral Sinusitis.This is an infection most likely caused by a virus. There is not specific treatment for viral sinusitis other than to help you with the symptoms until the infection runs its course.  You may use an oral decongestant such as Mucinex D or if you have glaucoma or high blood pressure use plain Mucinex. Saline nasal spray help and can safely be used as often as needed for congestion, I have prescribed: Azelastine nasal spray 2 sprays in each nostril twice a day. I have also sent in a prescription for Tessalon to help with the cough.   Some authorities believe that zinc sprays or the use of Echinacea may shorten the course of your symptoms.  Sinus infections are not as easily transmitted as other respiratory infection, however we still recommend that you avoid close contact with loved ones, especially the very young and elderly.  Remember to wash your hands thoroughly throughout the day as this is the number one way to prevent the spread of infection!  Home Care:  Only take medications as instructed by your medical team.  Do not take these medications with alcohol.  A steam or ultrasonic humidifier can help congestion.  You can place a towel over your head and breathe in the steam from hot water coming from a faucet.  Avoid close contacts especially the very young and the elderly.  Cover your mouth when you cough or sneeze.  Always remember to wash your hands.  Get Help Right Away If:  You develop worsening fever or sinus pain.  You develop a severe head ache or visual changes.  Your symptoms persist after you have completed your treatment plan.  Make sure you  Understand these  instructions.  Will watch your condition.  Will get help right away if you are not doing well or get worse.  Your e-visit answers were reviewed by a board certified advanced clinical practitioner to complete your personal care plan.  Depending on the condition, your plan could have included both over the counter or prescription medications.  If there is a problem please reply  once you have received a response from your provider.  Your safety is important to [place].  If you have drug allergies check your prescription carefully.    You can use MyChart to ask questions about today's visit, request a non-urgent call back, or ask for a work or school excuse for 24 hours related to this e-Visit. If it has been greater than 24 hours you will need to follow up with your provider, or enter a new e-Visit to address those concerns.  You will get an e-mail in the next two days asking about your experience.  I hope that your e-visit has been valuable and will speed your recovery. Thank you for using e-visits.

## 2018-05-19 DIAGNOSIS — G518 Other disorders of facial nerve: Secondary | ICD-10-CM | POA: Diagnosis not present

## 2018-05-19 DIAGNOSIS — G43019 Migraine without aura, intractable, without status migrainosus: Secondary | ICD-10-CM | POA: Diagnosis not present

## 2018-05-19 DIAGNOSIS — G43719 Chronic migraine without aura, intractable, without status migrainosus: Secondary | ICD-10-CM | POA: Diagnosis not present

## 2018-05-19 DIAGNOSIS — M791 Myalgia, unspecified site: Secondary | ICD-10-CM | POA: Diagnosis not present

## 2018-05-19 DIAGNOSIS — M542 Cervicalgia: Secondary | ICD-10-CM | POA: Diagnosis not present

## 2018-05-31 ENCOUNTER — Other Ambulatory Visit: Payer: Self-pay | Admitting: Physician Assistant

## 2018-05-31 DIAGNOSIS — J019 Acute sinusitis, unspecified: Principal | ICD-10-CM

## 2018-05-31 DIAGNOSIS — B9789 Other viral agents as the cause of diseases classified elsewhere: Secondary | ICD-10-CM

## 2018-06-05 DIAGNOSIS — G459 Transient cerebral ischemic attack, unspecified: Secondary | ICD-10-CM

## 2018-06-05 HISTORY — DX: Transient cerebral ischemic attack, unspecified: G45.9

## 2018-06-08 ENCOUNTER — Encounter: Payer: Self-pay | Admitting: Family Medicine

## 2018-06-08 ENCOUNTER — Ambulatory Visit: Payer: BLUE CROSS/BLUE SHIELD | Admitting: Family Medicine

## 2018-06-08 VITALS — BP 138/80 | HR 81 | Temp 98.1°F | Resp 16 | Ht 66.0 in | Wt 184.4 lb

## 2018-06-08 DIAGNOSIS — Z130 Encounter for screening for diseases of the blood and blood-forming organs and certain disorders involving the immune mechanism: Secondary | ICD-10-CM | POA: Diagnosis not present

## 2018-06-08 DIAGNOSIS — Z13220 Encounter for screening for lipoid disorders: Secondary | ICD-10-CM | POA: Diagnosis not present

## 2018-06-08 DIAGNOSIS — R002 Palpitations: Secondary | ICD-10-CM | POA: Diagnosis not present

## 2018-06-08 DIAGNOSIS — R410 Disorientation, unspecified: Secondary | ICD-10-CM

## 2018-06-08 DIAGNOSIS — Z131 Encounter for screening for diabetes mellitus: Secondary | ICD-10-CM | POA: Diagnosis not present

## 2018-06-08 DIAGNOSIS — J320 Chronic maxillary sinusitis: Secondary | ICD-10-CM

## 2018-06-08 NOTE — Progress Notes (Addendum)
[facility] [place], [place], [place] [address] [phone]  Date:  06/08/2018   Name:  [name]   DOB:  May 29, 1961   MRN:  [id number]  PCP:  Darreld Mclean, MD    Chief Complaint: Stroke Like Symptoms (papitations, felt like she was in a "haze, unable to speak or think about what she was doing, trouble remembering, couldnt hold things, " felt starving" , family histiory of heart disease)   History of Present Illness:  [name] is a 57 y.o. very pleasant female patient who presents with the following:  Seen by myself once in January of this year-  Most recent primary care note in chart from 2015 History of ulcerative colitis, GERD, migraine, depression One of her friends sees Dr. Etter Sjogren so she decided to come and see [place] She lives in [place] She has been a Regulatory affairs officer for many years  She gets periodic botox injections for her migraine headaches- [facility],she has gone there for years  She does use topamax for headaches, but does not know how much this is helping her at this time.  She is thinking of stopping it as it seems to cause memory issues  We did full labs for her back in January and all were ok  She did get a knee replacement in March per [name] and this is going well  Today is Wednesday On Sunday she was at the grocery store- went to get her pick up order.  She felt a little strange handling the 4 way stop by the store but she made it there.  She then felt confused and like she was out of her body or in a fog while picking up her groceries- this lasted 10 or 15 minutes. She was unable to but her credit card in the machine like she normally would.  She did not call for help, she prayed and then started to feel better She felt hungry but she was not fasting at the time She carefully drove home and unloaded her groceries, ate some  food and rested.  She then felt back to normal,  She is feeling ok now  No slurred speech or difficulty speaking/ swallowing No tingling or numbness, no weakness in any particular part of her body No CP, headache or other pain during the episode No vomiting Never had this in the past   She has noted heart palpitations mostly at night recently- she is not sure if this is significant.  No personal history of CV disease or stroke, TIA No CP or SOB outside of this episode either  She has been working really hard and under a lot of stress  She was on xarelto around the time of her knee operation but no longer   Patient Active Problem List   Diagnosis Date Noted  . OA (osteoarthritis) of knee 10/25/2017  . Ulcerative colitis, unspecified  see GI note Dr. Valli Glance 02/2007 05/13/2014  . Post-menopausal bleeding  evalutated  Dr. Lynnette Caffey 03/13/2014  . Blood in stool 03/13/2014  . Fibroadenoma of left breast 04/10/2013  . ANA positive 03/20/2013  . Elevated transaminase measurement 03/15/2013  . Vaginal spotting 12/08/2012  . Abnormal Pap smear 10/13/2011  . Cardiac murmur 10/13/2011  . GERD (gastroesophageal reflux disease) 10/13/2011  . Urticaria 10/13/2011  . Arthritis   . Migraine   .  Depression   . Menopausal syndrome     Past Medical History:  Diagnosis Date  . Arthritis    Right Shoulder, Feet, Fingers, Back, Neck  . Bleeding nose    History of   . Depression   . Frequent urination   . GERD (gastroesophageal reflux disease)   . Gestational diabetes   . Heart murmur    echo 10/20/2011  . 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 Hyperlipidemia    Minimal  . Menopausal syndrome   . Migraine without aura    Botox injection 10/21/2017  . OA (osteoarthritis) of knee   . Obese   . Plantar warts   . PMB (postmenopausal bleeding)   . Pneumonia    history of   . PONV (postoperative nausea and vomiting)   . Postop Acute blood loss anemia 11/05/2011  . Ulcerative colitis ([place])   .  Urgency of urination   . Vertigo     Past Surgical History:  Procedure Laterality Date  . BREAST BIOPSY Left   . COLONOSCOPY    . ear drum patched Right   . KNEE ARTHROSCOPY  2005  . MOUTH SURGERY    . skull fracture surgery     calcinatied bone from forehead  . TONSILLECTOMY  1979  . TOTAL KNEE ARTHROPLASTY  11/02/2011   Procedure: TOTAL KNEE ARTHROPLASTY;  Surgeon: [name], MD;  Location: [facility];  Service: Orthopedics;  Laterality: Left;  . TOTAL KNEE ARTHROPLASTY Right 10/25/2017   Procedure: RIGHT TOTAL KNEE ARTHROPLASTY;  Surgeon: Gaynelle Arabian, MD;  Location: [facility];  Service: Orthopedics;  Laterality: Right;    Social History   Tobacco Use  . Smoking status: Never Smoker  . Smokeless tobacco: Never Used  Substance Use Topics  . Alcohol use: Yes    Comment: occassionally  . Drug use: No    Family History  Problem Relation Age of Onset  . Lung cancer Father   . Thyroid disease Father   . Heart disease Mother        mitral valve prolapse  . Thyroid disease Sister   . Heart disease Maternal Aunt   . Heart disease Maternal Uncle   . Thyroid disease Paternal Aunt   . Heart disease Maternal Grandfather   . Heart disease Paternal Grandfather   . Heart disease Maternal Uncle   . Heart disease Maternal Uncle   . Heart disease Maternal Uncle   . Diabetes Maternal Aunt     Allergies  Allergen Reactions  . Codeine Nausea And Vomiting  . Onion Other (See Comments)    Headache     Medication list has been reviewed and updated.  Current Outpatient Medications on File Prior to Visit  Medication Sig Dispense Refill  . cetirizine (ZYRTEC) 10 MG tablet Take 10 mg by mouth daily.    . diphenhydramine-acetaminophen (TYLENOL PM) 25-500 MG TABS tablet Take 1-2 tablets by mouth at bedtime.    Marland Kitchen eletriptan (RELPAX) 40 MG tablet Take 40 mg by mouth as needed for migraine or headache. May repeat in 2 hours if headache persists or recurs.    . fexofenadine (ALLEGRA)  180 MG tablet Take 180 mg by mouth daily.    . Magnesium 400 MG TABS Take 400 mg by mouth daily.    . methocarbamol (ROBAXIN) 500 MG tablet Take 1 tablet (500 mg total) by mouth every 6 (six) hours as needed for muscle spasms. 80 tablet 0  . omeprazole (PRILOSEC OTC) 20  MG tablet Take 20 mg by mouth daily.    Marland Kitchen oxybutynin (DITROPAN-XL) 10 MG 24 hr tablet Take 2 tablets (20 mg total) by mouth at bedtime. 180 tablet 3  . topiramate (TOPAMAX) 200 MG tablet Take 400 mg by mouth at bedtime.    Marland Kitchen venlafaxine (EFFEXOR-XR) 75 MG 24 hr capsule Take 150 mg by mouth daily after breakfast.      No current facility-administered medications on file prior to visit.     Review of Systems:  As per HPI- otherwise negative.  Physical Examination: Vitals:   06/08/18 1612  BP: 138/80  Pulse: 81  Resp: 16  Temp: 98.1 F (36.7 C)  SpO2: 97%   Vitals:   06/08/18 1612  Weight: 184 lb 6.4 oz (83.6 kg)  Height: 5\' 6"  (1.676 m)   Body mass index is 29.76 kg/m. Ideal Body Weight: Weight in (lb) to have BMI = 25: 154.6  GEN: WDWN, NAD, Non-toxic, A & O x 3, overweight, looks well  HEENT: Atraumatic, Normocephalic. Neck supple. No masses, No LAD.  Bilateral TM wnl, oropharynx normal.  PEERL,EOMI.   Ears and Nose: No external deformity. CV: RRR, No M/G/R. No JVD. No thrill. No extra heart sounds. PULM: CTA B, no wheezes, crackles, rhonchi. No retractions. No resp. distress. No accessory muscle use. ABD: S, NT, ND EXTR: No c/c/e NEURO Normal gait.  PSYCH: Normally interactive. Conversant. Not depressed or anxious appearing.  Calm demeanor.  Neuro exam is wnl at this time - normal strength, sensation and DTR of all limbs. Normal movement and sensation of face Normal RAM of hands Negative romberg  EKG: NSR, no ST elevation or depression  Compared with tracing from this January, no acute change noted   Assessment and Plan: Palpitations - Plan: EKG 12-Lead  Episodic confusion - Plan: EKG 12-Lead, MR  Brain Wo Contrast  Screening for diabetes mellitus - Plan: Comprehensive metabolic panel, Hemoglobin A1c  Screening for hyperlipidemia - Plan: Lipid panel  Screening for deficiency anemia - Plan: CBC  Here today with an acute confusional episode which occurred 3 days ago and lasted for 10- 15 minutes. Sx are now totally resolved.  Advised pt that this may have been a TIA (and thus she may be at risk for an upcoming stroke), and that having her seen in the ER for immediate MRI and then possible admission is most conservative and safest option for management. She declines to do this however, so will plan for an MRI of her head tomorrow and also labs in the AM (as our lab is now closed) Advised pt that if anything like this happens again she needs to seek immediate emergency care and she states understanding  Plan to do MRI of her head and if negative will have her see neurology as an outpt  Signed Lamar Blinks, MD  Called her 10/17 with labs and MRI report  MRI is gladly normal except for sinusitis I will treat with amox She has a neurology appt next week No further unusual sx  Results for orders placed or performed in visit on 06/08/18  CBC  Result Value Ref Range   WBC 5.9 4.0 - 10.5 K/uL   RBC 4.33 3.87 - 5.11 Mil/uL   Platelets 277.0 150.0 - 400.0 K/uL   Hemoglobin 13.7 12.0 - 15.0 g/dL   HCT 41.0 36.0 - 46.0 %   MCV 94.7 78.0 - 100.0 fl   MCHC 33.5 30.0 - 36.0 g/dL   RDW 13.7 11.5 -  15.5 %  Comprehensive metabolic panel  Result Value Ref Range   Sodium 137 135 - 145 mEq/L   Potassium 4.0 3.5 - 5.1 mEq/L   Chloride 104 96 - 112 mEq/L   CO2 24 19 - 32 mEq/L   Glucose, Bld 83 70 - 99 mg/dL   BUN 14 6 - 23 mg/dL   Creatinine, Ser 1.02 0.40 - 1.20 mg/dL   Total Bilirubin 0.4 0.2 - 1.2 mg/dL   Alkaline Phosphatase 66 39 - 117 U/L   AST 12 0 - 37 U/L   ALT 10 0 - 35 U/L   Total Protein 6.8 6.0 - 8.3 g/dL   Albumin 4.2 3.5 - 5.2 g/dL   Calcium 9.2 8.4 - 10.5 mg/dL   GFR  58.94 (L) >60.00 mL/min  Lipid panel  Result Value Ref Range   Cholesterol 196 0 - 200 mg/dL   Triglycerides 63.0 0.0 - 149.0 mg/dL   HDL 63.60 >39.00 mg/dL   VLDL 12.6 0.0 - 40.0 mg/dL   LDL Cholesterol 120 (H) 0 - 99 mg/dL   Total CHOL/HDL Ratio 3    NonHDL 132.88   Hemoglobin A1c  Result Value Ref Range   Hgb A1c MFr Bld 5.5 4.6 - 6.5 %   Mr Brain Wo Contrast  Result Date: 06/09/2018 CLINICAL DATA:  Episodic confusion, acute EXAM: MRI HEAD WITHOUT CONTRAST TECHNIQUE: Multiplanar, multiecho pulse sequences of the brain and surrounding structures were obtained without intravenous contrast. COMPARISON:  None. FINDINGS: Brain: No infarction, hemorrhage, hydrocephalus, extra-axial collection or mass lesion. 2 or 3 FLAIR hyperintensities in the cerebral white matter from nonspecific remote insult. No generalized white matter disease. No atrophy Vascular: Major flow voids are preserved. Skull and upper cervical spine: Left frontal bone osteoma. Sinuses/Orbits: Chronic right maxillary sinusitis with sclerotic wall thickening, opacification, and atelectasis. IMPRESSION: 1. No acute finding or explanation for symptoms. 2. Chronic right maxillary sinusitis. Electronically Signed   By: Monte Fantasia M.D.   On: 06/09/2018 16:05

## 2018-06-08 NOTE — Patient Instructions (Signed)
Please come in tomorrow am and have your blood drawn. I am going to set up an MRI of your brain for tomorrow. Assuming your MRI is normal we can have you see neurology as an outpatient. If there is anything of concern on your MRI we will act on it!   If you have any further symptoms such as what happened to you on Sunday please call 911 or have a friend take you to the ER right away

## 2018-06-09 ENCOUNTER — Encounter: Payer: Self-pay | Admitting: Family Medicine

## 2018-06-09 ENCOUNTER — Ambulatory Visit
Admission: RE | Admit: 2018-06-09 | Discharge: 2018-06-09 | Disposition: A | Discharge status: Home / Self Care | Payer: BLUE CROSS/BLUE SHIELD | Source: Ambulatory Visit | Attending: Family Medicine | Admitting: Family Medicine

## 2018-06-09 ENCOUNTER — Other Ambulatory Visit (INDEPENDENT_AMBULATORY_CARE_PROVIDER_SITE_OTHER): Payer: BLUE CROSS/BLUE SHIELD

## 2018-06-09 DIAGNOSIS — R410 Disorientation, unspecified: Secondary | ICD-10-CM

## 2018-06-09 DIAGNOSIS — Z13220 Encounter for screening for lipoid disorders: Secondary | ICD-10-CM

## 2018-06-09 LAB — COMPREHENSIVE METABOLIC PANEL
ALBUMIN: 4.2 g/dL (ref 3.5–5.2)
ALT: 10 U/L (ref 0–35)
AST: 12 U/L (ref 0–37)
Alkaline Phosphatase: 66 U/L (ref 39–117)
BILIRUBIN TOTAL: 0.4 mg/dL (ref 0.2–1.2)
BUN: 14 mg/dL (ref 6–23)
CO2: 24 mEq/L (ref 19–32)
CREATININE: 1.02 mg/dL (ref 0.40–1.20)
Calcium: 9.2 mg/dL (ref 8.4–10.5)
Chloride: 104 mEq/L (ref 96–112)
GFR: 58.94 mL/min — ABNORMAL LOW (ref >60.00)
Glucose, Bld: 83 mg/dL (ref 70–99)
Potassium: 4 mEq/L (ref 3.5–5.1)
Sodium: 137 mEq/L (ref 135–145)
TOTAL PROTEIN: 6.8 g/dL (ref 6.0–8.3)

## 2018-06-09 LAB — CBC
HCT: 41 % (ref 36.0–46.0)
Hemoglobin: 13.7 g/dL (ref 12.0–15.0)
MCHC: 33.5 g/dL (ref 30.0–36.0)
MCV: 94.7 fl (ref 78.0–100.0)
PLATELETS: 277 10*3/uL (ref 150.0–400.0)
RBC: 4.33 Mil/uL (ref 3.87–5.11)
RDW: 13.7 % (ref 11.5–15.5)
WBC: 5.9 10*3/uL (ref 4.0–10.5)

## 2018-06-09 LAB — LIPID PANEL
CHOLESTEROL: 196 mg/dL (ref 0–200)
HDL: 63.6 mg/dL (ref >39.00)
LDL Cholesterol: 120 mg/dL — ABNORMAL HIGH (ref 0–99)
NonHDL: 132.88
Total CHOL/HDL Ratio: 3
Triglycerides: 63 mg/dL (ref 0.0–149.0)
VLDL: 12.6 mg/dL (ref 0.0–40.0)

## 2018-06-09 LAB — HEMOGLOBIN A1C: HEMOGLOBIN A1C: 5.5 % (ref 4.6–6.5)

## 2018-06-09 MED ORDER — AMOXICILLIN 500 MG PO CAPS: 1000.0000 mg | ORAL_CAPSULE | Freq: Two times a day (BID) | ORAL | 0 refills | Status: DC

## 2018-06-09 NOTE — Addendum Note (Signed)
Addended by: Lamar Blinks C on: 06/09/2018 06:00 PM   Modules accepted: Orders

## 2018-06-15 ENCOUNTER — Ambulatory Visit (HOSPITAL_COMMUNITY)
Admission: RE | Admit: 2018-06-15 | Discharge: 2018-06-15 | Disposition: A | Discharge status: Home / Self Care | Payer: BLUE CROSS/BLUE SHIELD | Source: Ambulatory Visit | Attending: Vascular Surgery | Admitting: Vascular Surgery

## 2018-06-15 ENCOUNTER — Encounter: Payer: Self-pay | Admitting: Diagnostic Neuroimaging

## 2018-06-15 ENCOUNTER — Ambulatory Visit: Payer: BLUE CROSS/BLUE SHIELD | Admitting: Diagnostic Neuroimaging

## 2018-06-15 VITALS — BP 119/81 | HR 88 | Ht 66.0 in | Wt 185.4 lb

## 2018-06-15 DIAGNOSIS — G454 Transient global amnesia: Secondary | ICD-10-CM | POA: Diagnosis not present

## 2018-06-15 DIAGNOSIS — G459 Transient cerebral ischemic attack, unspecified: Secondary | ICD-10-CM | POA: Diagnosis not present

## 2018-06-15 MED ORDER — ASPIRIN EC 81 MG PO TBEC: 81.0000 mg | DELAYED_RELEASE_TABLET | Freq: Every day | ORAL | Status: AC

## 2018-06-15 NOTE — Patient Instructions (Signed)
TRANSIENT CONFUSION (possible TGA, TIA, complex partial seizure) - check EEG, carotid u/s, echocardiogram, heart monitor - start aspirin 44m daily - consider low dose statin vs improving nutrition / exercise (per PCP)

## 2018-06-15 NOTE — Progress Notes (Signed)
[facility]  PATIENT: [name] DOB: 1961-02-27  REFERRING CLINICIAN: J Copland HISTORY FROM: patient  REASON FOR VISIT: new consult    HISTORICAL  CHIEF COMPLAINT:  Chief Complaint  Patient presents with  . Episodic confusion    rm 7, New Pt, hx TIA 06/05/18, did not go to ED, MRI 06/09/18    HISTORY OF PRESENT ILLNESS:   57 year old female here for evaluation of transient confusion.  06/05/2018 patient was at the grocery store, picking up her order at curbside pickup like she normally does, when all of a sudden she seemed to be confused.  She is having difficulty figuring out how to open the trunk, use her credit card, communicate to the grocery clerk.  She remembered asking the same questions over and over.  She needed help to use her credit card.  Ultimately she was able to complete her order and purchase.  Initially she did not feel well enough to drive and waited for a while before she left the grocery store.  Symptoms lasted approximately 15 minutes.  Later patient discussed with some friends and family as well as PCP.  MRI of the brain was ordered which showed no acute findings.  Patient has been having some palpitations lately.  She is also been feeling tired and overextended lately.  She is been quite busy.  No unilateral numbness, weakness, slurred speech, headaches, vision changes.   REVIEW OF SYSTEMS: Full 14 system review of systems performed and negative with exception of: Weight gain palpitations hearing loss snoring easy bruising headaches joint pain.   ALLERGIES: Allergies  Allergen Reactions  . Codeine Nausea And Vomiting  . Onion Other (See Comments)    Headache     HOME MEDICATIONS: Outpatient Medications Prior to Visit  Medication Sig Dispense Refill  . amoxicillin (AMOXIL) 500 MG capsule Take 2 capsules (1,000 mg total) by mouth 2 (two) times daily. 40 capsule 0  . cetirizine (ZYRTEC) 10 MG tablet Take 10 mg by mouth daily.     . diphenhydramine-acetaminophen (TYLENOL PM) 25-500 MG TABS tablet Take 1-2 tablets by mouth at bedtime.    Marland Kitchen eletriptan (RELPAX) 40 MG tablet Take 40 mg by mouth as needed for migraine or headache. May repeat in 2 hours if headache persists or recurs.    . fexofenadine (ALLEGRA) 180 MG tablet Take 180 mg by mouth daily.    . Magnesium 400 MG TABS Take 400 mg by mouth daily.    Marland Kitchen omeprazole (PRILOSEC OTC) 20 MG tablet Take 20 mg by mouth daily.    Marland Kitchen oxybutynin (DITROPAN-XL) 10 MG 24 hr tablet Take 2 tablets (20 mg total) by mouth at bedtime. 180 tablet 3  . topiramate (TOPAMAX) 200 MG tablet Take 400 mg by mouth at bedtime.    Marland Kitchen venlafaxine (EFFEXOR-XR) 75 MG 24 hr capsule Take 150 mg by mouth daily after breakfast.     . methocarbamol (ROBAXIN) 500 MG tablet Take 1 tablet (500 mg total) by mouth every 6 (six) hours as needed for muscle spasms. 80 tablet 0   No facility-administered medications prior to visit.     PAST MEDICAL HISTORY: Past Medical History:  Diagnosis Date  . Arthritis    Right Shoulder, Feet, Fingers, Back, Neck  . Bleeding nose    History of   . Depression   . Frequent urination   . GERD (gastroesophageal reflux disease)   . Gestational diabetes   . Heart murmur    echo 10/20/2011  .  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 Hyperlipidemia    Minimal  . Menopausal syndrome   . Migraine without aura    Botox injection 10/21/2017  . OA (osteoarthritis) of knee   . Obese   . Plantar warts   . PMB (postmenopausal bleeding)   . Pneumonia    history of   . PONV (postoperative nausea and vomiting)   . Postop Acute blood loss anemia 11/05/2011  . TIA (transient ischemic attack) 06/05/2018  . Ulcerative colitis ([place])   . Urgency of urination   . Vertigo     PAST SURGICAL HISTORY: Past Surgical History:  Procedure Laterality Date  . BREAST BIOPSY Left   . COLONOSCOPY    . ear drum patched Right   . KNEE ARTHROSCOPY  2005  . MOUTH SURGERY     gum surgery  .  skull fracture surgery     calcinatied bone from forehead  . TONSILLECTOMY  1979  . TOTAL KNEE ARTHROPLASTY  11/02/2011   Procedure: TOTAL KNEE ARTHROPLASTY;  Surgeon: [name], MD;  Location: [facility];  Service: Orthopedics;  Laterality: Left;  . TOTAL KNEE ARTHROPLASTY Right 10/25/2017   Procedure: RIGHT TOTAL KNEE ARTHROPLASTY;  Surgeon: Gaynelle Arabian, MD;  Location: [facility];  Service: Orthopedics;  Laterality: Right;    FAMILY HISTORY: Family History  Problem Relation Age of Onset  . Lung cancer Father   . Thyroid disease Father   . Stroke Father   . Heart disease Mother        mitral valve prolapse  . Thyroid disease Sister   . Heart disease Maternal Aunt   . Heart disease Maternal Uncle   . Thyroid disease Paternal Aunt   . Heart disease Maternal Grandfather   . Heart disease Paternal Grandfather   . Heart disease Maternal Uncle   . Heart disease Maternal Uncle   . Heart disease Maternal Uncle   . Diabetes Maternal Aunt   . Heart disease Brother     SOCIAL HISTORY: Social History   Socioeconomic History  . Marital status: Divorced    Spouse name: Not on file  . Number of children: 1  . Years of education: Not on file  . Highest education level: Bachelor's degree (e.g., BA, AB, BS)  Occupational History    Comment: Tourist information centre manager  Social Needs  . Financial resource strain: Not on file  . Food insecurity:    Worry: Not on file    Inability: Not on file  . Transportation needs:    Medical: Not on file    Non-medical: Not on file  Tobacco Use  . Smoking status: Never Smoker  . Smokeless tobacco: Never Used  Substance and Sexual Activity  . Alcohol use: Yes    Comment: occassionally  . Drug use: No  . Sexual activity: Not Currently    Birth control/protection: Post-menopausal  Lifestyle  . Physical activity:    Days per week: Not on file    Minutes per session: Not on file  . Stress: Not on file  Relationships  . Social connections:    Talks  on phone: Not on file    Gets together: Not on file    Attends religious service: Not on file    Active member of club or organization: Not on file    Attends meetings of clubs or organizations: Not on file    Relationship status: Not on file  . Intimate partner violence:    Fear  of current or ex partner: Not on file    Emotionally abused: Not on file    Physically abused: Not on file    Forced sexual activity: Not on file  Other Topics Concern  . Not on file  Social History Narrative   Lives alone   Caffeine- coffee, 2-3 cups daily     PHYSICAL EXAM  GENERAL EXAM/CONSTITUTIONAL: Vitals:  Vitals:   06/15/18 1039  BP: 119/81  Pulse: 88  Weight: 185 lb 6.4 oz (84.1 kg)  Height: 5' 6"  (1.676 m)     Body mass index is 29.92 kg/m. Wt Readings from Last 3 Encounters:  06/15/18 185 lb 6.4 oz (84.1 kg)  06/08/18 184 lb 6.4 oz (83.6 kg)  10/25/17 183 lb (83 kg)     Patient is in no distress; well developed, nourished and groomed; neck is supple  CARDIOVASCULAR:  Examination of carotid arteries is normal; no carotid bruits  Regular rate and rhythm, no murmurs  Examination of peripheral vascular system by observation and palpation is normal  EYES:  Ophthalmoscopic exam of optic discs and posterior segments is normal; no papilledema or hemorrhages  Visual Acuity Screening   Right eye Left eye Both eyes  Without correction:     With correction: 20/30 20/30      MUSCULOSKELETAL:  Gait, strength, tone, movements noted in Neurologic exam below  NEUROLOGIC: MENTAL STATUS:  No flowsheet data found.  awake, alert, oriented to person, place and time  recent and remote memory intact  normal attention and concentration  language fluent, comprehension intact, naming intact  fund of knowledge appropriate  CRANIAL NERVE:   2nd - no papilledema on fundoscopic exam  2nd, 3rd, 4th, 6th - pupils equal and reactive to light, visual fields full to confrontation,  extraocular muscles intact, no nystagmus  5th - facial sensation symmetric  7th - facial strength symmetric  8th - hearing intact  9th - palate elevates symmetrically, uvula midline  11th - shoulder shrug symmetric  12th - tongue protrusion midline  MOTOR:   normal bulk and tone, full strength in the BUE, BLE  SENSORY:   normal and symmetric to light touch, temperature, vibration  COORDINATION:   finger-nose-finger, fine finger movements normal  REFLEXES:   deep tendon reflexes present and symmetric  GAIT/STATION:   narrow based gait; able to walk tandem     DIAGNOSTIC DATA (LABS, IMAGING, TESTING) - I reviewed patient records, labs, notes, testing and imaging myself where available.  Lab Results  Component Value Date   WBC 5.9 06/09/2018   HGB 13.7 06/09/2018   HCT 41.0 06/09/2018   MCV 94.7 06/09/2018   PLT 277.0 06/09/2018      Component Value Date/Time   NA 137 06/09/2018 0844   K 4.0 06/09/2018 0844   CL 104 06/09/2018 0844   CO2 24 06/09/2018 0844   GLUCOSE 83 06/09/2018 0844   BUN 14 06/09/2018 0844   CREATININE 1.02 06/09/2018 0844   CREATININE 1.07 10/04/2013 1201   CALCIUM 9.2 06/09/2018 0844   PROT 6.8 06/09/2018 0844   ALBUMIN 4.2 06/09/2018 0844   AST 12 06/09/2018 0844   ALT 10 06/09/2018 0844   ALKPHOS 66 06/09/2018 0844   BILITOT 0.4 06/09/2018 0844   GFRNONAA >60 10/27/2017 0601   GFRAA >60 10/27/2017 0601   Lab Results  Component Value Date   CHOL 196 06/09/2018   HDL 63.60 06/09/2018   LDLCALC 120 (H) 06/09/2018   TRIG 63.0 06/09/2018   CHOLHDL  3 06/09/2018   Lab Results  Component Value Date   HGBA1C 5.5 06/09/2018   No results found for: VITAMINB12 Lab Results  Component Value Date   TSH 1.427 10/04/2013     06/09/18 MRI brain [I reviewed images myself and agree with interpretation. -VRP] 1. No acute finding or explanation for symptoms. 2. Chronic right maxillary sinusitis.     ASSESSMENT AND  PLAN  57 y.o. year old female here with transient confusion spell on 10/313/19 lasting 15 minutes, possibly transient global amnesia versus transient ischemic attack.  Will complete TIA work-up.  Dx:  1. TGA (transient global amnesia)   2. TIA (transient ischemic attack)     PLAN:  TRANSIENT CONFUSION (TGA, TIA, complex partial seizure) - check EEG, carotid u/s, TTE, heart monitor - start aspirin 60m daily - consider low dose statin vs improving nutrition / exercise (per PCP)  Orders Placed This Encounter  Procedures  . Cardiac event monitor  . ECHOCARDIOGRAM COMPLETE  . EEG adult   Return pending test results.  I reviewed images, labs, notes, records myself. I summarized findings and reviewed with patient, for this high risk condition (TIA vs TGA) requiring high complexity decision making.    VPenni Bombard MD 197/74/1423 195:32AM Certified in Neurology, Neurophysiology and Neuroimaging  [facility] [address] [place] [phone]

## 2018-06-16 ENCOUNTER — Encounter: Payer: Self-pay | Admitting: *Deleted

## 2018-06-23 ENCOUNTER — Other Ambulatory Visit: Payer: Self-pay

## 2018-06-23 ENCOUNTER — Ambulatory Visit (HOSPITAL_COMMUNITY): Payer: BLUE CROSS/BLUE SHIELD | Attending: Diagnostic Neuroimaging

## 2018-06-23 DIAGNOSIS — G454 Transient global amnesia: Secondary | ICD-10-CM

## 2018-06-23 DIAGNOSIS — G459 Transient cerebral ischemic attack, unspecified: Secondary | ICD-10-CM | POA: Diagnosis not present

## 2018-06-24 ENCOUNTER — Encounter: Payer: Self-pay | Admitting: Family Medicine

## 2018-06-24 ENCOUNTER — Telehealth: Payer: Self-pay | Admitting: *Deleted

## 2018-06-24 DIAGNOSIS — N3941 Urge incontinence: Secondary | ICD-10-CM

## 2018-06-24 MED ORDER — OXYBUTYNIN CHLORIDE ER 10 MG PO TB24: 20.0000 mg | ORAL_TABLET | Freq: Every day | ORAL | 3 refills | Status: DC

## 2018-06-24 NOTE — Telephone Encounter (Signed)
Spoke with patient and informed her that her echocardiogram result is unremarkable. She has started taking ASA 81 mg daily. She has upcoming EEG, and advised she'll get a call with those results when available. She verbalized understanding, appreciation.

## 2018-06-28 ENCOUNTER — Other Ambulatory Visit: Payer: BLUE CROSS/BLUE SHIELD

## 2018-07-04 ENCOUNTER — Other Ambulatory Visit: Payer: Self-pay | Admitting: Diagnostic Neuroimaging

## 2018-07-04 ENCOUNTER — Ambulatory Visit (INDEPENDENT_AMBULATORY_CARE_PROVIDER_SITE_OTHER): Payer: BLUE CROSS/BLUE SHIELD

## 2018-07-04 DIAGNOSIS — G454 Transient global amnesia: Secondary | ICD-10-CM

## 2018-07-04 DIAGNOSIS — G459 Transient cerebral ischemic attack, unspecified: Secondary | ICD-10-CM

## 2018-07-04 DIAGNOSIS — R002 Palpitations: Secondary | ICD-10-CM | POA: Diagnosis not present

## 2018-07-07 ENCOUNTER — Telehealth: Payer: Self-pay | Admitting: *Deleted

## 2018-07-07 ENCOUNTER — Encounter: Payer: Self-pay | Admitting: Family Medicine

## 2018-07-07 MED ORDER — NITROFURANTOIN MONOHYD MACRO 100 MG PO CAPS: 100.0000 mg | ORAL_CAPSULE | Freq: Two times a day (BID) | ORAL | 0 refills | Status: DC

## 2018-07-07 NOTE — Telephone Encounter (Signed)
Replied via my chart with Dr Gladstone Lighter recommendations, advice.

## 2018-07-07 NOTE — Telephone Encounter (Signed)
I recommend to complete 30 day monitoring. Follow up with cardiology/monitoring office re: heart monitor technical issues. -VRP

## 2018-07-07 NOTE — Telephone Encounter (Signed)
Received this my chart message:  I have a question for Dr. Leta Baptist please.  I have had this heart monitor on  since Monday.  Last night I tried to charge the sensor and it would not.  I am going to try again tonight.  I know that isn't your offices problem first of all and I have to go through the company that makes it.  My biggest concern is that about half the time the monitor losses connection and this happens all the time.  I feel that to have to pay for this thing for 30 days is a huge waste of money if it isn't even connecting enough to do any good.  I feel fine and haven't had any palpitations since all this started.  Would you please ask if we can cut my time on this down to half.   Thank you so much. [name]  This RN replied re: call monitor company today to get issues resolved. Will discuss shortening monitoring with Dr Leta Baptist and let her know.

## 2018-07-13 ENCOUNTER — Ambulatory Visit: Payer: BLUE CROSS/BLUE SHIELD | Admitting: Diagnostic Neuroimaging

## 2018-07-13 DIAGNOSIS — G454 Transient global amnesia: Secondary | ICD-10-CM

## 2018-07-13 DIAGNOSIS — G459 Transient cerebral ischemic attack, unspecified: Secondary | ICD-10-CM

## 2018-07-13 DIAGNOSIS — R410 Disorientation, unspecified: Secondary | ICD-10-CM | POA: Diagnosis not present

## 2018-07-15 NOTE — Procedures (Signed)
   [facility]  EEG (ELECTROENCEPHALOGRAM) REPORT   STUDY DATE: 07/13/18 PATIENT NAME: [name] DOB: 04-12-1961 MRN: [id number]  ORDERING CLINICIAN: Andrey Spearman, MD   TECHNOLOGIST: Arelia Longest  TECHNIQUE: Electroencephalogram was recorded utilizing standard 10-20 system of lead placement and reformatted into average and bipolar montages.  RECORDING TIME: 21 minutes  ACTIVATION: hyperventilation and photic stimulation  CLINICAL INFORMATION: 57 year old female with transient confusion.  FINDINGS: Posterior dominant background rhythms, which attenuate with eye opening, ranging 9-10 hertz and 30-40 microvolts. No focal, lateralizing, epileptiform activity or seizures are seen. Patient recorded in the awake and drowsy state. EKG channel shows regular rhythm of 75-80 beats per minute.   IMPRESSION:   Normal EEG in the awake and drowsy states.    INTERPRETING PHYSICIAN:  Penni Bombard, MD Certified in Neurology, Neurophysiology and Neuroimaging  [facility] [address] [place], [place] [address] [phone]

## 2018-07-18 ENCOUNTER — Encounter: Payer: Self-pay | Admitting: *Deleted

## 2018-07-27 ENCOUNTER — Telehealth: Payer: Self-pay | Admitting: Diagnostic Neuroimaging

## 2018-07-27 NOTE — Telephone Encounter (Signed)
Pt requesting a call stating mychart will only show her a summary on the EEG results, would like to discuss in greater detail.

## 2018-07-28 NOTE — Telephone Encounter (Signed)
Called patient to discuss. She stated that all she is trying to do is get a copy of her full EEG report. This RN advised she come in, sign a release and get a copy of EEG. She Patient verbalized understanding, appreciation.

## 2018-08-10 DIAGNOSIS — G43719 Chronic migraine without aura, intractable, without status migrainosus: Secondary | ICD-10-CM | POA: Diagnosis not present

## 2018-08-12 ENCOUNTER — Encounter: Payer: Self-pay | Admitting: *Deleted

## 2018-08-22 DIAGNOSIS — M791 Myalgia, unspecified site: Secondary | ICD-10-CM | POA: Diagnosis not present

## 2018-08-22 DIAGNOSIS — G43019 Migraine without aura, intractable, without status migrainosus: Secondary | ICD-10-CM | POA: Diagnosis not present

## 2018-08-22 DIAGNOSIS — G43719 Chronic migraine without aura, intractable, without status migrainosus: Secondary | ICD-10-CM | POA: Diagnosis not present

## 2018-08-22 DIAGNOSIS — M542 Cervicalgia: Secondary | ICD-10-CM | POA: Diagnosis not present

## 2018-08-22 DIAGNOSIS — G518 Other disorders of facial nerve: Secondary | ICD-10-CM | POA: Diagnosis not present

## 2018-11-10 ENCOUNTER — Encounter: Payer: Self-pay | Admitting: Family Medicine

## 2018-11-22 DIAGNOSIS — G43719 Chronic migraine without aura, intractable, without status migrainosus: Secondary | ICD-10-CM | POA: Diagnosis not present

## 2018-11-24 DIAGNOSIS — M791 Myalgia, unspecified site: Secondary | ICD-10-CM | POA: Diagnosis not present

## 2018-11-24 DIAGNOSIS — G518 Other disorders of facial nerve: Secondary | ICD-10-CM | POA: Diagnosis not present

## 2018-11-24 DIAGNOSIS — G43019 Migraine without aura, intractable, without status migrainosus: Secondary | ICD-10-CM | POA: Diagnosis not present

## 2018-11-24 DIAGNOSIS — G43719 Chronic migraine without aura, intractable, without status migrainosus: Secondary | ICD-10-CM | POA: Diagnosis not present

## 2018-11-24 DIAGNOSIS — M542 Cervicalgia: Secondary | ICD-10-CM | POA: Diagnosis not present

## 2019-02-08 DIAGNOSIS — G43719 Chronic migraine without aura, intractable, without status migrainosus: Secondary | ICD-10-CM | POA: Diagnosis not present

## 2019-02-16 DIAGNOSIS — M542 Cervicalgia: Secondary | ICD-10-CM | POA: Diagnosis not present

## 2019-02-16 DIAGNOSIS — G43719 Chronic migraine without aura, intractable, without status migrainosus: Secondary | ICD-10-CM | POA: Diagnosis not present

## 2019-02-16 DIAGNOSIS — G43019 Migraine without aura, intractable, without status migrainosus: Secondary | ICD-10-CM | POA: Diagnosis not present

## 2019-03-21 ENCOUNTER — Encounter: Payer: Self-pay | Admitting: Family Medicine

## 2019-03-21 DIAGNOSIS — H9209 Otalgia, unspecified ear: Secondary | ICD-10-CM

## 2019-03-21 DIAGNOSIS — R42 Dizziness and giddiness: Secondary | ICD-10-CM

## 2019-03-23 MED ORDER — MECLIZINE HCL 12.5 MG PO TABS: 12.5000 mg | ORAL_TABLET | Freq: Three times a day (TID) | ORAL | 0 refills | Status: DC | PRN

## 2019-03-23 NOTE — Addendum Note (Signed)
Addended by: Lamar Blinks C on: 03/23/2019 08:27 PM   Modules accepted: Orders

## 2019-03-23 NOTE — Addendum Note (Signed)
Addended by: Lamar Blinks C on: 03/23/2019 12:39 PM   Modules accepted: Orders

## 2019-04-11 DIAGNOSIS — Z7289 Other problems related to lifestyle: Secondary | ICD-10-CM | POA: Diagnosis not present

## 2019-04-11 DIAGNOSIS — R42 Dizziness and giddiness: Secondary | ICD-10-CM | POA: Diagnosis not present

## 2019-04-11 DIAGNOSIS — J343 Hypertrophy of nasal turbinates: Secondary | ICD-10-CM | POA: Diagnosis not present

## 2019-04-11 DIAGNOSIS — S00411A Abrasion of right ear, initial encounter: Secondary | ICD-10-CM | POA: Diagnosis not present

## 2019-05-12 DIAGNOSIS — G43719 Chronic migraine without aura, intractable, without status migrainosus: Secondary | ICD-10-CM | POA: Diagnosis not present

## 2019-06-07 DIAGNOSIS — G43719 Chronic migraine without aura, intractable, without status migrainosus: Secondary | ICD-10-CM | POA: Diagnosis not present

## 2019-06-07 DIAGNOSIS — M542 Cervicalgia: Secondary | ICD-10-CM | POA: Diagnosis not present

## 2019-06-07 DIAGNOSIS — G43019 Migraine without aura, intractable, without status migrainosus: Secondary | ICD-10-CM | POA: Diagnosis not present

## 2019-06-20 DIAGNOSIS — Z20828 Contact with and (suspected) exposure to other viral communicable diseases: Secondary | ICD-10-CM | POA: Diagnosis not present

## 2019-07-02 ENCOUNTER — Other Ambulatory Visit: Payer: Self-pay | Admitting: Family Medicine

## 2019-07-02 DIAGNOSIS — N3941 Urge incontinence: Secondary | ICD-10-CM

## 2019-07-14 DIAGNOSIS — K529 Noninfective gastroenteritis and colitis, unspecified: Secondary | ICD-10-CM | POA: Diagnosis not present

## 2019-07-14 DIAGNOSIS — Z20828 Contact with and (suspected) exposure to other viral communicable diseases: Secondary | ICD-10-CM | POA: Diagnosis not present

## 2019-07-24 ENCOUNTER — Encounter: Payer: Self-pay | Admitting: Family Medicine

## 2019-08-06 ENCOUNTER — Encounter: Payer: Self-pay | Admitting: Family Medicine

## 2019-08-06 DIAGNOSIS — N3941 Urge incontinence: Secondary | ICD-10-CM

## 2019-08-07 ENCOUNTER — Other Ambulatory Visit: Payer: Self-pay | Admitting: Family Medicine

## 2019-08-07 DIAGNOSIS — N3941 Urge incontinence: Secondary | ICD-10-CM

## 2019-08-07 DIAGNOSIS — Z20828 Contact with and (suspected) exposure to other viral communicable diseases: Secondary | ICD-10-CM | POA: Diagnosis not present

## 2019-08-08 MED ORDER — OXYBUTYNIN CHLORIDE ER 10 MG PO TB24: 20.0000 mg | ORAL_TABLET | Freq: Every day | ORAL | 3 refills | Status: DC

## 2019-08-08 NOTE — Addendum Note (Signed)
Addended by: Lamar Blinks C on: 08/08/2019 06:19 AM   Modules accepted: Orders

## 2019-08-11 MED ORDER — ONDANSETRON HCL 4 MG PO TABS: 4.0000 mg | ORAL_TABLET | Freq: Three times a day (TID) | ORAL | 0 refills | Status: DC | PRN

## 2019-08-11 NOTE — Addendum Note (Signed)
Addended by: Lamar Blinks C on: 08/11/2019 10:57 AM   Modules accepted: Orders

## 2019-08-13 DIAGNOSIS — Z79899 Other long term (current) drug therapy: Secondary | ICD-10-CM | POA: Diagnosis not present

## 2019-08-13 DIAGNOSIS — R05 Cough: Secondary | ICD-10-CM | POA: Diagnosis not present

## 2019-08-13 DIAGNOSIS — R110 Nausea: Secondary | ICD-10-CM | POA: Diagnosis not present

## 2019-08-13 DIAGNOSIS — R0602 Shortness of breath: Secondary | ICD-10-CM | POA: Diagnosis not present

## 2019-08-13 DIAGNOSIS — R918 Other nonspecific abnormal finding of lung field: Secondary | ICD-10-CM | POA: Diagnosis not present

## 2019-08-13 DIAGNOSIS — M1909 Primary osteoarthritis, other specified site: Secondary | ICD-10-CM | POA: Diagnosis not present

## 2019-08-13 DIAGNOSIS — J1289 Other viral pneumonia: Secondary | ICD-10-CM | POA: Diagnosis not present

## 2019-08-13 DIAGNOSIS — M6281 Muscle weakness (generalized): Secondary | ICD-10-CM | POA: Diagnosis not present

## 2019-08-13 DIAGNOSIS — U071 COVID-19: Secondary | ICD-10-CM | POA: Diagnosis not present

## 2019-08-13 DIAGNOSIS — R509 Fever, unspecified: Secondary | ICD-10-CM | POA: Diagnosis not present

## 2019-08-13 DIAGNOSIS — Z87891 Personal history of nicotine dependence: Secondary | ICD-10-CM | POA: Diagnosis not present

## 2019-08-16 ENCOUNTER — Ambulatory Visit (INDEPENDENT_AMBULATORY_CARE_PROVIDER_SITE_OTHER): Payer: BC Managed Care – PPO | Admitting: Family Medicine

## 2019-08-16 ENCOUNTER — Encounter: Payer: Self-pay | Admitting: Family Medicine

## 2019-08-16 ENCOUNTER — Other Ambulatory Visit: Payer: Self-pay

## 2019-08-16 DIAGNOSIS — R059 Cough, unspecified: Secondary | ICD-10-CM

## 2019-08-16 DIAGNOSIS — U071 COVID-19: Secondary | ICD-10-CM

## 2019-08-16 DIAGNOSIS — R05 Cough: Secondary | ICD-10-CM

## 2019-08-16 MED ORDER — HYDROCODONE-HOMATROPINE 5-1.5 MG/5ML PO SYRP: 5.0000 mL | ORAL_SOLUTION | Freq: Three times a day (TID) | ORAL | 0 refills | Status: AC | PRN

## 2019-08-16 NOTE — Progress Notes (Signed)
[facility] at [facility] [address], [place], [place] [address] [phone]  Date:  08/16/2019   Name:  [name]   DOB:  Oct 10, 1960   MRN:  [id number]  PCP:  Darreld Mclean, MD    Chief Complaint: No chief complaint on file.   History of Present Illness:  [name] is a 58 y.o. very pleasant female patient who presents with the following:  Virtual visit today to discuss symptoms of COVID-19 infection Patient location is home, provider location is home Patient identity confirmed with 2 factors, she gives consent for virtual visit today  The patient and myself are present on the call today  [name] was seen in the emergency room on December 20 at [facility] She had tested positive for Covid on 12/14, was feeling worse so she went to the ER  Her chest x-ray showed bilateral lung infiltrates typical of COVID-19, she also had a CT angio due to positive D-dimer which was negative for PE  General comments: 63-year-old female presenting with a Covid 19 infection. She has increasing weakness over the course of the last week. She really has not had much of an appetite at all. Minimal shortness of breath. Oxygen saturations are under percent. Her D-dimer is mildly elevated in the emergency department. CT angiogram shows bilateral infiltrates consistent with Covid pneumonia. O2 saturation 95 to 97%. PO2 on ABG was 100%. Patient felt much better with IV fluid and ondansetron. She is comfortable being discharged home. She will return to the emergency department feels worse otherwise should follow with primary care provider. New Prescriptions  ALBUTEROL SULFATE HFA (PROVENTIL,VENTOLIN,PROAIR) 108 (90 BASE) MCG/ACT INHALER Inhale one puff to two puffs into the lungs every 6 (six) hours as needed for Wheezing.  Quantity: 2 g Refills: 0  DEXAMETHASONE (DECADRON) 6 MG TABLET Take one tablet (6 mg dose) by mouth with breakfast.   Quantity: 9 tablet Refills: 0    We have exchanged approximately 25 MyChart messages since her Covid diagnosis, I asked her to do a virtual visit today to discuss current cough  Today [name] notes that she is very tired, but her cough is keeping her up at night so she cannot get the sleep she needs She is coughing during the day as well Her cough is giving her a headache She is using delsym OTC but it is not really helping Nausea is improved but not gone She is not eating much- appetite is small She is drinking pedialyte She has reduced desire to eat- she is drinking liquids however  No fever noted- she was having chills but these are better  Overall her covid symptoms are improved but still present  She has history of nausea with codeine but not true allergy  Patient Active Problem List   Diagnosis Date Noted  . OA (osteoarthritis) of knee 10/25/2017  . Ulcerative colitis, unspecified  see GI note Dr. Valli Glance 02/2007 05/13/2014  . Post-menopausal bleeding  evalutated  Dr. Lynnette Caffey 03/13/2014  . Blood in stool 03/13/2014  . Fibroadenoma of left breast 04/10/2013  . ANA positive 03/20/2013  . Elevated transaminase measurement 03/15/2013  . Vaginal spotting 12/08/2012  . Abnormal Pap smear 10/13/2011  . Cardiac murmur 10/13/2011  . GERD (gastroesophageal reflux disease) 10/13/2011  . Urticaria 10/13/2011  . Arthritis   . Migraine   . Depression   . Menopausal syndrome     Past Medical History:  Diagnosis Date  .  Arthritis    Right Shoulder, Feet, Fingers, Back, Neck  . Bleeding nose    History of   . Depression   . Frequent urination   . GERD (gastroesophageal reflux disease)   . Gestational diabetes   . Heart murmur    echo 10/20/2011  . 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 Hyperlipidemia    Minimal  . Menopausal syndrome   . Migraine without aura    Botox injection 10/21/2017  . OA (osteoarthritis) of knee   . Obese   . Plantar warts   . PMB (postmenopausal  bleeding)   . Pneumonia    history of   . PONV (postoperative nausea and vomiting)   . Postop Acute blood loss anemia 11/05/2011  . TIA (transient ischemic attack) 06/05/2018  . Ulcerative colitis ([place])   . Urgency of urination   . Vertigo     Past Surgical History:  Procedure Laterality Date  . BREAST BIOPSY Left   . COLONOSCOPY    . ear drum patched Right   . KNEE ARTHROSCOPY  2005  . MOUTH SURGERY     gum surgery  . skull fracture surgery     calcinatied bone from forehead  . TONSILLECTOMY  1979  . TOTAL KNEE ARTHROPLASTY  11/02/2011   Procedure: TOTAL KNEE ARTHROPLASTY;  Surgeon: [name], MD;  Location: [facility];  Service: Orthopedics;  Laterality: Left;  . TOTAL KNEE ARTHROPLASTY Right 10/25/2017   Procedure: RIGHT TOTAL KNEE ARTHROPLASTY;  Surgeon: Gaynelle Arabian, MD;  Location: [facility];  Service: Orthopedics;  Laterality: Right;    Social History   Tobacco Use  . Smoking status: Never Smoker  . Smokeless tobacco: Never Used  Substance Use Topics  . Alcohol use: Yes    Comment: occassionally  . Drug use: No    Family History  Problem Relation Age of Onset  . Lung cancer Father   . Thyroid disease Father   . Stroke Father   . Heart disease Mother        mitral valve prolapse  . Thyroid disease Sister   . Heart disease Maternal Aunt   . Heart disease Maternal Uncle   . Thyroid disease Paternal Aunt   . Heart disease Maternal Grandfather   . Heart disease Paternal Grandfather   . Heart disease Maternal Uncle   . Heart disease Maternal Uncle   . Heart disease Maternal Uncle   . Diabetes Maternal Aunt   . Heart disease Brother     Allergies  Allergen Reactions  . Codeine Nausea And Vomiting  . Onion Other (See Comments)    Headache     Medication list has been reviewed and updated.  Current Outpatient Medications on File Prior to Visit  Medication Sig Dispense Refill  . aspirin EC 81 MG tablet Take 1 tablet (81 mg total) by mouth daily.    .  cetirizine (ZYRTEC) 10 MG tablet Take 10 mg by mouth daily.    . diphenhydramine-acetaminophen (TYLENOL PM) 25-500 MG TABS tablet Take 1-2 tablets by mouth at bedtime.    Marland Kitchen eletriptan (RELPAX) 40 MG tablet Take 40 mg by mouth as needed for migraine or headache. May repeat in 2 hours if headache persists or recurs.    . fexofenadine (ALLEGRA) 180 MG tablet Take 180 mg by mouth daily.    . Magnesium 400 MG TABS Take 400 mg by mouth daily.    . meclizine (ANTIVERT) 12.5 MG tablet Take 1 tablet (12.5  mg total) by mouth 3 (three) times daily as needed for dizziness. 30 tablet 0  . nitrofurantoin, macrocrystal-monohydrate, (MACROBID) 100 MG capsule Take 1 capsule (100 mg total) by mouth 2 (two) times daily. 14 capsule 0  . omeprazole (PRILOSEC OTC) 20 MG tablet Take 20 mg by mouth daily.    . ondansetron (ZOFRAN) 4 MG tablet Take 1 tablet (4 mg total) by mouth every 8 (eight) hours as needed for nausea or vomiting. 20 tablet 0  . oxybutynin (DITROPAN-XL) 10 MG 24 hr tablet Take 2 tablets (20 mg total) by mouth at bedtime. 180 tablet 3  . topiramate (TOPAMAX) 200 MG tablet Take 400 mg by mouth at bedtime.    Marland Kitchen venlafaxine (EFFEXOR-XR) 75 MG 24 hr capsule Take 150 mg by mouth daily after breakfast.      No current facility-administered medications on file prior to visit.    Review of Systems:  As per HPI- otherwise negative.   Physical Examination: There were no vitals filed for this visit. There were no vitals filed for this visit. There is no height or weight on file to calculate BMI. Ideal Body Weight:    Pt observed over video monitor She looks well, no cough, wheezing or distress is observed  Assessment and Plan: Cough - Plan: HYDROcodone-homatropine (HYCODAN) 5-1.5 MG/5ML syrup  COVID-19  Recnet illness with covid 19, bothered by severe cough that prevents sleep rx for hycodan written for pt- discussed how to use this safely and risk of sedation  She will let me know if not making  progress, she can contact me via mychart anytime   Signed Lamar Blinks, MD

## 2019-08-19 ENCOUNTER — Encounter: Payer: Self-pay | Admitting: Family Medicine

## 2019-09-13 DIAGNOSIS — G43719 Chronic migraine without aura, intractable, without status migrainosus: Secondary | ICD-10-CM | POA: Diagnosis not present

## 2019-09-14 DIAGNOSIS — G43019 Migraine without aura, intractable, without status migrainosus: Secondary | ICD-10-CM | POA: Diagnosis not present

## 2019-09-14 DIAGNOSIS — M542 Cervicalgia: Secondary | ICD-10-CM | POA: Diagnosis not present

## 2019-09-14 DIAGNOSIS — G43719 Chronic migraine without aura, intractable, without status migrainosus: Secondary | ICD-10-CM | POA: Diagnosis not present

## 2019-10-24 DIAGNOSIS — F4322 Adjustment disorder with anxiety: Secondary | ICD-10-CM | POA: Diagnosis not present

## 2019-10-29 ENCOUNTER — Encounter: Payer: Self-pay | Admitting: Family Medicine

## 2019-12-01 DIAGNOSIS — G43719 Chronic migraine without aura, intractable, without status migrainosus: Secondary | ICD-10-CM | POA: Diagnosis not present

## 2019-12-14 DIAGNOSIS — G43019 Migraine without aura, intractable, without status migrainosus: Secondary | ICD-10-CM | POA: Diagnosis not present

## 2019-12-14 DIAGNOSIS — M542 Cervicalgia: Secondary | ICD-10-CM | POA: Diagnosis not present

## 2019-12-14 DIAGNOSIS — G43719 Chronic migraine without aura, intractable, without status migrainosus: Secondary | ICD-10-CM | POA: Diagnosis not present

## 2019-12-29 DIAGNOSIS — F331 Major depressive disorder, recurrent, moderate: Secondary | ICD-10-CM | POA: Diagnosis not present

## 2019-12-29 DIAGNOSIS — F900 Attention-deficit hyperactivity disorder, predominantly inattentive type: Secondary | ICD-10-CM | POA: Diagnosis not present

## 2020-01-25 DIAGNOSIS — F320 Major depressive disorder, single episode, mild: Secondary | ICD-10-CM | POA: Diagnosis not present

## 2020-01-25 DIAGNOSIS — G43709 Chronic migraine without aura, not intractable, without status migrainosus: Secondary | ICD-10-CM | POA: Diagnosis not present

## 2020-03-11 DIAGNOSIS — G43719 Chronic migraine without aura, intractable, without status migrainosus: Secondary | ICD-10-CM | POA: Diagnosis not present

## 2020-03-18 ENCOUNTER — Telehealth: Payer: BC Managed Care – PPO | Admitting: Family

## 2020-03-18 ENCOUNTER — Encounter: Payer: Self-pay | Admitting: Family Medicine

## 2020-03-18 DIAGNOSIS — J019 Acute sinusitis, unspecified: Secondary | ICD-10-CM

## 2020-03-18 MED ORDER — AMOXICILLIN-POT CLAVULANATE 875-125 MG PO TABS: 1.0000 | ORAL_TABLET | Freq: Two times a day (BID) | ORAL | 0 refills | Status: DC

## 2020-03-18 NOTE — Progress Notes (Signed)

## 2020-04-01 DIAGNOSIS — Z03818 Encounter for observation for suspected exposure to other biological agents ruled out: Secondary | ICD-10-CM | POA: Diagnosis not present

## 2020-04-01 DIAGNOSIS — Z20822 Contact with and (suspected) exposure to covid-19: Secondary | ICD-10-CM | POA: Diagnosis not present

## 2020-04-19 ENCOUNTER — Encounter: Payer: Self-pay | Admitting: Family Medicine

## 2020-04-19 DIAGNOSIS — N3941 Urge incontinence: Secondary | ICD-10-CM

## 2020-04-21 ENCOUNTER — Other Ambulatory Visit: Payer: Self-pay | Admitting: Family Medicine

## 2020-04-21 DIAGNOSIS — N3941 Urge incontinence: Secondary | ICD-10-CM

## 2020-04-21 MED ORDER — OXYBUTYNIN CHLORIDE ER 10 MG PO TB24: ORAL_TABLET | ORAL | 3 refills | Status: DC

## 2020-04-21 MED ORDER — OXYBUTYNIN CHLORIDE ER 10 MG PO TB24
30.0000 mg | ORAL_TABLET | Freq: Every day | ORAL | 3 refills | Status: DC
Start: 2020-04-21 — End: 2020-04-21

## 2020-04-25 ENCOUNTER — Encounter: Payer: Self-pay | Admitting: Family Medicine

## 2020-04-25 ENCOUNTER — Telehealth: Payer: Self-pay

## 2020-04-25 NOTE — Telephone Encounter (Signed)
PA initiated via Covermymeds; KEY: [id number]. PA approved.

## 2020-05-03 IMAGING — MR MR HEAD W/O CM
10 series · 48 of 48 positions shown · non-contrast
Comparison: None.

CLINICAL DATA: Episodic confusion, acute

EXAM:
MRI HEAD WITHOUT CONTRAST
TECHNIQUE: Multiplanar, multiecho pulse sequences of the brain and surrounding
structures were obtained without intravenous contrast.

[Series 2: T1 · sagittal · 5.0mm · 0.45mm/px · 2 of 21 slices shown]
[im 1/21]
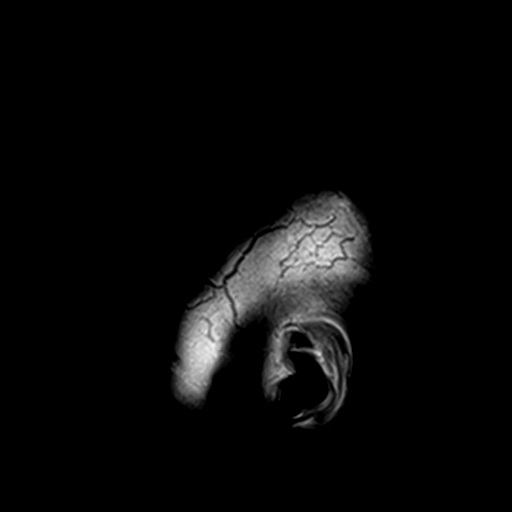
[im 21/21]
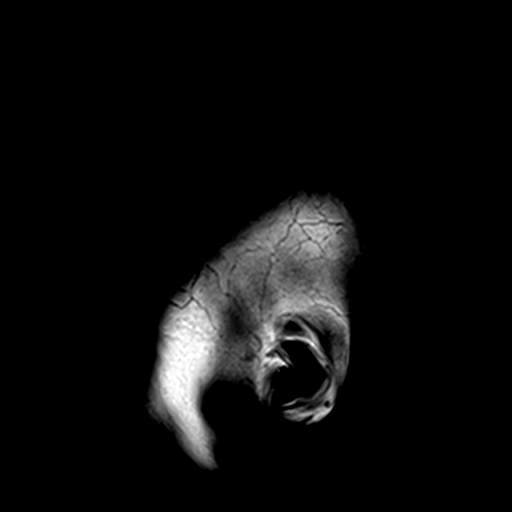

[Series 3: DWI · axial · 3.0mm · 1.80mm/px · z∈[-44,+107]mm · 9 of 103 slices shown (1 of 4)]
[im 1/103]
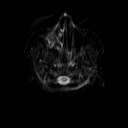
[im 13/103]
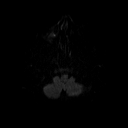
[im 26/103]
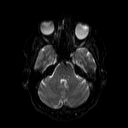
[im 39/103]
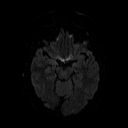
[im 52/103]
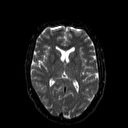
[im 64/103]
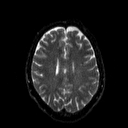
[im 77/103]
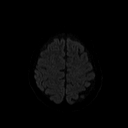
[im 90/103]
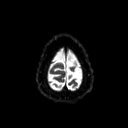
[im 103/103]
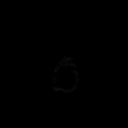

[Series 4: DWI · axial · 3.0mm · 1.80mm/px · z∈[-44,+107]mm · 5 of 51 slices shown (2 of 4)]
[im 1/51]
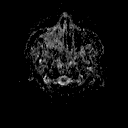
[im 13/51]
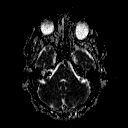
[im 26/51]
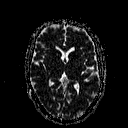
[im 38/51]
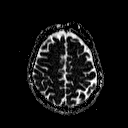
[im 51/51]
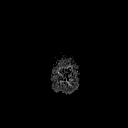

[Series 5: DWI · coronal · 5.0mm · 1.80mm/px · 6 of 72 slices shown (3 of 4)]
[im 1/72]
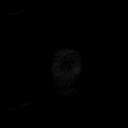
[im 15/72]
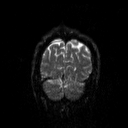
[im 29/72]
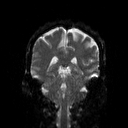
[im 43/72]
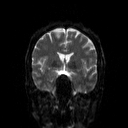
[im 57/72]
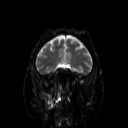
[im 72/72]
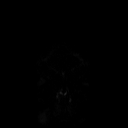

[Series 6: DWI · coronal · 5.0mm · 1.80mm/px · 3 of 36 slices shown (4 of 4)]
[im 1/36]
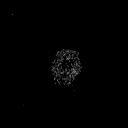
[im 18/36]
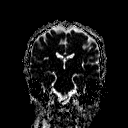
[im 36/36]
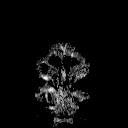

[Series 7: T2 · axial · 5.0mm · 0.51mm/px · z∈[-49,+96]mm · 2 of 22 slices shown (1 of 2)]
[im 1/22]
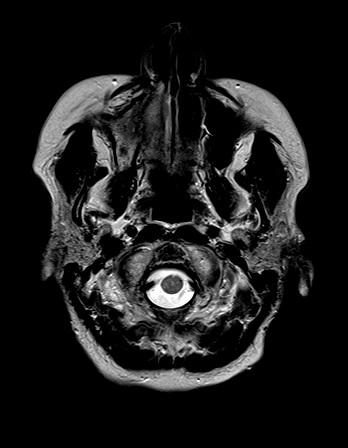
[im 22/22]
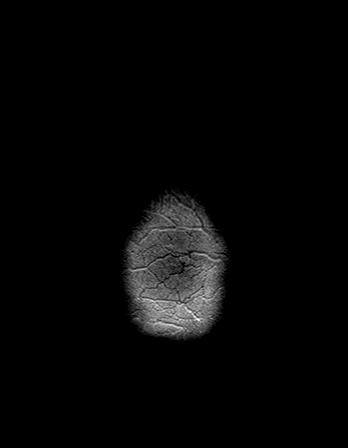

[Series 8: FLAIR · axial · 3.0mm · 0.45mm/px · z∈[-47,+97]mm · 3 of 32 slices shown]
[im 1/32]
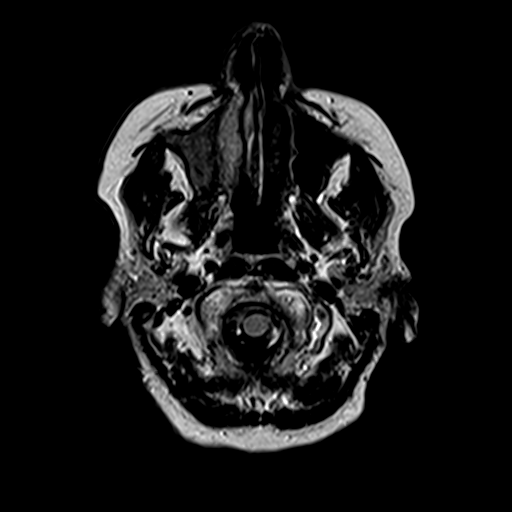
[im 16/32]
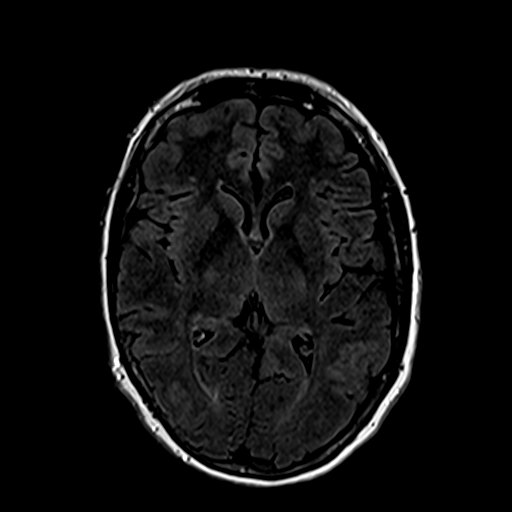
[im 32/32]
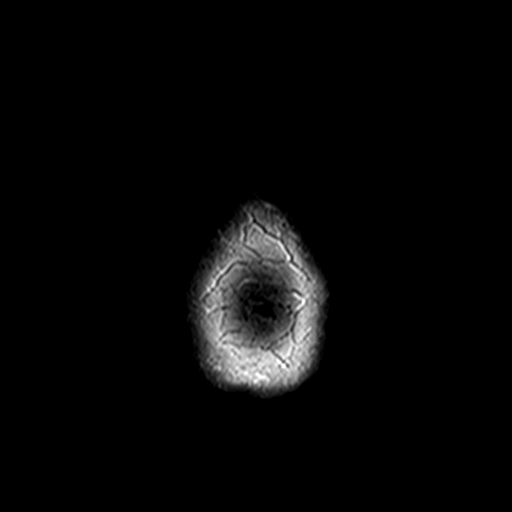

[Series 10: swi_images · axial · 5.0mm · 0.90mm/px · z∈[-47,+106]mm · 3 of 32 slices shown]
[im 1/32]
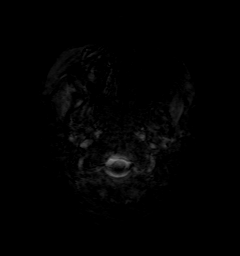
[im 16/32]
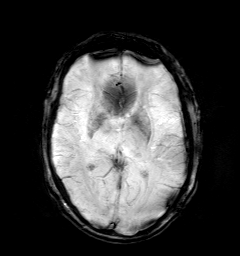
[im 32/32]
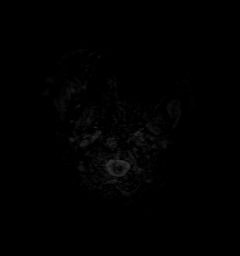

[Series 11: t1_mpr_tra · axial · 1.0mm · 0.71mm/px · z∈[-43,+99]mm · 13 of 144 slices shown]
[im 1/144]
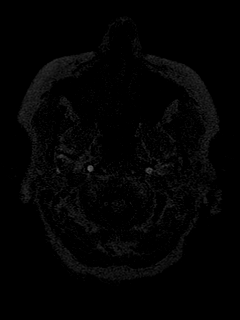
[im 12/144]
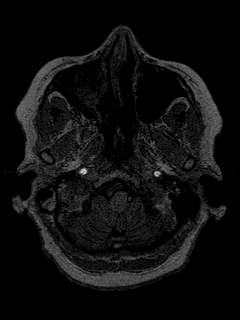
[im 24/144]
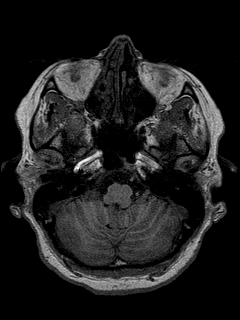
[im 36/144]
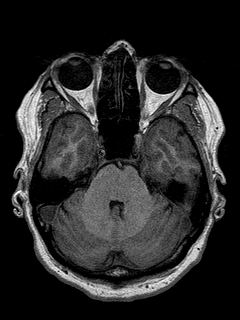
[im 48/144]
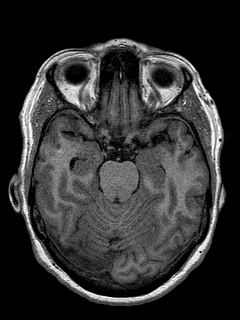
[im 60/144]
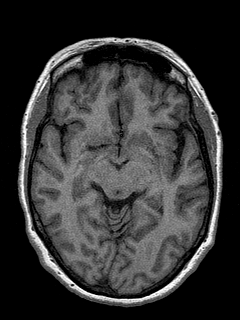
[im 72/144]
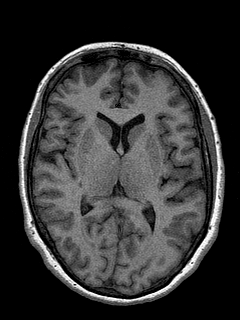
[im 84/144]
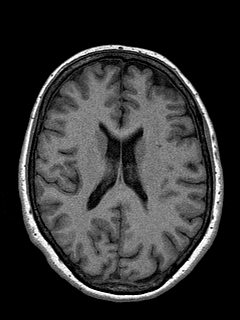
[im 96/144]
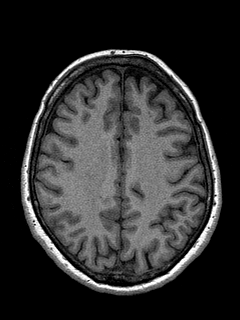
[im 108/144]
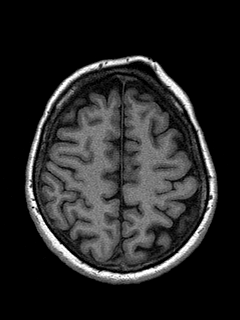
[im 120/144]
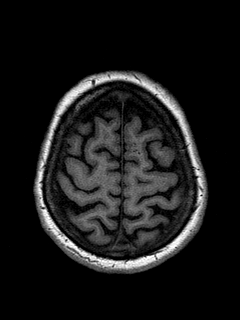
[im 132/144]
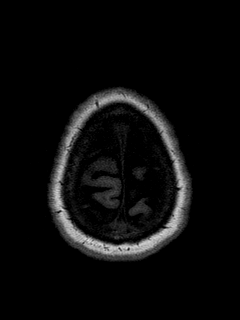
[im 144/144]
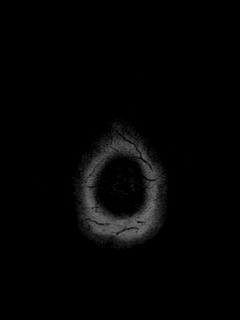

[Series 12: T2 · coronal · 5.0mm · 0.45mm/px · 2 of 28 slices shown (2 of 2)]
[im 1/28]
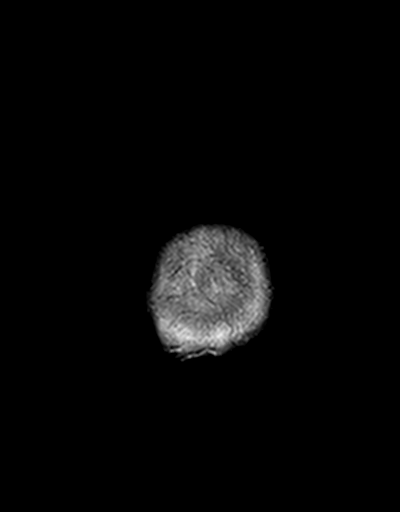
[im 28/28]
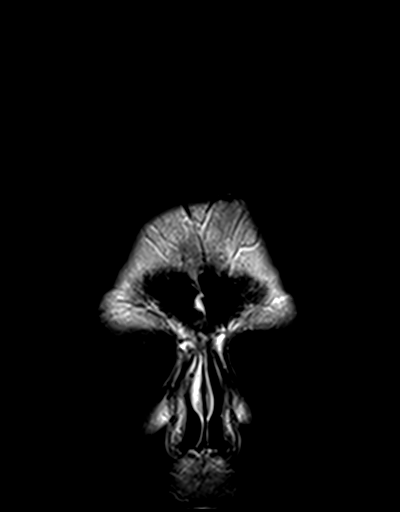

[48 of 48 positions shown; findings below may reference images not displayed]

FINDINGS: Brain: No infarction, hemorrhage, hydrocephalus, extra-axial
collection or mass lesion. 2 or 3 FLAIR hyperintensities in the
cerebral white matter from nonspecific remote insult. No generalized
white matter disease. No atrophy

Vascular: Major flow voids are preserved.

Skull and upper cervical spine: Left frontal bone osteoma.

Sinuses/Orbits: Chronic right maxillary sinusitis with sclerotic
wall thickening, opacification, and atelectasis.
IMPRESSION: 1. No acute finding or explanation for symptoms.
2. Chronic right maxillary sinusitis.

## 2020-05-29 DIAGNOSIS — F320 Major depressive disorder, single episode, mild: Secondary | ICD-10-CM | POA: Diagnosis not present

## 2020-05-29 DIAGNOSIS — G43709 Chronic migraine without aura, not intractable, without status migrainosus: Secondary | ICD-10-CM | POA: Diagnosis not present

## 2020-06-20 DIAGNOSIS — F900 Attention-deficit hyperactivity disorder, predominantly inattentive type: Secondary | ICD-10-CM | POA: Diagnosis not present

## 2020-06-20 DIAGNOSIS — F3342 Major depressive disorder, recurrent, in full remission: Secondary | ICD-10-CM | POA: Diagnosis not present

## 2020-07-02 ENCOUNTER — Encounter: Payer: Self-pay | Admitting: Family Medicine

## 2020-07-23 ENCOUNTER — Other Ambulatory Visit: Payer: Self-pay

## 2020-07-23 DIAGNOSIS — N3941 Urge incontinence: Secondary | ICD-10-CM

## 2020-07-23 MED ORDER — OXYBUTYNIN CHLORIDE ER 10 MG PO TB24: ORAL_TABLET | ORAL | 0 refills | Status: DC

## 2020-07-23 NOTE — Telephone Encounter (Signed)
Medication qued to pharmacy

## 2020-07-28 ENCOUNTER — Other Ambulatory Visit: Payer: Self-pay | Admitting: Family Medicine

## 2020-07-28 DIAGNOSIS — N3941 Urge incontinence: Secondary | ICD-10-CM

## 2020-07-31 ENCOUNTER — Encounter: Payer: Self-pay | Admitting: Family Medicine

## 2020-07-31 DIAGNOSIS — N3941 Urge incontinence: Secondary | ICD-10-CM

## 2020-07-31 MED ORDER — OXYBUTYNIN CHLORIDE ER 10 MG PO TB24: ORAL_TABLET | ORAL | 3 refills | Status: DC

## 2020-08-20 ENCOUNTER — Telehealth: Payer: Self-pay | Admitting: Family Medicine

## 2020-08-20 DIAGNOSIS — N3941 Urge incontinence: Secondary | ICD-10-CM

## 2020-08-20 NOTE — Telephone Encounter (Signed)
PA initiated via Covermymeds; KEY: [id number]. Awaiting determination.

## 2020-08-20 NOTE — Telephone Encounter (Signed)
Pharmacy states insurance requires a prior authorization  oxybutynin (DITROPAN-XL) 10 MG 24 hr tablet    [id number] [address] [id number] Phone:  [phone]:  [phone]

## 2020-08-20 NOTE — Telephone Encounter (Signed)
Working on [place]. See other telephone note.

## 2020-08-21 ENCOUNTER — Other Ambulatory Visit: Payer: Self-pay | Admitting: Family Medicine

## 2020-08-21 DIAGNOSIS — N3941 Urge incontinence: Secondary | ICD-10-CM

## 2020-08-21 NOTE — Telephone Encounter (Signed)
Patient called to follow up on medication  oxybutynin (Ditropan) 43m 24hr tablet

## 2020-08-21 NOTE — Telephone Encounter (Signed)
Awaiting determination- can take several days for response from insurance.

## 2020-08-22 NOTE — Telephone Encounter (Signed)
PA approved.   Effective from 08/20/2020 through 08/19/2021.

## 2020-08-22 NOTE — Telephone Encounter (Signed)
[facility] aware.

## 2020-08-29 DIAGNOSIS — G43009 Migraine without aura, not intractable, without status migrainosus: Secondary | ICD-10-CM | POA: Diagnosis not present

## 2020-08-29 DIAGNOSIS — R42 Dizziness and giddiness: Secondary | ICD-10-CM | POA: Diagnosis not present

## 2020-08-29 DIAGNOSIS — G43709 Chronic migraine without aura, not intractable, without status migrainosus: Secondary | ICD-10-CM | POA: Diagnosis not present

## 2020-08-29 DIAGNOSIS — F320 Major depressive disorder, single episode, mild: Secondary | ICD-10-CM | POA: Diagnosis not present

## 2020-11-08 DIAGNOSIS — H903 Sensorineural hearing loss, bilateral: Secondary | ICD-10-CM | POA: Diagnosis not present

## 2020-11-08 DIAGNOSIS — R42 Dizziness and giddiness: Secondary | ICD-10-CM | POA: Diagnosis not present

## 2020-11-22 DIAGNOSIS — R42 Dizziness and giddiness: Secondary | ICD-10-CM | POA: Diagnosis not present

## 2020-12-05 DIAGNOSIS — H81399 Other peripheral vertigo, unspecified ear: Secondary | ICD-10-CM | POA: Diagnosis not present

## 2020-12-12 DIAGNOSIS — H81399 Other peripheral vertigo, unspecified ear: Secondary | ICD-10-CM | POA: Diagnosis not present

## 2020-12-12 DIAGNOSIS — F3342 Major depressive disorder, recurrent, in full remission: Secondary | ICD-10-CM | POA: Diagnosis not present

## 2020-12-19 DIAGNOSIS — H81399 Other peripheral vertigo, unspecified ear: Secondary | ICD-10-CM | POA: Diagnosis not present

## 2021-01-02 DIAGNOSIS — H81399 Other peripheral vertigo, unspecified ear: Secondary | ICD-10-CM | POA: Diagnosis not present

## 2021-03-20 DIAGNOSIS — M13841 Other specified arthritis, right hand: Secondary | ICD-10-CM | POA: Diagnosis not present

## 2021-03-20 DIAGNOSIS — M13849 Other specified arthritis, unspecified hand: Secondary | ICD-10-CM | POA: Diagnosis not present

## 2021-03-20 DIAGNOSIS — M79641 Pain in right hand: Secondary | ICD-10-CM | POA: Diagnosis not present

## 2021-03-20 DIAGNOSIS — M79644 Pain in right finger(s): Secondary | ICD-10-CM | POA: Diagnosis not present

## 2021-03-25 DIAGNOSIS — G43009 Migraine without aura, not intractable, without status migrainosus: Secondary | ICD-10-CM | POA: Diagnosis not present

## 2021-03-25 DIAGNOSIS — R42 Dizziness and giddiness: Secondary | ICD-10-CM | POA: Diagnosis not present

## 2021-03-27 DIAGNOSIS — Z20822 Contact with and (suspected) exposure to covid-19: Secondary | ICD-10-CM | POA: Diagnosis not present

## 2021-05-19 DIAGNOSIS — F410 Panic disorder [episodic paroxysmal anxiety] without agoraphobia: Secondary | ICD-10-CM | POA: Diagnosis not present

## 2021-05-19 DIAGNOSIS — F900 Attention-deficit hyperactivity disorder, predominantly inattentive type: Secondary | ICD-10-CM | POA: Diagnosis not present

## 2021-09-08 ENCOUNTER — Other Ambulatory Visit: Payer: Self-pay | Admitting: Family Medicine

## 2021-09-08 DIAGNOSIS — N3941 Urge incontinence: Secondary | ICD-10-CM

## 2021-09-17 ENCOUNTER — Encounter: Payer: BC Managed Care – PPO | Admitting: Family Medicine

## 2021-09-18 ENCOUNTER — Other Ambulatory Visit (HOSPITAL_COMMUNITY)
Admission: RE | Admit: 2021-09-18 | Discharge: 2021-09-18 | Disposition: A | Discharge status: Home / Self Care | Payer: BC Managed Care – PPO | Source: Ambulatory Visit | Attending: Family Medicine | Admitting: Family Medicine

## 2021-09-18 ENCOUNTER — Encounter: Payer: Self-pay | Admitting: Family Medicine

## 2021-09-18 ENCOUNTER — Ambulatory Visit (INDEPENDENT_AMBULATORY_CARE_PROVIDER_SITE_OTHER): Payer: BC Managed Care – PPO | Admitting: Family Medicine

## 2021-09-18 VITALS — BP 154/90 | HR 91 | Temp 98.2°F | Resp 18 | Ht 66.0 in | Wt 198.4 lb

## 2021-09-18 DIAGNOSIS — Z124 Encounter for screening for malignant neoplasm of cervix: Secondary | ICD-10-CM | POA: Diagnosis not present

## 2021-09-18 DIAGNOSIS — K51911 Ulcerative colitis, unspecified with rectal bleeding: Secondary | ICD-10-CM

## 2021-09-18 DIAGNOSIS — Z1231 Encounter for screening mammogram for malignant neoplasm of breast: Secondary | ICD-10-CM | POA: Diagnosis not present

## 2021-09-18 DIAGNOSIS — B351 Tinea unguium: Secondary | ICD-10-CM

## 2021-09-18 DIAGNOSIS — Z131 Encounter for screening for diabetes mellitus: Secondary | ICD-10-CM

## 2021-09-18 DIAGNOSIS — Z1329 Encounter for screening for other suspected endocrine disorder: Secondary | ICD-10-CM

## 2021-09-18 DIAGNOSIS — Z13220 Encounter for screening for lipoid disorders: Secondary | ICD-10-CM

## 2021-09-18 DIAGNOSIS — K625 Hemorrhage of anus and rectum: Secondary | ICD-10-CM

## 2021-09-18 DIAGNOSIS — Z130 Encounter for screening for diseases of the blood and blood-forming organs and certain disorders involving the immune mechanism: Secondary | ICD-10-CM | POA: Diagnosis not present

## 2021-09-18 DIAGNOSIS — Z23 Encounter for immunization: Secondary | ICD-10-CM | POA: Diagnosis not present

## 2021-09-18 DIAGNOSIS — R5383 Other fatigue: Secondary | ICD-10-CM | POA: Diagnosis not present

## 2021-09-18 DIAGNOSIS — Z0000 Encounter for general adult medical examination without abnormal findings: Secondary | ICD-10-CM | POA: Diagnosis not present

## 2021-09-18 LAB — CBC
HCT: 40.9 % (ref 36.0–46.0)
Hemoglobin: 13.5 g/dL (ref 12.0–15.0)
MCHC: 33 g/dL (ref 30.0–36.0)
MCV: 92.7 fl (ref 78.0–100.0)
Platelets: 287 10*3/uL (ref 150.0–400.0)
RBC: 4.41 Mil/uL (ref 3.87–5.11)
RDW: 14.4 % (ref 11.5–15.5)
WBC: 5.3 10*3/uL (ref 4.0–10.5)

## 2021-09-18 LAB — COMPREHENSIVE METABOLIC PANEL
ALT: 11 U/L (ref 0–35)
AST: 17 U/L (ref 0–37)
Albumin: 4.4 g/dL (ref 3.5–5.2)
Alkaline Phosphatase: 72 U/L (ref 39–117)
BUN: 14 mg/dL (ref 6–23)
CO2: 23 mEq/L (ref 19–32)
Calcium: 9.4 mg/dL (ref 8.4–10.5)
Chloride: 103 mEq/L (ref 96–112)
Creatinine, Ser: 0.97 mg/dL (ref 0.40–1.20)
GFR: 62.71 mL/min (ref >60.00)
Glucose, Bld: 95 mg/dL (ref 70–99)
Potassium: 4.1 mEq/L (ref 3.5–5.1)
Sodium: 136 mEq/L (ref 135–145)
Total Bilirubin: 0.6 mg/dL (ref 0.2–1.2)
Total Protein: 6.9 g/dL (ref 6.0–8.3)

## 2021-09-18 LAB — TSH: TSH: 1.28 u[IU]/mL (ref 0.35–5.50)

## 2021-09-18 LAB — LIPID PANEL
Cholesterol: 207 mg/dL — ABNORMAL HIGH (ref 0–200)
HDL: 76.9 mg/dL (ref >39.00)
LDL Cholesterol: 117 mg/dL — ABNORMAL HIGH (ref 0–99)
NonHDL: 130.57
Total CHOL/HDL Ratio: 3
Triglycerides: 70 mg/dL (ref 0.0–149.0)
VLDL: 14 mg/dL (ref 0.0–40.0)

## 2021-09-18 LAB — HEMOGLOBIN A1C: Hgb A1c MFr Bld: 5.7 % (ref 4.6–6.5)

## 2021-09-18 LAB — VITAMIN D 25 HYDROXY (VIT D DEFICIENCY, FRACTURES): VITD: 45.48 ng/mL (ref 30.00–100.00)

## 2021-09-18 MED ORDER — TERBINAFINE HCL 250 MG PO TABS: 250.0000 mg | ORAL_TABLET | Freq: Every day | ORAL | 0 refills | Status: DC

## 2021-09-18 NOTE — Progress Notes (Addendum)
[place] at [facility] [address] [place], [place] [address] [phone]  Date:  09/18/2021   Name:  [name]   DOB:  June 23, 1961   MRN:  [id number]  PCP:  Darreld Mclean, MD    Chief Complaint: Annual Exam (Concerns/ questions: 1. pt does not think the Oxybutynin is working as it should. 2. She thinks she might have a Hemorid. 3. Pt says she needs her Handicap form signed d/t her knee. /Flu shot today: decline/Mammogram/ pap: no longer seeing GYN/Zoster: due/HIV screen due)   History of Present Illness:  [name] is a 61 y.o. very pleasant female patient who presents with the following:  Pt seen today for annual exam/ physical-she also has several other concerns.  I have not seen her in some time Last seen by myself for a sick visit in 07/2019 From our last physical in 1/19: History of ulcerative colitis, GERD, migraine, depression She lives in [place] She has been a Regulatory affairs officer for many years   She gets periodic botox injections for her migraine headaches- [facility],she has gone there for years  She does use topamax for headaches, but does not know how much this is helping her at this time.  She is thinking of stopping it as it seems to cause memory isues   Mammogram- last done in 2019- diagnostic was recommended but she did not yet follow-up.  She is willing to proceed with evaluation, I will order necessary testing Pap- can update today  Flu vaccine- declines  Shingrix she is interested in starting this, give first dose today Covid series -recommended Needs labs today   Discussed history of ulcerative colitis She is having intermittent rectal bleeding-she has certain bleeding is not vaginal.  She thinks she is likely due for a colonoscopy She does not have a current GI doctor  She is now seeing [facility] for her HA- she notes that  Topamax seemed to be affecting her memory She had been using emgality more recently with success  She is still on a low dose of topamax - she does feel better on this lower dose.She was also able to stop botox   She is taking adderall now- thinks this may be why her BP is high Her BP has not been elevated in the past that we are aware She does not check blood pressure at home  Patient notes she has had a delay knee replacement surgery due to other issues.  She requests a handicap parking form  BP Readings from Last 3 Encounters:  09/18/21 (!) 154/90  06/15/18 119/81  06/08/18 138/80    Patient Active Problem List   Diagnosis Date Noted   OA (osteoarthritis) of knee 10/25/2017   Ulcerative colitis, unspecified  see GI note Dr. Valli Glance 02/2007 05/13/2014   Post-menopausal bleeding  evalutated  Dr. Lynnette Caffey 03/13/2014   Blood in stool 03/13/2014   Fibroadenoma of left breast 04/10/2013   ANA positive 03/20/2013   Elevated transaminase measurement 03/15/2013   Vaginal spotting 12/08/2012   Abnormal Pap smear 10/13/2011   Cardiac murmur 10/13/2011   GERD (gastroesophageal reflux disease) 10/13/2011   Urticaria 10/13/2011   Arthritis    Migraine    Depression    Menopausal syndrome     Past Medical History:  Diagnosis Date   Arthritis    Right  Shoulder, Feet, Fingers, Back, Neck   Bleeding nose    History of    Depression    Frequent urination    GERD (gastroesophageal reflux disease)    Gestational diabetes    Heart murmur    echo 10/20/2011   Hole in the ear drum, right    Hyperlipidemia    Minimal   Menopausal syndrome    Migraine without aura    Botox injection 10/21/2017   OA (osteoarthritis) of knee    Obese    Plantar warts    PMB (postmenopausal bleeding)    Pneumonia    history of    PONV (postoperative nausea and vomiting)    Postop Acute blood loss anemia 11/05/2011   TIA (transient ischemic attack) 06/05/2018   Ulcerative colitis ([place])    Urgency of  urination    Vertigo     Past Surgical History:  Procedure Laterality Date   BREAST BIOPSY Left    COLONOSCOPY     ear drum patched Right    KNEE ARTHROSCOPY  2005   MOUTH SURGERY     gum surgery   skull fracture surgery     calcinatied bone from [place]  11/02/2011   Procedure: TOTAL KNEE ARTHROPLASTY;  Surgeon: [name], MD;  Location: [facility];  Service: Orthopedics;  Laterality: Left;   TOTAL KNEE ARTHROPLASTY Right 10/25/2017   Procedure: RIGHT TOTAL KNEE ARTHROPLASTY;  Surgeon: Gaynelle Arabian, MD;  Location: [facility];  Service: Orthopedics;  Laterality: Right;    Social History   Tobacco Use   Smoking status: Never   Smokeless tobacco: Never  Vaping Use   Vaping Use: Never used  Substance Use Topics   Alcohol use: Yes    Comment: occassionally   Drug use: No    Family History  Problem Relation Age of Onset   Lung cancer Father    Thyroid disease Father    Stroke Father    Heart disease Mother        mitral valve prolapse   Thyroid disease Sister    Heart disease Maternal Aunt    Heart disease Maternal Uncle    Thyroid disease Paternal Aunt    Heart disease Maternal Grandfather    Heart disease Paternal Grandfather    Heart disease Maternal Uncle    Heart disease Maternal Uncle    Heart disease Maternal Uncle    Diabetes Maternal Aunt    Heart disease Brother     Allergies  Allergen Reactions   Codeine Nausea And Vomiting   Onion Other (See Comments)    Headache     Medication list has been reviewed and updated.  Current Outpatient Medications on File Prior to Visit  Medication Sig Dispense Refill   aspirin EC 81 MG tablet Take 1 tablet (81 mg total) by mouth daily.     cetirizine (ZYRTEC) 10 MG tablet Take 10 mg by mouth daily.     fexofenadine (ALLEGRA) 180 MG tablet Take 180 mg by mouth daily.     meclizine (ANTIVERT) 12.5 MG tablet Take 1 tablet (12.5 mg total) by mouth 3 (three) times daily  as needed for dizziness. 30 tablet 0   omeprazole (PRILOSEC OTC) 20 MG tablet Take 20 mg by mouth daily.     ondansetron (ZOFRAN) 4 MG tablet Take 1 tablet (4 mg total) by mouth every 8 (eight) hours as needed for nausea or vomiting. 20 tablet 0  oxybutynin (DITROPAN-XL) 10 MG 24 hr tablet TAKE 1 TABLET IN THE MORNING AND 2 TABLETS IN THE EVENING 270 tablet 3   topiramate (TOPAMAX) 200 MG tablet Take 400 mg by mouth at bedtime.     venlafaxine (EFFEXOR-XR) 75 MG 24 hr capsule Take 150 mg by mouth daily after breakfast.      No current facility-administered medications on file prior to visit.    Review of Systems:  As per HPI- otherwise negative.   Physical Examination: Vitals:   09/18/21 0917 09/18/21 0954  BP: (!) 154/100 (!) 154/90  Pulse: (!) 105 91  Resp: 18   Temp: 98.2 F (36.8 C)   SpO2: 95%    Vitals:   09/18/21 0917  Weight: 198 lb 6.4 oz (90 kg)  Height: 5\' 6"  (1.676 m)   Body mass index is 32.02 kg/m. Ideal Body Weight: Weight in (lb) to have BMI = 25: 154.6  GEN: no acute distress.  Obese, otherwise looks well HEENT: Atraumatic, Normocephalic. Bilateral TM wnl, oropharynx normal.  PEERL,EOMI.   Ears and Nose: No external deformity. CV: RRR, No M/G/R. No JVD. No thrill. No extra heart sounds. PULM: CTA B, no wheezes, crackles, rhonchi. No retractions. No resp. distress. No accessory muscle use. ABD: S, NT, ND, +BS. No rebound. No HSM. EXTR: No c/c/e PSYCH: Normally interactive. Conversant.  Patient is quite talkative, seems somewhat anxious Onychomycosis in her left toes  Pap smear collected today, normal genitals, vagina, cervix normal  Assessment and Plan: Physical exam  Encounter for screening mammogram for malignant neoplasm of breast - Plan: MM DIAG BREAST TOMO UNI RIGHT, US BREAST COMPLETE UNI RIGHT INC AXILLA, MS DIGITAL SCREENING TOMO UNI LEFT  Screening for cervical cancer - Plan: Cytology - PAP  Screening, deficiency anemia, iron - Plan:  CBC  Screening for diabetes mellitus - Plan: Comprehensive metabolic panel, Hemoglobin A1c  Screening for hyperlipidemia - Plan: Lipid panel  Screening for thyroid disorder - Plan: TSH  Fatigue, unspecified type - Plan: TSH, VITAMIN D 25 Hydroxy (Vit-D Deficiency, Fractures)  Rectal bleeding - Plan: Ambulatory referral to Gastroenterology  Ulcerative colitis with rectal bleeding, unspecified location [facility]) - Plan: Ambulatory referral to Gastroenterology  Onychomycosis  Immunization due - Plan: Varicella-zoster vaccine IM (Shingrix) Patient seen today for physical exam as well as several other concerns Given first dose of Shingrix today  She is concerned about toenail fungus Will treat with terbinafine assuming her LFTs are ok- she has not had labs in a long time Elevated BP today-patient is admitted for anxiety and Adderall use.  She will try to get a blood pressure cuff so she can monitor at home.  I asked her to see me in 2 to 3 months, we can recheck her blood pressure and get second dose of Shingrix at that time  She notes pain in her thumb from OA- she is seeing ortho She takes tylenol and it does help -occasionally uses Aleve.  She wonders about going back on Celebrex.  I advised before we start on a regular NSAID we need to follow-up on her blood pressure  Routine labs are pending as above Encouraged healthy diet and exercise routine Pap collected, mammogram ordered, referral to GI placed  Extra time needed for visit today, spent approximately 45 minutes face-to-face with patient    Signed Lamar Blinks, MD  Received patient labs as below, message to patient  Results for orders placed or performed in visit on 09/18/21  CBC  Result Value Ref  Range   WBC 5.3 4.0 - 10.5 K/uL   RBC 4.41 3.87 - 5.11 Mil/uL   Platelets 287.0 150.0 - 400.0 K/uL   Hemoglobin 13.5 12.0 - 15.0 g/dL   HCT 40.9 36.0 - 46.0 %   MCV 92.7 78.0 - 100.0 fl   MCHC 33.0 30.0 - 36.0 g/dL   RDW  14.4 11.5 - 15.5 %  Comprehensive metabolic panel  Result Value Ref Range   Sodium 136 135 - 145 mEq/L   Potassium 4.1 3.5 - 5.1 mEq/L   Chloride 103 96 - 112 mEq/L   CO2 23 19 - 32 mEq/L   Glucose, Bld 95 70 - 99 mg/dL   BUN 14 6 - 23 mg/dL   Creatinine, Ser 0.97 0.40 - 1.20 mg/dL   Total Bilirubin 0.6 0.2 - 1.2 mg/dL   Alkaline Phosphatase 72 39 - 117 U/L   AST 17 0 - 37 U/L   ALT 11 0 - 35 U/L   Total Protein 6.9 6.0 - 8.3 g/dL   Albumin 4.4 3.5 - 5.2 g/dL   GFR 62.71 >60.00 mL/min   Calcium 9.4 8.4 - 10.5 mg/dL  Hemoglobin A1c  Result Value Ref Range   Hgb A1c MFr Bld 5.7 4.6 - 6.5 %  Lipid panel  Result Value Ref Range   Cholesterol 207 (H) 0 - 200 mg/dL   Triglycerides 70.0 0.0 - 149.0 mg/dL   HDL 76.90 >39.00 mg/dL   VLDL 14.0 0.0 - 40.0 mg/dL   LDL Cholesterol 117 (H) 0 - 99 mg/dL   Total CHOL/HDL Ratio 3    NonHDL 130.57   TSH  Result Value Ref Range   TSH 1.28 0.35 - 5.50 uIU/mL  VITAMIN D 25 Hydroxy (Vit-D Deficiency, Fractures)  Result Value Ref Range   VITD 45.48 30.00 - 100.00 ng/mL

## 2021-09-18 NOTE — Patient Instructions (Addendum)
It was good to see you again today Please go to the lab for blood draw- I will be in touch with your results First dose of Shingrix given today- please schedule your next shot in 2-6 months, nurse visit ok This vaccine may cause aches and fatigue for a day or so- this is normal!  Referral placed to GI to establish care and do colonoscopy Pap is pending I ordered your mammogram per the [facility]   I recommend getting the covid 19 series if not done yet

## 2021-09-18 NOTE — Addendum Note (Signed)
Addended by: Lamar Blinks C on: 09/18/2021 06:46 PM   Modules accepted: Level of Service

## 2021-09-20 ENCOUNTER — Other Ambulatory Visit: Payer: Self-pay | Admitting: Family Medicine

## 2021-09-20 DIAGNOSIS — Z1231 Encounter for screening mammogram for malignant neoplasm of breast: Secondary | ICD-10-CM

## 2021-09-20 DIAGNOSIS — R928 Other abnormal and inconclusive findings on diagnostic imaging of breast: Secondary | ICD-10-CM

## 2021-09-22 ENCOUNTER — Encounter: Payer: Self-pay | Admitting: Family Medicine

## 2021-09-22 DIAGNOSIS — B351 Tinea unguium: Secondary | ICD-10-CM

## 2021-09-22 LAB — CYTOLOGY - PAP
Comment: NEGATIVE
Diagnosis: NEGATIVE
High risk HPV: NEGATIVE

## 2021-09-30 MED ORDER — TERBINAFINE HCL 250 MG PO TABS: 250.0000 mg | ORAL_TABLET | Freq: Every day | ORAL | 0 refills | Status: DC

## 2021-09-30 NOTE — Addendum Note (Signed)
Addended byDamita Dunnings D on: 09/30/2021 04:28 PM   Modules accepted: Orders

## 2021-10-07 DIAGNOSIS — G43009 Migraine without aura, not intractable, without status migrainosus: Secondary | ICD-10-CM | POA: Diagnosis not present

## 2021-10-07 DIAGNOSIS — R42 Dizziness and giddiness: Secondary | ICD-10-CM | POA: Diagnosis not present

## 2021-11-03 DIAGNOSIS — F411 Generalized anxiety disorder: Secondary | ICD-10-CM | POA: Diagnosis not present

## 2021-11-03 DIAGNOSIS — F900 Attention-deficit hyperactivity disorder, predominantly inattentive type: Secondary | ICD-10-CM | POA: Diagnosis not present

## 2021-11-03 DIAGNOSIS — F4321 Adjustment disorder with depressed mood: Secondary | ICD-10-CM | POA: Diagnosis not present

## 2021-11-10 ENCOUNTER — Encounter: Payer: Self-pay | Admitting: Family Medicine

## 2021-11-10 DIAGNOSIS — I10 Essential (primary) hypertension: Secondary | ICD-10-CM

## 2021-11-10 MED ORDER — LISINOPRIL 10 MG PO TABS: 10.0000 mg | ORAL_TABLET | Freq: Every day | ORAL | 3 refills | Status: DC

## 2021-11-10 NOTE — Addendum Note (Signed)
Addended by: Lamar Blinks C on: 11/10/2021 08:17 PM ? ? Modules accepted: Orders ? ?

## 2021-11-11 ENCOUNTER — Encounter: Payer: Self-pay | Admitting: Family Medicine

## 2021-11-14 NOTE — Progress Notes (Deleted)
Therapist, music at [facility] ?[address], [address] ?[place], [place] [address] ?336 [phone] ?Fax [phone] ? ?Date:  11/17/2021  ? ?Name:  [name]   DOB:  [birth year]   MRN:  [id number] ? ?PCP:  Darreld Mclean, MD  ? ? ?Chief Complaint: No chief complaint on file. ? ? ?History of Present Illness: ? ?[name] is a 61 y.o. very pleasant female patient who presents with the following: ? ?Patient seen today for short-term follow-up ?Most recent visit with myself was in January for physical; at that time I have not seen her since 2020 and she had several concerns so we made a short-term follow-up appointment ? ?Can give second dose of Shingrix today ?At our last visit she noted ulcerative colitis with bleeding, did not have a gastroenterologist.  Referral made to GI ?I ordered her mammogram at last visit, not completed yet-she had an abnormal mammogram in 2019, needs follow-up.  I ordered a diagnostic mammogram and right breast ultrasound in January ?Labs done in January-CMP, lipid, vitamin D, CBC, A1c, thyroid ? ?She has been seen by [facility]-most recent visit in February ?1. Migraine without aura and without status migrainosus, not intractable (Primary) ?- Ubrogepant (UBRELVY) 100 MG TABS; Take 100 mg by mouth as needed. Ubrelvy 100 mg Take 1 pill at onset of moderate-severe headache. May repeat after 2 hours as needed. Max 2/24h or 200 mg in 24 hours., Starting Tue 10/07/2021, Normal ?- topiramate (TROKENDI XR) 50 MG ER capsule; TAKE 1 CAPSULE BY MOUTH EVERY DAY . TAKE WITH 100MG CAPS FOR TOTAL DOSE OF 150MG, Normal ?2. Vertigo ? ?Continue Mg/B2 ?Continue Trokendi-50 mg daily ?Continue Emgality-very beneficial ?Continue Effexor as prescribed ?Continue Ubrelvy 100 mg as needed ?Baclofen prn ?Phenergan as needed-has not needed in a while ? ?Patient Active Problem List  ? Diagnosis Date Noted  ? OA (osteoarthritis) of knee 10/25/2017  ? Ulcerative colitis,  unspecified  see GI note Dr. Valli Glance 02/2007 05/13/2014  ? Post-menopausal bleeding  evalutated  Dr. Lynnette Caffey 03/13/2014  ? Blood in stool 03/13/2014  ? Fibroadenoma of left breast 04/10/2013  ? ANA positive 03/20/2013  ? Elevated transaminase measurement 03/15/2013  ? Vaginal spotting 12/08/2012  ? Abnormal Pap smear 10/13/2011  ? Cardiac murmur 10/13/2011  ? GERD (gastroesophageal reflux disease) 10/13/2011  ? Urticaria 10/13/2011  ? Arthritis   ? Migraine   ? Depression   ? Menopausal syndrome   ? ? ?Past Medical History:  ?Diagnosis Date  ? Arthritis   ? Right Shoulder, Feet, Fingers, Back, Neck  ? Bleeding nose   ? History of   ? Depression   ? Frequent urination   ? GERD (gastroesophageal reflux disease)   ? Gestational diabetes   ? Heart murmur   ? echo 10/20/2011  ? Hole in the ear drum, right   ? Hyperlipidemia   ? Minimal  ? Menopausal syndrome   ? Migraine without aura   ? Botox injection 10/21/2017  ? OA (osteoarthritis) of knee   ? Obese   ? Plantar warts   ? PMB (postmenopausal bleeding)   ? Pneumonia   ? history of   ? PONV (postoperative nausea and vomiting)   ? Postop Acute blood loss anemia 11/05/2011  ? TIA (transient ischemic attack) 06/05/2018  ? Ulcerative colitis ([place])   ? Urgency of urination   ? Vertigo   ? ? ?Past Surgical History:  ?Procedure Laterality Date  ?  BREAST BIOPSY Left   ? COLONOSCOPY    ? ear drum patched Right   ? KNEE ARTHROSCOPY  2005  ? MOUTH SURGERY    ? gum surgery  ? skull fracture surgery    ? 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 TONSILLECTOMY  1979  ? TOTAL KNEE ARTHROPLASTY  11/02/2011  ? Procedure: TOTAL KNEE ARTHROPLASTY;  Surgeon: [name], MD;  Location: [facility];  Service: Orthopedics;  Laterality: Left;  ? TOTAL KNEE ARTHROPLASTY Right 10/25/2017  ? Procedure: RIGHT TOTAL KNEE ARTHROPLASTY;  Surgeon: Gaynelle Arabian, MD;  Location: [facility];  Service: Orthopedics;  Laterality: Right;  ? ? ?Social History  ? ?Tobacco Use  ? Smoking status: Never  ? Smokeless tobacco:  Never  ?Vaping Use  ? Vaping Use: Never used  ?Substance Use Topics  ? Alcohol use: Yes  ?  Comment: occassionally  ? Drug use: No  ? ? ?Family History  ?Problem Relation Age of Onset  ? Lung cancer Father   ? Thyroid disease Father   ? Stroke Father   ? Heart disease Mother   ?     mitral valve prolapse  ? Thyroid disease Sister   ? Heart disease Maternal Aunt   ? Heart disease Maternal Uncle   ? Thyroid disease Paternal Aunt   ? Heart disease Maternal Grandfather   ? Heart disease Paternal Grandfather   ? Heart disease Maternal Uncle   ? Heart disease Maternal Uncle   ? Heart disease Maternal Uncle   ? Diabetes Maternal Aunt   ? Heart disease Brother   ? ? ?Allergies  ?Allergen Reactions  ? Codeine Nausea And Vomiting  ? Onion Other (See Comments)  ?  Headache ?  ? ? ?Medication list has been reviewed and updated. ? ?Current Outpatient Medications on File Prior to Visit  ?Medication Sig Dispense Refill  ? aspirin EC 81 MG tablet Take 1 tablet (81 mg total) by mouth daily.    ? cetirizine (ZYRTEC) 10 MG tablet Take 10 mg by mouth daily.    ? fexofenadine (ALLEGRA) 180 MG tablet Take 180 mg by mouth daily.    ? lisinopril (ZESTRIL) 10 MG tablet Take 1 tablet (10 mg total) by mouth daily. 90 tablet 3  ? meclizine (ANTIVERT) 12.5 MG tablet Take 1 tablet (12.5 mg total) by mouth 3 (three) times daily as needed for dizziness. 30 tablet 0  ? omeprazole (PRILOSEC OTC) 20 MG tablet Take 20 mg by mouth daily.    ? ondansetron (ZOFRAN) 4 MG tablet Take 1 tablet (4 mg total) by mouth every 8 (eight) hours as needed for nausea or vomiting. 20 tablet 0  ? oxybutynin (DITROPAN-XL) 10 MG 24 hr tablet TAKE 1 TABLET IN THE MORNING AND 2 TABLETS IN THE EVENING 270 tablet 3  ? terbinafine (LAMISIL) 250 MG tablet Take 1 tablet (250 mg total) by mouth daily. 90 tablet 0  ? topiramate (TOPAMAX) 200 MG tablet Take 400 mg by mouth at bedtime.    ? venlafaxine (EFFEXOR-XR) 75 MG 24 hr capsule Take 150 mg by mouth daily after breakfast.      ? ?No current facility-administered medications on file prior to visit.  ? ? ?Review of Systems: ? ?As per HPI- otherwise negative. ? ? ? ?Physical Examination: ?There were no vitals filed for this visit. ?There were no vitals filed for this visit. ?There is no height or weight on file to calculate BMI. ?Ideal Body Weight:   ? ?GEN:  no acute distress. ?HEENT: Atraumatic, Normocephalic.  ?Ears and Nose: No external deformity. ?CV: RRR, No M/G/R. No JVD. No thrill. No extra heart sounds. ?PULM: CTA B, no wheezes, crackles, rhonchi. No retractions. No resp. distress. No accessory muscle use. ?ABD: S, NT, ND, +BS. No rebound. No HSM. ?EXTR: No c/c/e ?PSYCH: Normally interactive. Conversant.  ? ? ?Assessment and Plan: ?*** ? ?Signed ?Lamar Blinks, MD ? ?

## 2021-11-17 ENCOUNTER — Ambulatory Visit: Payer: BC Managed Care – PPO | Admitting: Family Medicine

## 2021-11-19 NOTE — Progress Notes (Signed)
Therapist, music at [facility] ?[address], [address] ?[place], [place] [address] ?336 [phone] ?Fax [phone] ? ?Date:  11/24/2021  ? ?Name:  [name]   DOB:  1961-02-25   MRN:  [id number] ? ?PCP:  Darreld Mclean, MD  ? ? ?Chief Complaint: Follow-up (Concerns/ questions: 1. Needs Shingles dose #2. 2. Pt says her heart burn is worse, seeing GI tomorrow. ) ? ? ?History of Present Illness: ? ?[name] is a 61 y.o. very pleasant female patient who presents with the following: ? ?Patient seen today for short-term follow-up and second shingles vaccine. ?History of ulcerative colitis, GERD, migraine, depression ?Most recent visit with myself was in January; at that time I had not seen her in a couple of years and she had many concerns so I asked her to make another follow-up appointment ? ?She is a patient at [facility] ? ?Can get second dose Shingrix today ?Labs in January, looked okay ? ? ?She had covid a couple of year ago- she is not quite sure of the date  ?COVID-19 series- she has not had shots so far, encouraged her to do so ?Mammogram ordered-still not scheduled- reminded her again to do this  ? ?Since her last visit she contacted me with blood pressure elevation.  We added lisinopril 10 mg to her regimen and appears she has responded nicely ? ?BP Readings from Last 3 Encounters:  ?11/24/21 126/80  ?09/18/21 (!) 154/90  ?06/15/18 119/81  ? ?She is checking her BP at home but is not quite sure what she is running ?She does note improvement of her HA with improvement of her BP  ? ?She notes difficulty losing weight  ?She is seeing her GI tomorrow for GERD and epigastric pain ?She is afraid she might have an ulcer  ? ?She also notes a chronic daily cough which is often productive of phlegm for about 1 year ?She has never been a smoker ago she was exposed to cigarette smoke as a child ? ?Patient Active Problem List  ? Diagnosis Date Noted  ? OA  (osteoarthritis) of knee 10/25/2017  ? Ulcerative colitis, unspecified  see GI note Dr. Valli Glance 02/2007 05/13/2014  ? Post-menopausal bleeding  evalutated  Dr. Lynnette Caffey 03/13/2014  ? Blood in stool 03/13/2014  ? Fibroadenoma of left breast 04/10/2013  ? ANA positive 03/20/2013  ? Elevated transaminase measurement 03/15/2013  ? Vaginal spotting 12/08/2012  ? Abnormal Pap smear 10/13/2011  ? Cardiac murmur 10/13/2011  ? GERD (gastroesophageal reflux disease) 10/13/2011  ? Urticaria 10/13/2011  ? Arthritis   ? Migraine   ? Depression   ? Menopausal syndrome   ? ? ?Past Medical History:  ?Diagnosis Date  ? Arthritis   ? Right Shoulder, Feet, Fingers, Back, Neck  ? Bleeding nose   ? History of   ? Depression   ? Frequent urination   ? GERD (gastroesophageal reflux disease)   ? Gestational diabetes   ? Heart murmur   ? echo 10/20/2011  ? Hole in the ear drum, right   ? Hyperlipidemia   ? Minimal  ? Menopausal syndrome   ? Migraine without aura   ? Botox injection 10/21/2017  ? OA (osteoarthritis) of knee   ? Obese   ? Plantar warts   ? PMB (postmenopausal bleeding)   ? Pneumonia   ? history of   ? PONV (postoperative nausea and vomiting)   ? Postop Acute  blood loss anemia 11/05/2011  ? TIA (transient ischemic attack) 06/05/2018  ? Ulcerative colitis ([place])   ? Urgency of urination   ? Vertigo   ? ? ?Past Surgical History:  ?Procedure Laterality Date  ? BREAST BIOPSY Left   ? COLONOSCOPY    ? ear drum patched Right   ? KNEE ARTHROSCOPY  2005  ? MOUTH SURGERY    ? gum surgery  ? skull fracture surgery    ? 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 TONSILLECTOMY  1979  ? TOTAL KNEE ARTHROPLASTY  11/02/2011  ? Procedure: TOTAL KNEE ARTHROPLASTY;  Surgeon: [name], MD;  Location: [facility];  Service: Orthopedics;  Laterality: Left;  ? TOTAL KNEE ARTHROPLASTY Right 10/25/2017  ? Procedure: RIGHT TOTAL KNEE ARTHROPLASTY;  Surgeon: Gaynelle Arabian, MD;  Location: [facility];  Service: Orthopedics;  Laterality: Right;  ? ? ?Social History   ? ?Tobacco Use  ? Smoking status: Never  ? Smokeless tobacco: Never  ?Vaping Use  ? Vaping Use: Never used  ?Substance Use Topics  ? Alcohol use: Yes  ?  Comment: occassionally  ? Drug use: No  ? ? ?Family History  ?Problem Relation Age of Onset  ? Lung cancer Father   ? Thyroid disease Father   ? Stroke Father   ? Heart disease Mother   ?     mitral valve prolapse  ? Thyroid disease Sister   ? Heart disease Maternal Aunt   ? Heart disease Maternal Uncle   ? Thyroid disease Paternal Aunt   ? Heart disease Maternal Grandfather   ? Heart disease Paternal Grandfather   ? Heart disease Maternal Uncle   ? Heart disease Maternal Uncle   ? Heart disease Maternal Uncle   ? Diabetes Maternal Aunt   ? Heart disease Brother   ? ? ?Allergies  ?Allergen Reactions  ? Codeine Nausea And Vomiting  ? Onion Other (See Comments)  ?  Headache ?  ? ? ?Medication list has been reviewed and updated. ? ?Current Outpatient Medications on File Prior to Visit  ?Medication Sig Dispense Refill  ? aspirin EC 81 MG tablet Take 1 tablet (81 mg total) by mouth daily.    ? cetirizine (ZYRTEC) 10 MG tablet Take 10 mg by mouth daily.    ? fexofenadine (ALLEGRA) 180 MG tablet Take 180 mg by mouth daily.    ? lisinopril (ZESTRIL) 10 MG tablet Take 1 tablet (10 mg total) by mouth daily. 90 tablet 3  ? meclizine (ANTIVERT) 12.5 MG tablet Take 1 tablet (12.5 mg total) by mouth 3 (three) times daily as needed for dizziness. 30 tablet 0  ? omeprazole (PRILOSEC OTC) 20 MG tablet Take 20 mg by mouth daily.    ? ondansetron (ZOFRAN) 4 MG tablet Take 1 tablet (4 mg total) by mouth every 8 (eight) hours as needed for nausea or vomiting. 20 tablet 0  ? oxybutynin (DITROPAN-XL) 10 MG 24 hr tablet TAKE 1 TABLET IN THE MORNING AND 2 TABLETS IN THE EVENING 270 tablet 3  ? terbinafine (LAMISIL) 250 MG tablet Take 1 tablet (250 mg total) by mouth daily. 90 tablet 0  ? topiramate (TOPAMAX) 200 MG tablet Take 400 mg by mouth at bedtime.    ? venlafaxine (EFFEXOR-XR) 75  MG 24 hr capsule Take 150 mg by mouth daily after breakfast.     ? ?No current facility-administered medications on file prior to visit.  ? ? ?Review of Systems: ? ?As per HPI- otherwise negative. ? ? ?  Physical Examination: ?Vitals:  ? 11/24/21 1317  ?BP: 126/80  ?Pulse: 71  ?Resp: 18  ?Temp: 98 ?F (36.7 ?C)  ?SpO2: 98%  ? ?Vitals:  ? 11/24/21 1317  ?Weight: 201 lb 12.8 oz (91.5 kg)  ?Height: 5' 6"  (1.676 m)  ? ?Body mass index is 32.57 kg/m?. ?Ideal Body Weight: Weight in (lb) to have BMI = 25: 154.6 ? ?GEN: no acute distress.  Obese, looks well ?HEENT: Atraumatic, Normocephalic.  Bilateral TM wnl, oropharynx normal.  PEERL,EOMI.   ?Ears and Nose: No external deformity. ?CV: RRR, No M/G/R. No JVD. No thrill. No extra heart sounds. ?PULM: CTA B, no wheezes, crackles, rhonchi. No retractions. No resp. distress. No accessory muscle use. ?ABD: S, NT, ND, +BS. No rebound. No HSM. ?EXTR: No c/c/e ?PSYCH: Normally interactive. Conversant.  ? ? ?Assessment and Plan: ?Essential hypertension ? ?Immunization due - Plan: Varicella-zoster vaccine IM (Shingrix) ? ?Chronic cough - Plan: DG Chest 2 View ? ?PND (post-nasal drip) - Plan: ipratropium (ATROVENT) 0.03 % nasal spray ?Patient seen today for follow-up ?Blood pressure looks good on current medications.  Continue lisinopril 10 ?Try Atrovent nasal for postnasal drainage ?Chest x-ray for chronic cough ?Second Shingrix ?Signed ?Lamar Blinks, MD ? ?Received her chest x-ray as follows, message to patient ? ?DG Chest 2 View ? ?Result Date: 11/24/2021 ?CLINICAL DATA:  Chronic cough. EXAM: CHEST - 2 VIEW COMPARISON:  10/23/2011. FINDINGS: Trachea is midline. Heart size normal. Minimal biapical pleuroparenchymal scarring. Lungs are hyperinflated but clear. IMPRESSION: Hyperinflation without acute finding. Electronically Signed   By: Lorin Picket M.D.   On: 11/24/2021 14:11   ? ? ?

## 2021-11-24 ENCOUNTER — Ambulatory Visit (HOSPITAL_BASED_OUTPATIENT_CLINIC_OR_DEPARTMENT_OTHER)
Admission: RE | Admit: 2021-11-24 | Discharge: 2021-11-24 | Disposition: A | Discharge status: Home / Self Care | Payer: BC Managed Care – PPO | Source: Ambulatory Visit | Attending: Family Medicine | Admitting: Family Medicine

## 2021-11-24 ENCOUNTER — Ambulatory Visit: Payer: BC Managed Care – PPO | Admitting: Family Medicine

## 2021-11-24 ENCOUNTER — Encounter: Payer: Self-pay | Admitting: Family Medicine

## 2021-11-24 VITALS — BP 126/80 | HR 71 | Temp 98.0°F | Resp 18 | Ht 66.0 in | Wt 201.8 lb

## 2021-11-24 DIAGNOSIS — R053 Chronic cough: Secondary | ICD-10-CM

## 2021-11-24 DIAGNOSIS — I10 Essential (primary) hypertension: Secondary | ICD-10-CM | POA: Diagnosis not present

## 2021-11-24 DIAGNOSIS — R0982 Postnasal drip: Secondary | ICD-10-CM

## 2021-11-24 DIAGNOSIS — Z23 Encounter for immunization: Secondary | ICD-10-CM

## 2021-11-24 MED ORDER — IPRATROPIUM BROMIDE 0.03 % NA SOLN: 2.0000 | Freq: Three times a day (TID) | NASAL | 12 refills | Status: DC

## 2021-11-24 NOTE — Patient Instructions (Addendum)
2nd dose of shingrix today- done!  ?Please contact the [facility] and set up your mammogram asap! ?Address: [address], [place], [place] [address] ?Phone: [phone]- ? ?We will get a chest film for your chronic cough ? ?Please try the atrovent nasal spray as needed for post- nasal drainage, let me know if helpful for you ? ?I do recommend getting your covid series asap!  Immunity from your infection 2-3 years ago is likely waning  ?

## 2021-11-25 ENCOUNTER — Ambulatory Visit: Payer: BC Managed Care – PPO | Admitting: Gastroenterology

## 2021-11-25 ENCOUNTER — Encounter: Payer: Self-pay | Admitting: Gastroenterology

## 2021-11-25 VITALS — BP 126/80 | HR 86 | Ht 66.0 in | Wt 199.4 lb

## 2021-11-25 DIAGNOSIS — K219 Gastro-esophageal reflux disease without esophagitis: Secondary | ICD-10-CM | POA: Diagnosis not present

## 2021-11-25 DIAGNOSIS — Z1211 Encounter for screening for malignant neoplasm of colon: Secondary | ICD-10-CM

## 2021-11-25 DIAGNOSIS — K921 Melena: Secondary | ICD-10-CM | POA: Diagnosis not present

## 2021-11-25 MED ORDER — NA SULFATE-K SULFATE-MG SULF 17.5-3.13-1.6 GM/177ML PO SOLN: 1.0000 | Freq: Once | ORAL | 0 refills | Status: AC

## 2021-11-25 NOTE — Patient Instructions (Signed)
If you are age 63 or older, your body mass index should be between 23-30. Your Body mass index is 32.18 kg/m?Marland Kitchen If this is out of the aforementioned range listed, please consider follow up with your Primary Care Provider. ? ?If you are age 50 or younger, your body mass index should be between 19-25. Your Body mass index is 32.18 kg/m?Marland Kitchen If this is out of the aformentioned range listed, please consider follow up with your Primary Care Provider.  ? ?You have been scheduled for a colonoscopy. Please follow written instructions given to you at your visit today.  ?Please pick up your prep supplies at the pharmacy within the next 1-3 days. ?If you use inhalers (even only as needed), please bring them with you on the day of your procedure. ? ?Please purchase Metamucil over the counter. Take as directed.  ? ?The [facility] providers would like to encourage you to use [facility] to communicate with providers for non-urgent requests or questions.  Due to long hold times on the telephone, sending your provider a message by [facility] may be a faster and more efficient way to get a response.  Please allow 48 business hours for a response.  Please remember that this is for non-urgent requests.  ? ?It was a pleasure to see you today! ? ?Thank you for trusting me with your gastrointestinal care!   ? ?Scott E.Candis Schatz, MD  ? ?

## 2021-11-25 NOTE — Progress Notes (Signed)
? ?HPI : [name] is a very pleasant 61 year old female with history of depression and arthritis who is referred to [place] by Dr. Silvestre Mesi for further evaluation of painless rectal bleeding.  Patient states that she has had intermittent blood in her stool for a long time, but recently it has become more frequent and more profuse.  She would typically have 2 to 3 days of bleeding at a time, followed by periods of no bleeding.  She describes the bleeding as profuse amounts of bright red blood per rectum associated with bowel movements, and often continuing to drip from the anus after bowel movements.  Sometimes she will just pass blood without any stool.  When symptoms are particularly severe, she will need to wear pads.  She denies any other symptoms accompanying the bleeding such as abdominal pain, urgency/tenesmus or pain with the passage of stool.  She denies significant perianal itching or burning.  She reports appreciating a bulge or lump around the anus in the past, but not recently. ?She typically has regular bowel movements, usually once in the morning.  Stools are typically soft and formed.  Diarrhea has not been an issue for her in quite some time.  Every once in a while she will skip a day without a bowel movement.  No nocturnal stools. ?She reports undergoing a colonoscopy and upper endoscopy in her 32s because of numerous GI symptoms.  She believes these procedures were normal, with no abnormalities found. ?The patient has a diagnosis of ulcerative colitis in her medical history.  The patient states that she is unaware of this diagnosis, and that she has never been told she has ulcerative colitis and never been treated for ulcerative colitis. ?Recent CBC in January was normal. ? ?The patient has longstanding reflux symptoms of heartburn and acid regurgitation.  She has been taking Prilosec for over 15 years.  Typically her symptoms are well controlled, but recently they have been more frequent  and she is taking Tums in addition to the Prilosec several days a week.  She reports being under a lot of stress recently secondary to having to move and job stressors.  No dysphagia.  She has gained about 20 pounds in the last 6 months. ? ? ?Past Medical History:  ?Diagnosis Date  ? Arthritis   ? Right Shoulder, Feet, Fingers, Back, Neck  ? Bleeding nose   ? History of   ? Depression   ? Frequent urination   ? GERD (gastroesophageal reflux disease)   ? Gestational diabetes   ? Heart murmur   ? echo 10/20/2011  ? Hole in the ear drum, right   ? Hyperlipidemia   ? Minimal  ? Menopausal syndrome   ? Migraine without aura   ? Botox injection 10/21/2017  ? OA (osteoarthritis) of knee   ? Obese   ? Plantar warts   ? PMB (postmenopausal bleeding)   ? Pneumonia   ? history of   ? PONV (postoperative nausea and vomiting)   ? Postop Acute blood loss anemia 11/05/2011  ? TIA (transient ischemic attack) 06/05/2018  ? Ulcerative colitis ([place])   ? Urgency of urination   ? Vertigo   ? ? ? ?Past Surgical History:  ?Procedure Laterality Date  ? BREAST BIOPSY Left   ? COLONOSCOPY    ? ear drum patched Right   ? KNEE ARTHROSCOPY  2005  ? MOUTH SURGERY    ? gum surgery  ? skull fracture surgery    ?  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 TONSILLECTOMY  1979  ? TOTAL KNEE ARTHROPLASTY  11/02/2011  ? Procedure: TOTAL KNEE ARTHROPLASTY;  Surgeon: [name], MD;  Location: [facility];  Service: Orthopedics;  Laterality: Left;  ? TOTAL KNEE ARTHROPLASTY Right 10/25/2017  ? Procedure: RIGHT TOTAL KNEE ARTHROPLASTY;  Surgeon: Gaynelle Arabian, MD;  Location: [facility];  Service: Orthopedics;  Laterality: Right;  ? ?Family History  ?Problem Relation Age of Onset  ? Heart disease Mother   ?     mitral valve prolapse  ? Lung cancer Father   ? Thyroid disease Father   ? Stroke Father   ? Thyroid disease Sister   ? Heart disease Brother   ? Heart disease Maternal Grandfather   ? Heart disease Paternal Grandfather   ? Heart disease Maternal Aunt   ? Diabetes  Maternal Aunt   ? Heart disease Maternal Uncle   ? Heart disease Maternal Uncle   ? Heart disease Maternal Uncle   ? Heart disease Maternal Uncle   ? Thyroid disease Paternal Aunt   ? Colon cancer Neg Hx   ? ?Social History  ? ?Tobacco Use  ? Smoking status: Never  ? Smokeless tobacco: Never  ?Vaping Use  ? Vaping Use: Never used  ?Substance Use Topics  ? Alcohol use: Yes  ?  Comment: occassionally  ? Drug use: No  ? ?Current Outpatient Medications  ?Medication Sig Dispense Refill  ? aspirin EC 81 MG tablet Take 1 tablet (81 mg total) by mouth daily.    ? cetirizine (ZYRTEC) 10 MG tablet Take 10 mg by mouth at bedtime.    ? EMGALITY 120 MG/ML SOAJ Inject 1 mL into the skin every 30 (thirty) days.    ? fexofenadine (ALLEGRA) 180 MG tablet Take 180 mg by mouth daily.    ? guanFACINE (INTUNIV) 2 MG TB24 ER tablet Take 2 mg by mouth daily.    ? lisinopril (ZESTRIL) 10 MG tablet Take 1 tablet (10 mg total) by mouth daily. 90 tablet 3  ? Nutritional Supplements (JUICE PLUS FIBRE PO) Take 4 capsules by mouth daily. 2 capsule of fruit and 2 capsules of veggies    ? omeprazole (PRILOSEC OTC) 20 MG tablet Take 20 mg by mouth daily.    ? ondansetron (ZOFRAN) 4 MG tablet Take 1 tablet (4 mg total) by mouth every 8 (eight) hours as needed for nausea or vomiting. 20 tablet 0  ? oxybutynin (DITROPAN-XL) 10 MG 24 hr tablet TAKE 1 TABLET IN THE MORNING AND 2 TABLETS IN THE EVENING 270 tablet 3  ? terbinafine (LAMISIL) 250 MG tablet Take 1 tablet (250 mg total) by mouth daily. 90 tablet 0  ? topiramate (TOPAMAX) 50 MG tablet Take 50 mg by mouth daily in the afternoon.    ? venlafaxine (EFFEXOR-XR) 75 MG 24 hr capsule Take 225 mg by mouth daily after breakfast.    ? ipratropium (ATROVENT) 0.03 % nasal spray Place 2 sprays into both nostrils 3 (three) times daily. Use as needed for nasal drainage and cough (Patient not taking: Reported on 11/25/2021) 30 mL 12  ? meclizine (ANTIVERT) 12.5 MG tablet Take 1 tablet (12.5 mg total) by mouth 3  (three) times daily as needed for dizziness. (Patient not taking: Reported on 11/25/2021) 30 tablet 0  ? ?No current facility-administered medications for this visit.  ? ?Allergies  ?Allergen Reactions  ? Codeine Nausea And Vomiting  ? Onion Other (See Comments)  ?  Headache ?  ? ? ? ?  Review of Systems: ?All systems reviewed and negative except where noted in HPI.  ? ? ?DG Chest 2 View ? ?Result Date: 11/24/2021 ?CLINICAL DATA:  Chronic cough. EXAM: CHEST - 2 VIEW COMPARISON:  10/23/2011. FINDINGS: Trachea is midline. Heart size normal. Minimal biapical pleuroparenchymal scarring. Lungs are hyperinflated but clear. IMPRESSION: Hyperinflation without acute finding. Electronically Signed   By: Lorin Picket M.D.   On: 11/24/2021 14:11   ? ?Physical Exam: ?Ht 5' 6"  (1.676 m)   Wt 199 lb 6 oz (90.4 kg)   LMP 11/02/2011   BMI 32.18 kg/m?  ?Constitutional: Pleasant,well-developed, Caucasian female in no acute distress. ?HEENT: Normocephalic and atraumatic. Conjunctivae are normal. No scleral icterus. ?Neck supple.  ?Cardiovascular: Normal rate, regular rhythm.  ?Pulmonary/chest: Effort normal and breath sounds normal. No wheezing, rales or rhonchi. ?Abdominal: Soft, nondistended, nontender. Bowel sounds active throughout. There are no masses palpable. No hepatomegaly. ?Extremities: no edema ?Rectal: Deferred until time of colonoscopy ?Neurological: Alert and oriented to person place and time. ?Skin: Skin is warm and dry. No rashes noted. ?Psychiatric: Normal mood and affect. Behavior is normal. ? ?CBC ?   ?Component Value Date/Time  ? WBC 5.3 09/18/2021 1014  ? RBC 4.41 09/18/2021 1014  ? HGB 13.5 09/18/2021 1014  ? HCT 40.9 09/18/2021 1014  ? PLT 287.0 09/18/2021 1014  ? MCV 92.7 09/18/2021 1014  ? MCH 30.9 10/28/2017 0549  ? MCHC 33.0 09/18/2021 1014  ? RDW 14.4 09/18/2021 1014  ? LYMPHSABS 2.6 10/04/2013 1201  ? MONOABS 0.4 10/04/2013 1201  ? EOSABS 0.2 10/04/2013 1201  ? BASOSABS 0.0 10/04/2013 1201  ? ? ?CMP  ?    ?Component Value Date/Time  ? NA 136 09/18/2021 1014  ? K 4.1 09/18/2021 1014  ? CL 103 09/18/2021 1014  ? CO2 23 09/18/2021 1014  ? GLUCOSE 95 09/18/2021 1014  ? BUN 14 09/18/2021 1014  ? CREATININE 0.97 0

## 2021-12-09 ENCOUNTER — Telehealth: Payer: Self-pay

## 2021-12-09 NOTE — Telephone Encounter (Signed)
PA initiated via Covermymeds; KEY: [id number]. Awaiting determination.  ?

## 2021-12-11 NOTE — Telephone Encounter (Signed)
PA approved. Effective from 12/09/2021 through 12/08/2022. ?

## 2022-01-01 ENCOUNTER — Encounter: Payer: Self-pay | Admitting: Gastroenterology

## 2022-01-07 ENCOUNTER — Ambulatory Visit (AMBULATORY_SURGERY_CENTER): Payer: BC Managed Care – PPO | Admitting: Gastroenterology

## 2022-01-07 ENCOUNTER — Encounter: Payer: Self-pay | Admitting: Gastroenterology

## 2022-01-07 VITALS — BP 113/63 | HR 75 | Temp 98.6°F | Resp 14 | Ht 66.0 in | Wt 199.0 lb

## 2022-01-07 DIAGNOSIS — K621 Rectal polyp: Secondary | ICD-10-CM | POA: Diagnosis not present

## 2022-01-07 DIAGNOSIS — Z1211 Encounter for screening for malignant neoplasm of colon: Secondary | ICD-10-CM

## 2022-01-07 DIAGNOSIS — K648 Other hemorrhoids: Secondary | ICD-10-CM | POA: Diagnosis not present

## 2022-01-07 DIAGNOSIS — K921 Melena: Secondary | ICD-10-CM | POA: Diagnosis not present

## 2022-01-07 DIAGNOSIS — D129 Benign neoplasm of anus and anal canal: Secondary | ICD-10-CM

## 2022-01-07 MED ORDER — SODIUM CHLORIDE 0.9 % IV SOLN: 500.0000 mL | INTRAVENOUS | Status: DC

## 2022-01-07 NOTE — Progress Notes (Signed)
Pt's states no medical or surgical changes since previsit or office visit. 

## 2022-01-07 NOTE — Progress Notes (Signed)
Pt non-responsive, VVS, Report to RN  °

## 2022-01-07 NOTE — Op Note (Signed)
[place] ?Patient Name: [name] ?Procedure Date: 01/07/2022 8:57 AM ?MRN: [id number] ?Endoscopist: Rishika Mccollom E. Candis Schatz , MD ?Age: 61 ?Referring MD:  ?Date of Birth: Dec 28, 1960 ?Gender: Female ?Account #: [id number] ?Procedure:                Colonoscopy ?Indications:              Hematochezia ?Medicines:                Monitored Anesthesia Care ?Procedure:                Pre-Anesthesia Assessment: ?                          - Prior to the procedure, a History and Physical  ?                          was performed, and patient medications and  ?                          allergies were reviewed. The patient's tolerance of  ?                          previous anesthesia was also reviewed. The risks  ?                          and benefits of the procedure and the sedation  ?                          options and risks were discussed with the patient.  ?                          All questions were answered, and informed consent  ?                          was obtained. Prior Anticoagulants: The patient has  ?                          taken no previous anticoagulant or antiplatelet  ?                          agents except for aspirin. ASA Grade Assessment:  ?                          III - A patient with severe systemic disease. After  ?                          reviewing the risks and benefits, the patient was  ?                          deemed in satisfactory condition to undergo the  ?                          procedure. ?  After obtaining informed consent, the colonoscope  ?                          was passed under direct vision. Throughout the  ?                          procedure, the patient's blood pressure, pulse, and  ?                          oxygen saturations were monitored continuously. The  ?                          CF [id number] #[id number] was introduced through the anus  ?                          and advanced to the the cecum, identified by the  ?                           ileocecal valve. The colonoscopy was performed  ?                          without difficulty. The patient tolerated the  ?                          procedure well. The quality of the bowel  ?                          preparation was poor in that there was solid stool  ?                          and copious liquid stool with solid debris  ?                          throughout the colon. The ileocecal valve and the  ?                          rectum were photographed. The appendiceal orifice  ?                          was not able to be visualized due to inability to  ?                          lavage/suction the large amount stool in the cecum.  ?                          The bowel preparation used was SUPREP via split  ?                          dose instruction. ?Scope In: 9:18:39 AM ?Scope Out: 9:40:30 AM ?Scope Withdrawal Time: 0 hours 10 minutes 0 seconds  ?Total Procedure Duration: 0 hours 21 minutes 51 seconds  ?Findings:                 The perianal  and digital rectal examinations were  ?                          normal. Pertinent negatives include normal  ?                          sphincter tone and no palpable rectal lesions. ?                          A 3 mm polyp was found in the rectum. The polyp was  ?                          sessile. The polyp was removed with a cold snare.  ?                          Resection and retrieval were complete. Estimated  ?                          blood loss was minimal. ?                          A large amount of stool was found in the entire  ?                          colon, precluding visualization. ?                          The exam was otherwise normal throughout the  ?                          examined colon. ?                          Non-bleeding internal hemorrhoids were found during  ?                          retroflexion. ?                          No additional abnormalities were found on  ?                          retroflexion. ?Complications:             No immediate complications. ?Estimated Blood Loss:     Estimated blood loss was minimal. ?Impression:               - Preparation of the colon was poor. I think it was  ?                          adequate to exclude large mass lesions as the cause  ?                          of the patient's bright red blood per rectum, but  ?  inadequate for colon cancer screening (large lesion  ?                          in cecum cannot be excluded) ?                          - One 3 mm polyp in the rectum, removed with a cold  ?                          snare. Resected and retrieved. ?                          - Stool in the entire examined colon. ?                          - Non-bleeding internal hemorrhoids. This is the  ?                          most likely source of the patient's hematochezia. ?Recommendation:           - Patient has a contact number available for  ?                          emergencies. The signs and symptoms of potential  ?                          delayed complications were discussed with the  ?                          patient. Return to normal activities tomorrow.  ?                          Written discharge instructions were provided to the  ?                          patient. ?                          - Resume previous diet. ?                          - Continue present medications. ?                          - Await pathology results. ?                          - Repeat colonoscopy in 1 year because the bowel  ?                          preparation was poor. ?                          - Recommend 2 day bowel prep with next colonoscopy ?Jun Rightmyer E. Candis Schatz, MD ?01/07/2022 9:49:31 AM ?This report has been signed electronically. ?

## 2022-01-07 NOTE — Progress Notes (Signed)
Called to room to assist during endoscopic procedure.  Patient ID and intended procedure confirmed with present staff. Received instructions for my participation in the procedure from the performing physician.  

## 2022-01-07 NOTE — Progress Notes (Signed)
[facility] History and Physical ? ? ?Primary Care Physician:  Darreld Mclean, MD ? ? ?Reason for Procedure:   Hematochezia ? ?Plan:    Colonoscopy ? ? ? ? ?HPI: [name] is a 61 y.o. female undergoing colonoscopy to evaluate profuse painless rectal bleeding.  She has a history of intermittent rectal bleeding going back many years, but the bleeding has become much more profuse and frequent.  She has regular bowel movements.  She has no family history of colon cancer. ? ? ?Past Medical History:  ?Diagnosis Date  ? Arthritis   ? Right Shoulder, Feet, Fingers, Back, Neck  ? Bleeding nose   ? History of   ? Depression   ? Frequent urination   ? GERD (gastroesophageal reflux disease)   ? Gestational diabetes   ? Heart murmur   ? echo 10/20/2011  ? Hole in the ear drum, right   ? Hyperlipidemia   ? Minimal  ? Menopausal syndrome   ? Migraine without aura   ? Botox injection 10/21/2017  ? OA (osteoarthritis) of knee   ? Obese   ? Plantar warts   ? PMB (postmenopausal bleeding)   ? Pneumonia   ? history of   ? PONV (postoperative nausea and vomiting)   ? Postop Acute blood loss anemia 11/05/2011  ? TIA (transient ischemic attack) 06/05/2018  ? Ulcerative colitis ([place])   ? Urgency of urination   ? Vertigo   ? ? ?Past Surgical History:  ?Procedure Laterality Date  ? BREAST BIOPSY Left   ? COLONOSCOPY    ? ear drum patched Right   ? KNEE ARTHROSCOPY  2005  ? MOUTH SURGERY    ? gum surgery  ? skull fracture surgery    ? 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 TONSILLECTOMY  1979  ? TOTAL KNEE ARTHROPLASTY  11/02/2011  ? Procedure: TOTAL KNEE ARTHROPLASTY;  Surgeon: [name], MD;  Location: [facility];  Service: Orthopedics;  Laterality: Left;  ? TOTAL KNEE ARTHROPLASTY Right 10/25/2017  ? Procedure: RIGHT TOTAL KNEE ARTHROPLASTY;  Surgeon: Gaynelle Arabian, MD;  Location: [facility];  Service: Orthopedics;  Laterality: Right;  ? ? ?Prior to Admission medications   ?Medication Sig Start Date End Date Taking? Authorizing  Provider  ?aspirin EC 81 MG tablet Take 1 tablet (81 mg total) by mouth daily. 06/15/18   Penumalli, Earlean Polka, MD  ?cetirizine (ZYRTEC) 10 MG tablet Take 10 mg by mouth at bedtime.    [provider]  ?EMGALITY 120 MG/ML SOAJ Inject 1 mL into the skin every 30 (thirty) days. 11/11/21   [provider]  ?fexofenadine (ALLEGRA) 180 MG tablet Take 180 mg by mouth daily.    [provider]  ?guanFACINE (INTUNIV) 2 MG TB24 ER tablet Take 2 mg by mouth daily. 10/28/21   [provider]  ?ipratropium (ATROVENT) 0.03 % nasal spray Place 2 sprays into both nostrils 3 (three) times daily. Use as needed for nasal drainage and cough ?Patient not taking: Reported on 11/25/2021 11/24/21   Copland, Gay Filler, MD  ?lisinopril (ZESTRIL) 10 MG tablet Take 1 tablet (10 mg total) by mouth daily. 11/10/21   Copland, Gay Filler, MD  ?meclizine (ANTIVERT) 12.5 MG tablet Take 1 tablet (12.5 mg total) by mouth 3 (three) times daily as needed for dizziness. ?Patient not taking: Reported on 11/25/2021 03/23/19   Copland, Gay Filler, MD  ?Nutritional Supplements (JUICE PLUS FIBRE PO) Take 4 capsules by mouth daily. 2 capsule of  fruit and 2 capsules of veggies    [provider]  ?omeprazole (PRILOSEC OTC) 20 MG tablet Take 20 mg by mouth daily.    [provider]  ?ondansetron (ZOFRAN) 4 MG tablet Take 1 tablet (4 mg total) by mouth every 8 (eight) hours as needed for nausea or vomiting. 08/11/19   Copland, Gay Filler, MD  ?oxybutynin (DITROPAN-XL) 10 MG 24 hr tablet TAKE 1 TABLET IN THE MORNING AND 2 TABLETS IN THE EVENING 09/08/21   Copland, Gay Filler, MD  ?terbinafine (LAMISIL) 250 MG tablet Take 1 tablet (250 mg total) by mouth daily. 09/30/21   Copland, Gay Filler, MD  ?topiramate (TOPAMAX) 50 MG tablet Take 50 mg by mouth daily in the afternoon.    [provider]  ?venlafaxine (EFFEXOR-XR) 75 MG 24 hr capsule Take 225 mg by mouth daily after breakfast.    [provider]   ? ? ?Current Outpatient Medications  ?Medication Sig Dispense Refill  ? aspirin EC 81 MG tablet Take 1 tablet (81 mg total) by mouth daily.    ? cetirizine (ZYRTEC) 10 MG tablet Take 10 mg by mouth at bedtime.    ? EMGALITY 120 MG/ML SOAJ Inject 1 mL into the skin every 30 (thirty) days.    ? fexofenadine (ALLEGRA) 180 MG tablet Take 180 mg by mouth daily.    ? guanFACINE (INTUNIV) 2 MG TB24 ER tablet Take 2 mg by mouth daily.    ? ipratropium (ATROVENT) 0.03 % nasal spray Place 2 sprays into both nostrils 3 (three) times daily. Use as needed for nasal drainage and cough (Patient not taking: Reported on 11/25/2021) 30 mL 12  ? lisinopril (ZESTRIL) 10 MG tablet Take 1 tablet (10 mg total) by mouth daily. 90 tablet 3  ? meclizine (ANTIVERT) 12.5 MG tablet Take 1 tablet (12.5 mg total) by mouth 3 (three) times daily as needed for dizziness. (Patient not taking: Reported on 11/25/2021) 30 tablet 0  ? Nutritional Supplements (JUICE PLUS FIBRE PO) Take 4 capsules by mouth daily. 2 capsule of fruit and 2 capsules of veggies    ? omeprazole (PRILOSEC OTC) 20 MG tablet Take 20 mg by mouth daily.    ? ondansetron (ZOFRAN) 4 MG tablet Take 1 tablet (4 mg total) by mouth every 8 (eight) hours as needed for nausea or vomiting. 20 tablet 0  ? oxybutynin (DITROPAN-XL) 10 MG 24 hr tablet TAKE 1 TABLET IN THE MORNING AND 2 TABLETS IN THE EVENING 270 tablet 3  ? terbinafine (LAMISIL) 250 MG tablet Take 1 tablet (250 mg total) by mouth daily. 90 tablet 0  ? topiramate (TOPAMAX) 50 MG tablet Take 50 mg by mouth daily in the afternoon.    ? venlafaxine (EFFEXOR-XR) 75 MG 24 hr capsule Take 225 mg by mouth daily after breakfast.    ? ?No current facility-administered medications for this visit.  ? ? ?Allergies as of 01/07/2022 - Review Complete 11/25/2021  ?Allergen Reaction Noted  ? Codeine Nausea And Vomiting 08/10/2011  ? Onion Other (See Comments) 10/22/2011  ? ? ?Family History  ?Problem Relation Age of Onset  ? Heart disease Mother    ?     mitral valve prolapse  ? Lung cancer Father   ? Thyroid disease Father   ? Stroke Father   ? Thyroid disease Sister   ? Heart disease Brother   ? Heart disease Maternal Grandfather   ? Heart disease Paternal Grandfather   ? Heart disease Maternal Aunt   ?  Diabetes Maternal Aunt   ? Heart disease Maternal Uncle   ? Heart disease Maternal Uncle   ? Heart disease Maternal Uncle   ? Heart disease Maternal Uncle   ? Thyroid disease Paternal Aunt   ? Colon cancer Neg Hx   ? ? ?Social History  ? ?Socioeconomic History  ? Marital status: Divorced  ?  Spouse name: Not on file  ? Number of children: 1  ? Years of education: Not on file  ? Highest education level: Bachelor's degree (e.g., BA, AB, BS)  ?Occupational History  ?  Comment: [employer]  ?Tobacco Use  ? Smoking status: Never  ? Smokeless tobacco: Never  ?Vaping Use  ? Vaping Use: Never used  ?Substance and Sexual Activity  ? Alcohol use: Yes  ?  Comment: occassionally  ? Drug use: No  ? Sexual activity: Not Currently  ?  Birth control/protection: Post-menopausal  ?Other Topics Concern  ? Not on file  ?Social History Narrative  ? Lives alone  ? Caffeine- coffee, 2-3 cups daily  ? ?Social Determinants of Health  ? ?Financial Resource Strain: Not on file  ?Food Insecurity: Not on file  ?Transportation Needs: Not on file  ?Physical Activity: Not on file  ?Stress: Not on file  ?Social Connections: Not on file  ?Intimate Partner Violence: Not on file  ? ? ?Review of Systems: ? ?All other review of systems negative except as mentioned in the HPI. ? ?Physical Exam: ?Vital signs ?LMP 11/02/2011  ? ?General:   Alert,  Well-developed, well-nourished, pleasant and cooperative in NAD ?Airway:  Mallampati 2 ?Lungs:  Clear throughout to auscultation.   ?Heart:  Regular rate and rhythm; no murmurs, clicks, rubs,  or gallops. ?Abdomen:  Soft, nontender and nondistended. Normal bowel sounds.   ?Neuro/Psych:  Normal mood and affect. A and O x 3 ? ? ?Jhada Risk E.  Candis Schatz, MD ?[facility] ? ?

## 2022-01-07 NOTE — Patient Instructions (Signed)
Handout on polyps given. ? ?Await pathology results.   ? ?YOU HAD AN ENDOSCOPIC PROCEDURE TODAY AT [facility]:   Refer to the procedure report that was given to you for any specific questions about what was found during the examination.  If the procedure report does not answer your questions, please call your gastroenterologist to clarify.  If you requested that your care partner not be given the details of your procedure findings, then the procedure report has been included in a sealed envelope for you to review at your convenience later. ? ?YOU SHOULD EXPECT: Some feelings of bloating in the abdomen. Passage of more gas than usual.  Walking can help get rid of the air that was put into your GI tract during the procedure and reduce the bloating. If you had a lower endoscopy (such as a colonoscopy or flexible sigmoidoscopy) you may notice spotting of blood in your stool or on the toilet paper. If you underwent a bowel prep for your procedure, you may not have a normal bowel movement for a few days. ? ?Please Note:  You might notice some irritation and congestion in your nose or some drainage.  This is from the oxygen used during your procedure.  There is no need for concern and it should clear up in a day or so. ? ?SYMPTOMS TO REPORT IMMEDIATELY: ? ?Following lower endoscopy (colonoscopy or flexible sigmoidoscopy): ? Excessive amounts of blood in the stool ? Significant tenderness or worsening of abdominal pains ? Swelling of the abdomen that is new, acute ? Fever of 100?F or higher ? ?For urgent or emergent issues, a gastroenterologist can be reached at any hour by calling [phone]. ?Do not use MyChart messaging for urgent concerns.  ? ? ?DIET:  We do recommend a small meal at first, but then you may proceed to your regular diet.  Drink plenty of fluids but you should avoid alcoholic beverages for 24 hours. ? ?ACTIVITY:  You should plan to take it easy for the rest of today and you should  NOT DRIVE or use heavy machinery until tomorrow (because of the sedation medicines used during the test).   ? ?FOLLOW UP: ?Our staff will call the number listed on your records 48-72 hours following your procedure to check on you and address any questions or concerns that you may have regarding the information given to you following your procedure. If we do not reach you, we will leave a message.  We will attempt to reach you two times.  During this call, we will ask if you have developed any symptoms of COVID 19. If you develop any symptoms (ie: fever, flu-like symptoms, shortness of breath, cough etc.) before then, please call [phone].  If you test positive for Covid 19 in the 2 weeks post procedure, please call and report this information to [place].   ? ?If any biopsies were taken you will be contacted by phone or by letter within the next 1-3 weeks.  Please call us at [phone] if you have not heard about the biopsies in 3 weeks.  ? ? ?SIGNATURES/CONFIDENTIALITY: ?You and/or your care partner have signed paperwork which will be entered into your electronic medical record.  These signatures attest to the fact that that the information above on your After Visit Summary has been reviewed and is understood.  Full responsibility of the confidentiality of this discharge information lies with you and/or your care-partner.  ?

## 2022-01-08 ENCOUNTER — Telehealth: Payer: Self-pay

## 2022-01-08 NOTE — Telephone Encounter (Signed)
  Follow up Call-     01/07/2022    9:01 AM  Call back number  Post procedure Call Back phone  # [phone]  Permission to leave phone message Yes     Patient questions:  Do you have a fever, pain , or abdominal swelling? No. Pain Score  0 *  Have you tolerated food without any problems? Yes.    Have you been able to return to your normal activities? Yes.    Do you have any questions about your discharge instructions: Diet   No. Medications  No. Follow up visit  No.  Do you have questions or concerns about your Care? No.  Actions: * If pain score is 4 or above: No action needed, pain <4.

## 2022-01-12 NOTE — Progress Notes (Signed)
[name],  Good news: the polyp (or polyps) that I removed during your recent examination were NOT precancerous.  However, because of the inadequate bowel prep quality, it is still recommended you repeat colonoscopy in 1 year.    If you develop any new rectal bleeding, abdominal pain or significant bowel habit changes, please contact me before then.

## 2022-01-16 ENCOUNTER — Telehealth: Payer: Self-pay

## 2022-01-16 ENCOUNTER — Encounter: Payer: Self-pay | Admitting: Family Medicine

## 2022-01-16 NOTE — Telephone Encounter (Signed)
I have sent a message to [employer]illing [employer] asking them to please look into patient's billing concern and give patient a call at [phone].

## 2022-04-17 ENCOUNTER — Encounter: Payer: Self-pay | Admitting: Family Medicine

## 2022-05-09 DIAGNOSIS — F411 Generalized anxiety disorder: Secondary | ICD-10-CM | POA: Diagnosis not present

## 2022-05-09 DIAGNOSIS — F900 Attention-deficit hyperactivity disorder, predominantly inattentive type: Secondary | ICD-10-CM | POA: Diagnosis not present

## 2022-06-30 ENCOUNTER — Encounter: Payer: Self-pay | Admitting: Family Medicine

## 2022-06-30 NOTE — Telephone Encounter (Signed)
Called patient- I recommended that she go to the ER for evaluation now Pt declines ER- she is concerned about cost, she wants to come to the clinic tomorrow.  I again advised her that the ER now is her safest option.  She declines and I did schedule her for 10 am tomorrow, to ER if worse overnight

## 2022-07-01 ENCOUNTER — Ambulatory Visit: Payer: BC Managed Care – PPO | Admitting: Family Medicine

## 2022-07-01 ENCOUNTER — Ambulatory Visit (INDEPENDENT_AMBULATORY_CARE_PROVIDER_SITE_OTHER): Payer: BC Managed Care – PPO

## 2022-07-01 ENCOUNTER — Encounter: Payer: Self-pay | Admitting: Family Medicine

## 2022-07-01 VITALS — BP 124/75 | HR 98 | Temp 97.8°F | Resp 18 | Ht 66.0 in | Wt 193.2 lb

## 2022-07-01 DIAGNOSIS — R1030 Lower abdominal pain, unspecified: Secondary | ICD-10-CM

## 2022-07-01 DIAGNOSIS — K529 Noninfective gastroenteritis and colitis, unspecified: Secondary | ICD-10-CM

## 2022-07-01 DIAGNOSIS — N3000 Acute cystitis without hematuria: Secondary | ICD-10-CM

## 2022-07-01 DIAGNOSIS — K6389 Other specified diseases of intestine: Secondary | ICD-10-CM | POA: Diagnosis not present

## 2022-07-01 DIAGNOSIS — B351 Tinea unguium: Secondary | ICD-10-CM

## 2022-07-01 LAB — POCT URINALYSIS DIP (MANUAL ENTRY)
Bilirubin, UA: NEGATIVE
Blood, UA: NEGATIVE
Glucose, UA: NEGATIVE mg/dL
Ketones, POC UA: NEGATIVE mg/dL
Nitrite, UA: POSITIVE — AB
Protein Ur, POC: NEGATIVE mg/dL
Spec Grav, UA: 1.015 (ref 1.010–1.025)
Urobilinogen, UA: 0.2 E.U./dL
pH, UA: 5 (ref 5.0–8.0)

## 2022-07-01 LAB — COMPREHENSIVE METABOLIC PANEL
ALT: 10 U/L (ref 0–35)
AST: 13 U/L (ref 0–37)
Albumin: 4 g/dL (ref 3.5–5.2)
Alkaline Phosphatase: 63 U/L (ref 39–117)
BUN: 12 mg/dL (ref 6–23)
CO2: 26 mEq/L (ref 19–32)
Calcium: 9.3 mg/dL (ref 8.4–10.5)
Chloride: 102 mEq/L (ref 96–112)
Creatinine, Ser: 0.95 mg/dL (ref 0.40–1.20)
GFR: 63.94 mL/min (ref >60.00)
Glucose, Bld: 88 mg/dL (ref 70–99)
Potassium: 4 mEq/L (ref 3.5–5.1)
Sodium: 134 mEq/L — ABNORMAL LOW (ref 135–145)
Total Bilirubin: 0.3 mg/dL (ref 0.2–1.2)
Total Protein: 7.1 g/dL (ref 6.0–8.3)

## 2022-07-01 LAB — CBC
HCT: 36.9 % (ref 36.0–46.0)
Hemoglobin: 12.3 g/dL (ref 12.0–15.0)
MCHC: 33.2 g/dL (ref 30.0–36.0)
MCV: 90.1 fl (ref 78.0–100.0)
Platelets: 350 10*3/uL (ref 150.0–400.0)
RBC: 4.1 Mil/uL (ref 3.87–5.11)
RDW: 14.7 % (ref 11.5–15.5)
WBC: 7.1 10*3/uL (ref 4.0–10.5)

## 2022-07-01 LAB — LIPASE: Lipase: 18 U/L (ref 11.0–59.0)

## 2022-07-01 MED ORDER — METRONIDAZOLE 500 MG PO TABS: 500.0000 mg | ORAL_TABLET | Freq: Two times a day (BID) | ORAL | 0 refills | Status: AC

## 2022-07-01 MED ORDER — IOHEXOL 300 MG/ML  SOLN
100.0000 mL | Freq: Once | INTRAMUSCULAR | Status: AC | PRN
  Administered 2022-07-01: 100 mL via INTRAVENOUS

## 2022-07-01 MED ORDER — CIPROFLOXACIN HCL 500 MG PO TABS: 500.0000 mg | ORAL_TABLET | Freq: Two times a day (BID) | ORAL | 0 refills | Status: DC

## 2022-07-01 NOTE — Telephone Encounter (Signed)
In regards to 10 am appointment?

## 2022-07-01 NOTE — Progress Notes (Addendum)
[place] at [facility] [address], [place], [place] [address] [phone]  Date:  07/01/2022   Name:  [name]   DOB:  Jul 27, 1961   MRN:  [id number]  PCP:  Darreld Mclean, MD    Chief Complaint: Abdominal Pain (abdomen pain, nausea, vomiting at least 2xs since last Sat night.Marland Kitchen/)   History of Present Illness:  [name] is a 61 y.o. very pleasant female patient who presents with the following:  Patient seen today for concern of abdominal pain.  She contacted [place] yesterday and I recommended emergency room for her symptoms but she declined this advice-concerned about cost- we scheduled this appointment for today instead.   She contacted me this morning stating she felt better and was not sure if she should come to the appointment.  However, she reports that after eating today pain seemed to return She is not aware of any particular suspicious foods, no sick contacts MyChart message from yesterday: I have had abdomen pain, accompanied by some nausea, inability to eat and throwing up at least 2xs since last Sat night.  It started in my upper abdomen and was severe pain. I did throw up as soon as I got home and had a very large BM. I really though that might be my problem. But I continued to be in a lot of pain all night. Later in the day Sunday the pain moved to my lower abdomen. I continued to not want to eat. I took some Dulcolax hoping that would help. I continued to hurt that evening and throughout the night but it had eased some.  I felt better today, still no BM. I took 2 more of the Dulcolax. I was able to eat a little but not much.  After eating a little soup I noticed the pain in my lower abdomen had increased.  I took a shower and thought I'd try to nap after that. I started feeling nauseous in the shower and as soon as I got out of it I threw up.  I am hurting and feeling nauseous with a headache.  I had thought I  was getting better but now I don't know.  Not sure what to do next. Thank you.  Most recent visit with myself was April of this year-history of ulcerative colitis, GERD, migraine, depression, essential hypertension  She had a colonoscopy in May and no diverticulosis was noted-however, prep was poor limiting visualization-cannot rule out diverticulosis This am pt notes her abd pain came back after she ate and she had some nausea again today No more vomiting today  She has not noted fever  Aspirin 81 Emgality injection Lisinopril 10 Prilosec OTC Ditropan Topamax Effexor Patient Active Problem List   Diagnosis Date Noted   OA (osteoarthritis) of knee 10/25/2017   Ulcerative colitis, unspecified  see GI note Dr. Valli Glance 02/2007 05/13/2014   Post-menopausal bleeding  evalutated  Dr. Lynnette Caffey 03/13/2014   Blood in stool 03/13/2014   Fibroadenoma of left breast 04/10/2013   ANA positive 03/20/2013   Elevated transaminase measurement 03/15/2013   Vaginal spotting 12/08/2012   Abnormal Pap smear 10/13/2011   Cardiac murmur 10/13/2011   GERD (gastroesophageal reflux disease) 10/13/2011   Urticaria 10/13/2011   Arthritis    Migraine    Depression    Menopausal syndrome     Past Medical History:  Diagnosis Date   Arthritis    Right Shoulder, Feet, Fingers,  Back, Neck   Bleeding nose    History of    Depression    Frequent urination    GERD (gastroesophageal reflux disease)    Gestational diabetes    Heart murmur    echo 10/20/2011   Hole in the ear drum, right    Hyperlipidemia    Minimal   Menopausal syndrome    Migraine without aura    Botox injection 10/21/2017   OA (osteoarthritis) of knee    Obese    Plantar warts    PMB (postmenopausal bleeding)    Pneumonia    history of    PONV (postoperative nausea and vomiting)    Postop Acute blood loss anemia 11/05/2011   TIA (transient ischemic attack) 06/05/2018   Ulcerative colitis ([place])    Urgency of urination     Vertigo     Past Surgical History:  Procedure Laterality Date   BREAST BIOPSY Left    COLONOSCOPY     ear drum patched Right    KNEE ARTHROSCOPY  2005   MOUTH SURGERY     gum surgery   skull fracture surgery     calcinatied bone from [place]  11/02/2011   Procedure: TOTAL KNEE ARTHROPLASTY;  Surgeon: [name], MD;  Location: [facility];  Service: Orthopedics;  Laterality: Left;   TOTAL KNEE ARTHROPLASTY Right 10/25/2017   Procedure: RIGHT TOTAL KNEE ARTHROPLASTY;  Surgeon: Gaynelle Arabian, MD;  Location: [facility];  Service: Orthopedics;  Laterality: Right;    Social History   Tobacco Use   Smoking status: Never   Smokeless tobacco: Never  Vaping Use   Vaping Use: Never used  Substance Use Topics   Alcohol use: Yes    Comment: occassionally   Drug use: No    Family History  Problem Relation Age of Onset   Heart disease Mother        mitral valve prolapse   Lung cancer Father    Thyroid disease Father    Stroke Father    Thyroid disease Sister    Heart disease Brother    Heart disease Maternal Grandfather    Heart disease Paternal Grandfather    Heart disease Maternal Aunt    Diabetes Maternal Aunt    Heart disease Maternal Uncle    Heart disease Maternal Uncle    Heart disease Maternal Uncle    Heart disease Maternal Uncle    Thyroid disease Paternal Aunt    Colon cancer Neg Hx     Allergies  Allergen Reactions   Codeine Nausea And Vomiting   Onion Other (See Comments)    Headache     Medication list has been reviewed and updated.  Current Outpatient Medications on File Prior to Visit  Medication Sig Dispense Refill   aspirin EC 81 MG tablet Take 1 tablet (81 mg total) by mouth daily.     baclofen (LIORESAL) 10 MG tablet Take 10 mg by mouth 3 (three) times daily.     cetirizine (ZYRTEC) 10 MG tablet Take 10 mg by mouth at bedtime.     EMGALITY 120 MG/ML SOAJ Inject 1 mL into the skin every 30 (thirty)  days.     fexofenadine (ALLEGRA) 180 MG tablet Take 180 mg by mouth daily.     guanFACINE (INTUNIV) 2 MG TB24 ER tablet Take by mouth.     ipratropium (ATROVENT) 0.03 % nasal spray Place 2 sprays into both nostrils 3 (three)  times daily. Use as needed for nasal drainage and cough 30 mL 12   lisinopril (ZESTRIL) 10 MG tablet Take 1 tablet (10 mg total) by mouth daily. 90 tablet 3   Nutritional Supplements (JUICE PLUS FIBRE PO) Take 4 capsules by mouth daily. 2 capsule of fruit and 2 capsules of veggies     omeprazole (PRILOSEC OTC) 20 MG tablet Take 20 mg by mouth daily.     ondansetron (ZOFRAN) 4 MG tablet Take 1 tablet (4 mg total) by mouth every 8 (eight) hours as needed for nausea or vomiting. 20 tablet 0   oxybutynin (DITROPAN-XL) 10 MG 24 hr tablet TAKE 1 TABLET IN THE MORNING AND 2 TABLETS IN THE EVENING 270 tablet 3   topiramate (TOPAMAX) 50 MG tablet Take 50 mg by mouth daily in the afternoon.     No current facility-administered medications on file prior to visit.    Review of Systems:  As per HPI- otherwise negative. She has not noted any particular urinary symptoms  BP Readings from Last 3 Encounters:  07/01/22 124/75  01/07/22 113/63  11/25/21 126/80    Physical Examination: Vitals:   07/01/22 1014  BP: 124/75  Pulse: 98  Resp: 18  Temp: 97.8 F (36.6 C)  SpO2: 98%   Vitals:   07/01/22 1014  Weight: 193 lb 3.2 oz (87.6 kg)  Height: 5' 6"  (1.676 m)   Body mass index is 31.18 kg/m. Ideal Body Weight: Weight in (lb) to have BMI = 25: 154.6  GEN: no acute distress.  Mildly obese, appears well and nontoxic HEENT: Atraumatic, Normocephalic. Bilateral TM wnl, oropharynx normal.  PEERL,EOMI.   Ears and Nose: No external deformity. CV: RRR, No M/G/R. No JVD. No thrill. No extra heart sounds. PULM: CTA B, no wheezes, crackles, rhonchi. No retractions. No resp. distress. No accessory muscle use. ABD: S, ND, +BS. No rebound. No HSM.  Tenderness is present across the  lower abdomen, perhaps more in the left lower quadrant than right EXTR: No c/c/e PSYCH: Normally interactive. Conversant.   Results for orders placed or performed in visit on 07/01/22  POCT urinalysis dipstick  Result Value Ref Range   Color, UA yellow yellow   Clarity, UA cloudy (A) clear   Glucose, UA negative negative mg/dL   Bilirubin, UA negative negative   Ketones, POC UA negative negative mg/dL   Spec Grav, UA 1.015 1.010 - 1.025   Blood, UA negative negative   pH, UA 5.0 5.0 - 8.0   Protein Ur, POC negative negative mg/dL   Urobilinogen, UA 0.2 0.2 or 1.0 E.U./dL   Nitrite, UA Positive (A) Negative   Leukocytes, UA Small (1+) (A) Negative    Assessment and Plan: Lower abdominal pain - Plan: CBC, Comprehensive metabolic panel, Lipase, Urine Culture, POCT urinalysis dipstick, CT Abdomen Pelvis W Contrast  Onychomycosis  Patient seen today with concern of abdominal discomfort, suspect diverticulitis.  This would be a new diagnosis for patient, would like to get a CT for confirmation.  However, lab work must be done first-this will be ordered stat.  We hope to get her CT scheduled today In the meantime I advised her to stick with liquids only Urine culture also pending  Signed Lamar Blinks, MD  Received labs, message to patient  Received CT report: called her and went over her results.  As above, colitis is noted She does have a possible history of ulcerative colitis (this is questionable), however given new onset of pain, diarrhea and  vomiting suspect this may be infectious colitis Recommended she go on liquids only, if hungry consume low residue foods.  We will treat with Cipro and Flagyl for 1 week.  Urine culture is also pending I have asked her to let me know if not improving over the next couple of days, sooner if getting worse  CT Abdomen Pelvis W Contrast  Result Date: 07/01/2022 CLINICAL DATA:  Diverticulitis, complication suspected. Abdominal pain over the  last week. EXAM: CT ABDOMEN AND PELVIS WITH CONTRAST TECHNIQUE: Multidetector CT imaging of the abdomen and pelvis was performed using the standard protocol following bolus administration of intravenous contrast. RADIATION DOSE REDUCTION: This exam was performed according to the departmental dose-optimization program which includes automated exposure control, adjustment of the mA and/or kV according to patient size and/or use of iterative reconstruction technique. CONTRAST:  182m OMNIPAQUE IOHEXOL 300 MG/ML  SOLN COMPARISON:  None FINDINGS: Lower chest: Mild scarring at the lung bases. Hepatobiliary: Liver parenchyma is normal.  No calcified gallstones. Pancreas: Normal Spleen: Normal Adrenals/Urinary Tract: Adrenal glands are normal. Kidneys are normal. No cyst, mass, stone or hydronephrosis. Bladder is normal. Stomach/Bowel: Stomach and small intestine are normal. Normal appendix. Cecum and ascending colon appear normal. Wall thickening of the colon in the proximal transverse colon with mild surrounding edema. This could represent localized colitis. Focal mass lesion is not identified. Left colon appears normal without diverticulosis or diverticulitis. Vascular/Lymphatic: Aortic atherosclerosis. No aneurysm. IVC is normal. No adenopathy. Reproductive: No pelvic mass. Other: No free fluid or air. Musculoskeletal: Ordinary chronic degenerative change of the spine. IMPRESSION: 1. Wall thickening of the colon in the proximal transverse colon with mild surrounding edema. This could represent localized colitis. Focal mass lesion is not identified. No sign of bowel obstruction, free fluid or free air. 2. Aortic atherosclerosis. Aortic Atherosclerosis (ICD10-I70.0). Electronically Signed   By: MNelson ChimesM.D.   On: 07/01/2022 15:36     Received her urine culture 11/11- called pt Can add 5 days of macrobid to her regimen  She is getting better but still not well She will let me know if not continuing to improve  over the next few days   Results for orders placed or performed in visit on 07/01/22  Urine Culture   Specimen: Urine  Result Value Ref Range   MICRO NUMBER: [id number]   SPECIMEN QUALITY: Adequate    Sample Source NOT GIVEN    STATUS: FINAL    ISOLATE 1: Escherichia coli (A)       Susceptibility   Escherichia coli - URINE CULTURE, REFLEX    AMOX/CLAVULANIC 16 Intermediate     AMPICILLIN >=32 Resistant     AMPICILLIN/SULBACTAM >=32 Resistant     CEFAZOLIN* 8 Resistant      * For uncomplicated UTI caused by E. coli, K. pneumoniae or P. mirabilis: Cefazolin is susceptible if MIC <32 mcg/mL and predicts susceptible to the oral agents cefaclor, cefdinir, cefpodoxime, cefprozil, cefuroxime, cephalexin and loracarbef.     CEFTAZIDIME <=1 Sensitive     CEFEPIME <=1 Sensitive     CEFTRIAXONE <=1 Sensitive     CIPROFLOXACIN 0.5 Intermediate     LEVOFLOXACIN 1 Intermediate     GENTAMICIN >=16 Resistant     IMIPENEM <=0.25 Sensitive     NITROFURANTOIN <=16 Sensitive     PIP/TAZO 16 Sensitive     TOBRAMYCIN 8 Intermediate     TRIMETH/SULFA* <=20 Sensitive      * For uncomplicated UTI caused by E. coli, K.  pneumoniae or P. mirabilis: Cefazolin is susceptible if MIC <32 mcg/mL and predicts susceptible to the oral agents cefaclor, cefdinir, cefpodoxime, cefprozil, cefuroxime, cephalexin and loracarbef. Legend: S = Susceptible  I = Intermediate R = Resistant  NS = Not susceptible * = Not tested  NR = Not reported **NN = See antimicrobic comments   CBC  Result Value Ref Range   WBC 7.1 4.0 - 10.5 K/uL   RBC 4.10 3.87 - 5.11 Mil/uL   Platelets 350.0 150.0 - 400.0 K/uL   Hemoglobin 12.3 12.0 - 15.0 g/dL   HCT 36.9 36.0 - 46.0 %   MCV 90.1 78.0 - 100.0 fl   MCHC 33.2 30.0 - 36.0 g/dL   RDW 14.7 11.5 - 15.5 %  Comprehensive metabolic panel  Result Value Ref Range   Sodium 134 (L) 135 - 145 mEq/L   Potassium 4.0 3.5 - 5.1 mEq/L   Chloride 102 96 - 112 mEq/L   CO2 26 19 - 32  mEq/L   Glucose, Bld 88 70 - 99 mg/dL   BUN 12 6 - 23 mg/dL   Creatinine, Ser 0.95 0.40 - 1.20 mg/dL   Total Bilirubin 0.3 0.2 - 1.2 mg/dL   Alkaline Phosphatase 63 39 - 117 U/L   AST 13 0 - 37 U/L   ALT 10 0 - 35 U/L   Total Protein 7.1 6.0 - 8.3 g/dL   Albumin 4.0 3.5 - 5.2 g/dL   GFR 63.94 >60.00 mL/min   Calcium 9.3 8.4 - 10.5 mg/dL  Lipase  Result Value Ref Range   Lipase 18.0 11.0 - 59.0 U/L  POCT urinalysis dipstick  Result Value Ref Range   Color, UA yellow yellow   Clarity, UA cloudy (A) clear   Glucose, UA negative negative mg/dL   Bilirubin, UA negative negative   Ketones, POC UA negative negative mg/dL   Spec Grav, UA 1.015 1.010 - 1.025   Blood, UA negative negative   pH, UA 5.0 5.0 - 8.0   Protein Ur, POC negative negative mg/dL   Urobilinogen, UA 0.2 0.2 or 1.0 E.U./dL   Nitrite, UA Positive (A) Negative   Leukocytes, UA Small (1+) (A) Negative

## 2022-07-01 NOTE — Patient Instructions (Addendum)
Recommend flu shot, COVID-19 booster, dose of RSV this fall  We will get your labs run stat and hopefully get your CT scan done today In the interim would recommend liquids only

## 2022-07-04 LAB — URINE CULTURE
MICRO NUMBER:: 14161160
SPECIMEN QUALITY:: ADEQUATE

## 2022-07-04 MED ORDER — NITROFURANTOIN MONOHYD MACRO 100 MG PO CAPS: 100.0000 mg | ORAL_CAPSULE | Freq: Two times a day (BID) | ORAL | 0 refills | Status: DC

## 2022-07-04 NOTE — Addendum Note (Signed)
Addended by: Lamar Blinks C on: 07/04/2022 11:25 AM   Modules accepted: Orders

## 2022-07-12 ENCOUNTER — Encounter: Payer: Self-pay | Admitting: Family Medicine

## 2022-08-18 ENCOUNTER — Encounter: Payer: Self-pay | Admitting: Family Medicine

## 2022-08-18 NOTE — Patient Instructions (Incomplete)
It was good to see you again today, hope you are feeling much better soon I will be in touch with your chest x-ray Let me know if you are not improving with the antibiotic and steroid Cough syrup as needed but don't use if you need to drive!   Please keep me posted about your progress

## 2022-08-18 NOTE — Progress Notes (Unsigned)
[place] [facility] [address], [place], [place] [address] [phone]  Date:  08/19/2022   Name:  [name]   DOB:  26-Dec-1960   MRN:  [id number]  PCP:  Darreld Mclean, MD    Chief Complaint: No chief complaint on file.   History of Present Illness:  [name] is a 61 y.o. very pleasant female patient who presents with the following:  Patient seen today for concern of illness Most recent visit with myself was in November with abdominal pain-CT at that time showed colitis She contacted me yesterday with concern of illness, see MyChart message below:  The most urgent is I have been  battling for some time, a cold, congestion, sinus congestion , really bad sore throat and for the last couple of nights, non-stop coughing to the point of not being able to sleep, all  since Sunday, Dec. 17th.  I haven't scheduled an appointment because I had good and bad days and thought it was just a cold/sinus thing and needed to run its course.  Sadly I've had to miss all family and church Christmas activities. Didn't want to make anyone else sick. I did take a home Covid test and it was negative.  As far as I can tell I haven't run a big fever but I have stayed a bit warm.  The coughing is becoming a problem and hurts the throat more. I have kept an eye on my throat and it doesn't look like there is any infection. Of course I could be wrong. Lol One of the other reasons I tried to work through it is my phlegm has gotten worse, even with the nose drops, so I thought maybe that was affecting it.  Some night I am up coughing that mess up several times as well.  And having really bad reflux.  I don't feel as if I'm having any real breathing problems.  Any way, since it has been almost 10 days should I continue to ride this thing out or do you think I need to come in?   Patient Active Problem List   Diagnosis Date Noted   OA  (osteoarthritis) of knee 10/25/2017   Ulcerative colitis, unspecified  see GI note Dr. Valli Glance 02/2007 05/13/2014   Post-menopausal bleeding  evalutated  Dr. Lynnette Caffey 03/13/2014   Blood in stool 03/13/2014   Fibroadenoma of left breast 04/10/2013   ANA positive 03/20/2013   Elevated transaminase measurement 03/15/2013   Vaginal spotting 12/08/2012   Abnormal Pap smear 10/13/2011   Cardiac murmur 10/13/2011   GERD (gastroesophageal reflux disease) 10/13/2011   Urticaria 10/13/2011   Arthritis    Migraine    Depression    Menopausal syndrome     Past Medical History:  Diagnosis Date   Arthritis    Right Shoulder, Feet, Fingers, Back, Neck   Bleeding nose    History of    Depression    Frequent urination    GERD (gastroesophageal reflux disease)    Gestational diabetes    Heart murmur    echo 10/20/2011   Hole in the ear drum, right    Hyperlipidemia    Minimal   Menopausal syndrome    Migraine without aura    Botox injection 10/21/2017   OA (osteoarthritis) of knee    Obese    Plantar warts    PMB (postmenopausal bleeding)    Pneumonia    history  of    PONV (postoperative nausea and vomiting)    Postop Acute blood loss anemia 11/05/2011   TIA (transient ischemic attack) 06/05/2018   Ulcerative colitis ([place])    Urgency of urination    Vertigo     Past Surgical History:  Procedure Laterality Date   BREAST BIOPSY Left    COLONOSCOPY     ear drum patched Right    KNEE ARTHROSCOPY  2005   MOUTH SURGERY     gum surgery   skull fracture surgery     calcinatied bone from [place]  11/02/2011   Procedure: TOTAL KNEE ARTHROPLASTY;  Surgeon: [name], MD;  Location: [facility];  Service: Orthopedics;  Laterality: Left;   TOTAL KNEE ARTHROPLASTY Right 10/25/2017   Procedure: RIGHT TOTAL KNEE ARTHROPLASTY;  Surgeon: Gaynelle Arabian, MD;  Location: [facility];  Service: Orthopedics;  Laterality: Right;    Social History    Tobacco Use   Smoking status: Never   Smokeless tobacco: Never  Vaping Use   Vaping Use: Never used  Substance Use Topics   Alcohol use: Yes    Comment: occassionally   Drug use: No    Family History  Problem Relation Age of Onset   Heart disease Mother        mitral valve prolapse   Lung cancer Father    Thyroid disease Father    Stroke Father    Thyroid disease Sister    Heart disease Brother    Heart disease Maternal Grandfather    Heart disease Paternal Grandfather    Heart disease Maternal Aunt    Diabetes Maternal Aunt    Heart disease Maternal Uncle    Heart disease Maternal Uncle    Heart disease Maternal Uncle    Heart disease Maternal Uncle    Thyroid disease Paternal Aunt    Colon cancer Neg Hx     Allergies  Allergen Reactions   Codeine Nausea And Vomiting   Onion Other (See Comments)    Headache     Medication list has been reviewed and updated.  Current Outpatient Medications on File Prior to Visit  Medication Sig Dispense Refill   aspirin EC 81 MG tablet Take 1 tablet (81 mg total) by mouth daily.     baclofen (LIORESAL) 10 MG tablet Take 10 mg by mouth 3 (three) times daily.     cetirizine (ZYRTEC) 10 MG tablet Take 10 mg by mouth at bedtime.     ciprofloxacin (CIPRO) 500 MG tablet Take 1 tablet (500 mg total) by mouth 2 (two) times daily. 14 tablet 0   EMGALITY 120 MG/ML SOAJ Inject 1 mL into the skin every 30 (thirty) days.     fexofenadine (ALLEGRA) 180 MG tablet Take 180 mg by mouth daily.     guanFACINE (INTUNIV) 2 MG TB24 ER tablet Take by mouth.     ipratropium (ATROVENT) 0.03 % nasal spray Place 2 sprays into both nostrils 3 (three) times daily. Use as needed for nasal drainage and cough 30 mL 12   lisinopril (ZESTRIL) 10 MG tablet Take 1 tablet (10 mg total) by mouth daily. 90 tablet 3   nitrofurantoin, macrocrystal-monohydrate, (MACROBID) 100 MG capsule Take 1 capsule (100 mg total) by mouth 2 (two) times daily. 10 capsule 0    Nutritional Supplements (JUICE PLUS FIBRE PO) Take 4 capsules by mouth daily. 2 capsule of fruit and 2 capsules of veggies  omeprazole (PRILOSEC OTC) 20 MG tablet Take 20 mg by mouth daily.     ondansetron (ZOFRAN) 4 MG tablet Take 1 tablet (4 mg total) by mouth every 8 (eight) hours as needed for nausea or vomiting. 20 tablet 0   oxybutynin (DITROPAN-XL) 10 MG 24 hr tablet TAKE 1 TABLET IN THE MORNING AND 2 TABLETS IN THE EVENING 270 tablet 3   topiramate (TOPAMAX) 50 MG tablet Take 50 mg by mouth daily in the afternoon.     No current facility-administered medications on file prior to visit.    Review of Systems:  As per HPI- otherwise negative.   Physical Examination: There were no vitals filed for this visit. There were no vitals filed for this visit. There is no height or weight on file to calculate BMI. Ideal Body Weight:    GEN: no acute distress. HEENT: Atraumatic, Normocephalic.  Ears and Nose: No external deformity. CV: RRR, No M/G/R. No JVD. No thrill. No extra heart sounds. PULM: CTA B, no wheezes, crackles, rhonchi. No retractions. No resp. distress. No accessory muscle use. ABD: S, NT, ND, +BS. No rebound. No HSM. EXTR: No c/c/e PSYCH: Normally interactive. Conversant.    Assessment and Plan: ***  Signed Lamar Blinks, MD

## 2022-08-19 ENCOUNTER — Ambulatory Visit (INDEPENDENT_AMBULATORY_CARE_PROVIDER_SITE_OTHER): Payer: BC Managed Care – PPO

## 2022-08-19 ENCOUNTER — Ambulatory Visit: Payer: BC Managed Care – PPO | Admitting: Family Medicine

## 2022-08-19 ENCOUNTER — Encounter: Payer: Self-pay | Admitting: Family Medicine

## 2022-08-19 VITALS — BP 130/68 | HR 82 | Temp 97.5°F | Resp 16 | Ht 66.0 in | Wt 191.8 lb

## 2022-08-19 DIAGNOSIS — J209 Acute bronchitis, unspecified: Secondary | ICD-10-CM

## 2022-08-19 DIAGNOSIS — R059 Cough, unspecified: Secondary | ICD-10-CM

## 2022-08-19 MED ORDER — PREDNISONE 20 MG PO TABS: ORAL_TABLET | ORAL | 0 refills | Status: DC

## 2022-08-19 MED ORDER — HYDROCOD POLI-CHLORPHE POLI ER 10-8 MG/5ML PO SUER: 5.0000 mL | Freq: Two times a day (BID) | ORAL | 0 refills | Status: DC | PRN

## 2022-08-19 MED ORDER — HYDROCODONE BIT-HOMATROP MBR 5-1.5 MG/5ML PO SOLN: 5.0000 mL | Freq: Three times a day (TID) | ORAL | 0 refills | Status: DC | PRN

## 2022-08-19 MED ORDER — DOXYCYCLINE HYCLATE 100 MG PO TABS: 100.0000 mg | ORAL_TABLET | Freq: Two times a day (BID) | ORAL | 0 refills | Status: DC

## 2022-09-06 ENCOUNTER — Other Ambulatory Visit: Payer: Self-pay | Admitting: Family Medicine

## 2022-09-06 DIAGNOSIS — I10 Essential (primary) hypertension: Secondary | ICD-10-CM

## 2022-09-11 ENCOUNTER — Other Ambulatory Visit: Payer: Self-pay | Admitting: Family Medicine

## 2022-09-11 DIAGNOSIS — N3941 Urge incontinence: Secondary | ICD-10-CM

## 2022-10-02 ENCOUNTER — Other Ambulatory Visit: Payer: BC Managed Care – PPO

## 2022-10-02 ENCOUNTER — Encounter (INDEPENDENT_AMBULATORY_CARE_PROVIDER_SITE_OTHER): Payer: BC Managed Care – PPO | Admitting: Family Medicine

## 2022-10-02 DIAGNOSIS — R300 Dysuria: Secondary | ICD-10-CM

## 2022-10-02 MED ORDER — CEPHALEXIN 500 MG PO CAPS: 500.0000 mg | ORAL_CAPSULE | Freq: Two times a day (BID) | ORAL | 0 refills | Status: DC

## 2022-10-02 NOTE — Addendum Note (Signed)
Addended by: Lamar Blinks C on: 10/02/2022 12:24 PM   Modules accepted: Orders

## 2022-10-02 NOTE — Telephone Encounter (Signed)
Please see the MyChart message reply(ies) for my assessment and plan.  The patient gave consent for this Medical Advice Message and is aware that it may result in a bill to their insurance company as well as the possibility that this may result in a co-payment or deductible. They are an established patient, but are not seeking medical advice exclusively about a problem treated during an in person or video visit in the last 7 days. I did not recommend an in person or video visit within 7 days of my reply.  I spent a total of 12 minutes cumulative time within 7 days through [facility] messaging Lamar Blinks, MD

## 2022-10-04 LAB — URINE CULTURE
MICRO NUMBER:: 14544925
SPECIMEN QUALITY:: ADEQUATE

## 2022-10-04 MED ORDER — NITROFURANTOIN MONOHYD MACRO 100 MG PO CAPS: 100.0000 mg | ORAL_CAPSULE | Freq: Two times a day (BID) | ORAL | 0 refills | Status: DC

## 2022-10-04 NOTE — Addendum Note (Signed)
Addended by: Lamar Blinks C on: 10/04/2022 01:52 PM   Modules accepted: Orders

## 2022-11-04 DIAGNOSIS — F900 Attention-deficit hyperactivity disorder, predominantly inattentive type: Secondary | ICD-10-CM | POA: Diagnosis not present

## 2022-11-04 DIAGNOSIS — F411 Generalized anxiety disorder: Secondary | ICD-10-CM | POA: Diagnosis not present

## 2022-11-06 ENCOUNTER — Encounter: Payer: Self-pay | Admitting: Family Medicine

## 2022-11-07 NOTE — Progress Notes (Addendum)
[facility] [address] [place], [place] [address] [phone]  Date:  11/11/2022   Name:  [name]   DOB:  12-24-1960   MRN:  [id number]  PCP:  Pearline Cables, MD    Chief Complaint: extremely hot after activities (Concerns/ questions: pt says with her [employer] insurance plan the Lisinopril will be a tier 3. /Mamm due/HIV screen due)   History of Present Illness:  [name] is a 62 y.o. very pleasant female patient who presents with the following:  Pt seen today with concern of feeling hot - abnormally so- after exercise Last seen by myself in December  She send me the following message last week: Anytime I get physically busy or doing something a bit strenuous, I will get very hot, headachy, and little dizzy and nausea.  I literally have to lay down for a while.  Maybe it's a medication, I don't know but it's becoming quite a problem.  Yesterday I was going to hang some mini blinds. I literally got one side of the brackets up before I felt very hot and sick.  Went to bed for about two hours.  I do take Strattera and Dr. Evelene Croon asked me about sweating but I forgot about these little episodes.  She just moved my dosage to 100mg s because I have a lot of fatigue.  It's concerning and certainly limits what I can do.  I know I have heart, diabetes, and thyroid issues in my family history.  But with my vertigo it compounds it sometimes and it takes awhile to feel better.  I just went back to put the other side up for the mini blinds and got really hot again and this time threw up. I seldom get that sick but always feel like I might.   Most recent labs done in November  She has not had any significant cardiac w/o since 2019 when she had an echo and event monitor- both of which looked ok   She notes that last week she threw up after trying to hang some mini- blinds-she was standing on a stepladder trying to install  a bracket for about 10 minutes when she started feel terrible She had to lie down and rest for a couple of hours She also has noticed these sx with activities such as rolling out her trash can, showering She has noted these sx off and on for 6 months if not longer but getting worse She did have hot flashes from menopause years ago, but these have resolved  She has noted these sx every day recently except for yesterday   She went on strattera a few months ago, she is not really sure when this was changed  Otherwise no changes to her medication   She is overall feeling very tired as well for several months She has noted SOB when she has these sweating episodes She may notice some chest tightness but not pressure or pain per her report    Patient Active Problem List   Diagnosis Date Noted   OA (osteoarthritis) of knee 10/25/2017   Ulcerative colitis, unspecified  see GI note Dr. Vernell Barrier 02/2007 05/13/2014   Post-menopausal bleeding  evalutated  Dr. Langston Masker 03/13/2014   Blood in stool 03/13/2014   Fibroadenoma of left breast 04/10/2013   ANA positive 03/20/2013   Elevated transaminase measurement 03/15/2013   Vaginal spotting 12/08/2012   Abnormal Pap smear 10/13/2011  Cardiac murmur 10/13/2011   GERD (gastroesophageal reflux disease) 10/13/2011   Urticaria 10/13/2011   Arthritis    Migraine    Depression    Menopausal syndrome     Past Medical History:  Diagnosis Date   Arthritis    Right Shoulder, Feet, Fingers, Back, Neck   Bleeding nose    History of    Depression    Frequent urination    GERD (gastroesophageal reflux disease)    Gestational diabetes    Heart murmur    echo 10/20/2011   Hole in the ear drum, right    Hyperlipidemia    Minimal   Menopausal syndrome    Migraine without aura    Botox injection 10/21/2017   OA (osteoarthritis) of knee    Obese    Plantar warts    PMB (postmenopausal bleeding)    Pneumonia    history of    PONV (postoperative  nausea and vomiting)    Postop Acute blood loss anemia 11/05/2011   TIA (transient ischemic attack) 06/05/2018   Ulcerative colitis ([facility])    Urgency of urination    Vertigo     Past Surgical History:  Procedure Laterality Date   BREAST BIOPSY Left    COLONOSCOPY     ear drum patched Right    KNEE ARTHROSCOPY  2005   MOUTH SURGERY     gum surgery   skull fracture surgery     calcinatied bone from forehead   TONSILLECTOMY  1979   TOTAL KNEE ARTHROPLASTY  11/02/2011   Procedure: TOTAL KNEE ARTHROPLASTY;  Surgeon: Loanne Drilling, MD;  Location: [facility];  Service: Orthopedics;  Laterality: Left;   TOTAL KNEE ARTHROPLASTY Right 10/25/2017   Procedure: RIGHT TOTAL KNEE ARTHROPLASTY;  Surgeon: Ollen Gross, MD;  Location: [facility];  Service: Orthopedics;  Laterality: Right;    Social History   Tobacco Use   Smoking status: Never   Smokeless tobacco: Never  Vaping Use   Vaping Use: Never used  Substance Use Topics   Alcohol use: Yes    Comment: occassionally   Drug use: No    Family History  Problem Relation Age of Onset   Heart disease Mother        mitral valve prolapse   Lung cancer Father    Thyroid disease Father    Stroke Father    Thyroid disease Sister    Heart disease Brother    Heart disease Maternal Grandfather    Heart disease Paternal Grandfather    Heart disease Maternal Aunt    Diabetes Maternal Aunt    Heart disease Maternal Uncle    Heart disease Maternal Uncle    Heart disease Maternal Uncle    Heart disease Maternal Uncle    Thyroid disease Paternal Aunt    Colon cancer Neg Hx     Allergies  Allergen Reactions   Codeine Nausea And Vomiting   Onion Other (See Comments)    Headache     Medication list has been reviewed and updated.  Current Outpatient Medications on File Prior to Visit  Medication Sig Dispense Refill   aspirin EC 81 MG tablet Take 1 tablet (81 mg total) by mouth daily.     baclofen (LIORESAL) 10 MG tablet Take 10 mg by mouth  3 (three) times daily.     cetirizine (ZYRTEC) 10 MG tablet Take 10 mg by mouth at bedtime.     fexofenadine (ALLEGRA) 180 MG tablet Take 180 mg by mouth daily.  lisinopril (ZESTRIL) 10 MG tablet TAKE 1 TABLET BY MOUTH EVERY DAY 90 tablet 3   Nutritional Supplements (JUICE PLUS FIBRE PO) Take 4 capsules by mouth daily. 2 capsule of fruit and 2 capsules of veggies     omeprazole (PRILOSEC OTC) 20 MG tablet Take 20 mg by mouth daily.     ondansetron (ZOFRAN) 4 MG tablet Take 1 tablet (4 mg total) by mouth every 8 (eight) hours as needed for nausea or vomiting. 20 tablet 0   oxybutynin (DITROPAN-XL) 10 MG 24 hr tablet TAKE 1 TABLET BY MOUTH EVERY MORNING AND 2 TABLETS EVERY EVENING 270 tablet 3   topiramate (TOPAMAX) 50 MG tablet Take 50 mg by mouth daily in the afternoon.     No current facility-administered medications on file prior to visit.    Review of Systems:  As per HPI- otherwise negative.   Physical Examination: Vitals:   11/11/22 1416  BP: 122/80  Pulse: (!) 111  Resp: 18  Temp: (!) 97 F (36.1 C)  SpO2: 97%   Vitals:   11/11/22 1416  Weight: 198 lb 9.6 oz (90.1 kg)  Height: 5\' 6"  (1.676 m)   Body mass index is 32.05 kg/m. Ideal Body Weight: Weight in (lb) to have BMI = 25: 154.6  GEN: no acute distress.  Mildly obese, otherwise looks well HEENT: Atraumatic, Normocephalic.  Ears and Nose: No external deformity. CV: RRR, No M/G/R. No JVD. No thrill. No extra heart sounds. PULM: CTA B, no wheezes, crackles, rhonchi. No retractions. No resp. distress. No accessory muscle use. ABD: S, NT, ND, +BS. No rebound. No HSM. EXTR: No c/c/e PSYCH: Normally interactive. Conversant.   EKG: SR with rate 93 Compared with tracing from 2019 there is some possible left atrial enlargement Assessment and Plan: Nausea - Plan: EKG 12-Lead  PND (post-nasal drip) - Plan: ipratropium (ATROVENT) 0.03 % nasal spray  Essential hypertension - Plan: CBC, Comprehensive metabolic panel,  TSH, Lipid panel  SOB (shortness of breath) - Plan: Troponin I (High Sensitivity), D-Dimer, Quantitative, DG Chest 2 View, Ambulatory referral to Cardiology  Screening for diabetes mellitus - Plan: Hemoglobin A1c  Patient seen today with concern of symptoms with physical exertion, nausea and sometimes shortness of breath.  I explained that I am concerned this could indicate heart disease, going to the ER now for immediate evaluation would be prudent.  Patient declines at this time- an ER visit is very expensive under her insurance plan. We will start with lab work and a chest x-ray as above.  I also placed an urgent referral to cardiology.  I asked her to refrain from any physical exertion until we get more information, if symptoms come back and do not resolve with a brief rest as usual she will go to the emergency room Signed Abbe Amsterdam, MD  Received labs as below and chest x-ray  Results for orders placed or performed in visit on 11/11/22  CBC  Result Value Ref Range   WBC 5.6 4.0 - 10.5 K/uL   RBC 4.30 3.87 - 5.11 Mil/uL   Platelets 334.0 150.0 - 400.0 K/uL   Hemoglobin 12.4 12.0 - 15.0 g/dL   HCT 78.2 95.6 - 21.3 %   MCV 88.0 78.0 - 100.0 fl   MCHC 32.9 30.0 - 36.0 g/dL   RDW 08.6 57.8 - 46.9 %  Comprehensive metabolic panel  Result Value Ref Range   Sodium 133 (L) 135 - 145 mEq/L   Potassium 4.3 3.5 - 5.1 mEq/L  Chloride 103 96 - 112 mEq/L   CO2 23 19 - 32 mEq/L   Glucose, Bld 113 (H) 70 - 99 mg/dL   BUN 17 6 - 23 mg/dL   Creatinine, Ser 4.09 0.40 - 1.20 mg/dL   Total Bilirubin 0.3 0.2 - 1.2 mg/dL   Alkaline Phosphatase 59 39 - 117 U/L   AST 15 0 - 37 U/L   ALT 13 0 - 35 U/L   Total Protein 6.9 6.0 - 8.3 g/dL   Albumin 4.2 3.5 - 5.2 g/dL   GFR 81.19 (L) >14.78 mL/min   Calcium 9.4 8.4 - 10.5 mg/dL  TSH  Result Value Ref Range   TSH 1.20 0.35 - 5.50 uIU/mL  D-Dimer, Quantitative  Result Value Ref Range   D-Dimer, Quant 0.54 (H) <0.50 mcg/mL FEU  Lipid panel   Result Value Ref Range   Cholesterol 205 (H) 0 - 200 mg/dL   Triglycerides 29.5 0.0 - 149.0 mg/dL   HDL 62.13 >08.65 mg/dL   VLDL 78.4 0.0 - 69.6 mg/dL   LDL Cholesterol 295 (H) 0 - 99 mg/dL   Total CHOL/HDL Ratio 3    NonHDL 135.39   Hemoglobin A1c  Result Value Ref Range   Hgb A1c MFr Bld 6.1 4.6 - 6.5 %  Troponin I (High Sensitivity)  Result Value Ref Range   High Sens Troponin I 3 2 - 17 ng/L    DG Chest 2 View  Result Date: 11/11/2022 CLINICAL DATA:  SOB EXAM: CHEST - 2 VIEW COMPARISON:  08/19/2022 FINDINGS: The heart size and mediastinal contours are within normal limits. Both lungs are clear. Aorta is calcified. The visualized skeletal structures are unremarkable. IMPRESSION: No active cardiopulmonary disease. Electronically Signed   By: Layla Maw M.D.   On: 11/11/2022 15:36    Received additional labs as below, message to patient  Results for orders placed or performed in visit on 11/11/22  CBC  Result Value Ref Range   WBC 5.6 4.0 - 10.5 K/uL   RBC 4.30 3.87 - 5.11 Mil/uL   Platelets 334.0 150.0 - 400.0 K/uL   Hemoglobin 12.4 12.0 - 15.0 g/dL   HCT 28.4 13.2 - 44.0 %   MCV 88.0 78.0 - 100.0 fl   MCHC 32.9 30.0 - 36.0 g/dL   RDW 10.2 72.5 - 36.6 %  Comprehensive metabolic panel  Result Value Ref Range   Sodium 133 (L) 135 - 145 mEq/L   Potassium 4.3 3.5 - 5.1 mEq/L   Chloride 103 96 - 112 mEq/L   CO2 23 19 - 32 mEq/L   Glucose, Bld 113 (H) 70 - 99 mg/dL   BUN 17 6 - 23 mg/dL   Creatinine, Ser 4.40 0.40 - 1.20 mg/dL   Total Bilirubin 0.3 0.2 - 1.2 mg/dL   Alkaline Phosphatase 59 39 - 117 U/L   AST 15 0 - 37 U/L   ALT 13 0 - 35 U/L   Total Protein 6.9 6.0 - 8.3 g/dL   Albumin 4.2 3.5 - 5.2 g/dL   GFR 34.74 (L) >25.95 mL/min   Calcium 9.4 8.4 - 10.5 mg/dL  TSH  Result Value Ref Range   TSH 1.20 0.35 - 5.50 uIU/mL  D-Dimer, Quantitative  Result Value Ref Range   D-Dimer, Quant 0.54 (H) <0.50 mcg/mL FEU  Lipid panel  Result Value Ref Range    Cholesterol 205 (H) 0 - 200 mg/dL   Triglycerides 63.8 0.0 - 149.0 mg/dL   HDL 75.64 >33.29 mg/dL  VLDL 19.6 0.0 - 40.0 mg/dL   LDL Cholesterol 102 (H) 0 - 99 mg/dL   Total CHOL/HDL Ratio 3    NonHDL 135.39   Hemoglobin A1c  Result Value Ref Range   Hgb A1c MFr Bld 6.1 4.6 - 6.5 %  Troponin I (High Sensitivity)  Result Value Ref Range   High Sens Troponin I 3 2 - 17 ng/L   Received + d dimer 3/21- called pt and relayed this info Ordered CT angiogram for her Called cardiology to get first available appt for her  [facility] [address] [place] 10:30 with Dr Clifton James on 3/22

## 2022-11-11 ENCOUNTER — Ambulatory Visit (HOSPITAL_BASED_OUTPATIENT_CLINIC_OR_DEPARTMENT_OTHER)
Admission: RE | Admit: 2022-11-11 | Discharge: 2022-11-11 | Disposition: A | Discharge status: Home / Self Care | Payer: BC Managed Care – PPO | Source: Ambulatory Visit | Attending: Family Medicine | Admitting: Family Medicine

## 2022-11-11 ENCOUNTER — Ambulatory Visit: Payer: BC Managed Care – PPO | Admitting: Family Medicine

## 2022-11-11 ENCOUNTER — Encounter: Payer: Self-pay | Admitting: Family Medicine

## 2022-11-11 VITALS — BP 122/80 | HR 111 | Temp 97.0°F | Resp 18 | Ht 66.0 in | Wt 198.6 lb

## 2022-11-11 DIAGNOSIS — R0982 Postnasal drip: Secondary | ICD-10-CM

## 2022-11-11 DIAGNOSIS — R7989 Other specified abnormal findings of blood chemistry: Secondary | ICD-10-CM

## 2022-11-11 DIAGNOSIS — R110 Nausea: Secondary | ICD-10-CM | POA: Diagnosis not present

## 2022-11-11 DIAGNOSIS — I10 Essential (primary) hypertension: Secondary | ICD-10-CM

## 2022-11-11 DIAGNOSIS — R0602 Shortness of breath: Secondary | ICD-10-CM

## 2022-11-11 DIAGNOSIS — Z131 Encounter for screening for diabetes mellitus: Secondary | ICD-10-CM

## 2022-11-11 LAB — CBC
HCT: 37.9 % (ref 36.0–46.0)
Hemoglobin: 12.4 g/dL (ref 12.0–15.0)
MCHC: 32.9 g/dL (ref 30.0–36.0)
MCV: 88 fl (ref 78.0–100.0)
Platelets: 334 10*3/uL (ref 150.0–400.0)
RBC: 4.3 Mil/uL (ref 3.87–5.11)
RDW: 14.9 % (ref 11.5–15.5)
WBC: 5.6 10*3/uL (ref 4.0–10.5)

## 2022-11-11 LAB — LIPID PANEL
Cholesterol: 205 mg/dL — ABNORMAL HIGH (ref 0–200)
HDL: 69.2 mg/dL (ref >39.00)
LDL Cholesterol: 116 mg/dL — ABNORMAL HIGH (ref 0–99)
NonHDL: 135.39
Total CHOL/HDL Ratio: 3
Triglycerides: 98 mg/dL (ref 0.0–149.0)
VLDL: 19.6 mg/dL (ref 0.0–40.0)

## 2022-11-11 LAB — COMPREHENSIVE METABOLIC PANEL
ALT: 13 U/L (ref 0–35)
AST: 15 U/L (ref 0–37)
Albumin: 4.2 g/dL (ref 3.5–5.2)
Alkaline Phosphatase: 59 U/L (ref 39–117)
BUN: 17 mg/dL (ref 6–23)
CO2: 23 mEq/L (ref 19–32)
Calcium: 9.4 mg/dL (ref 8.4–10.5)
Chloride: 103 mEq/L (ref 96–112)
Creatinine, Ser: 1.02 mg/dL (ref 0.40–1.20)
GFR: 58.56 mL/min — ABNORMAL LOW (ref >60.00)
Glucose, Bld: 113 mg/dL — ABNORMAL HIGH (ref 70–99)
Potassium: 4.3 mEq/L (ref 3.5–5.1)
Sodium: 133 mEq/L — ABNORMAL LOW (ref 135–145)
Total Bilirubin: 0.3 mg/dL (ref 0.2–1.2)
Total Protein: 6.9 g/dL (ref 6.0–8.3)

## 2022-11-11 LAB — TROPONIN I (HIGH SENSITIVITY): High Sens Troponin I: 3 ng/L (ref 2–17)

## 2022-11-11 LAB — HEMOGLOBIN A1C: Hgb A1c MFr Bld: 6.1 % (ref 4.6–6.5)

## 2022-11-11 LAB — TSH: TSH: 1.2 u[IU]/mL (ref 0.35–5.50)

## 2022-11-11 MED ORDER — IPRATROPIUM BROMIDE 0.03 % NA SOLN: 2.0000 | Freq: Three times a day (TID) | NASAL | 12 refills | Status: AC

## 2022-11-11 NOTE — Patient Instructions (Signed)
It was good to see you again today- I will be in touch with your labs and chest x-ray asap Take it easy physically until we get this figured out If you have recurrent symptoms that don't clear up with a brief period of rest please do go to the ER

## 2022-11-12 ENCOUNTER — Ambulatory Visit (INDEPENDENT_AMBULATORY_CARE_PROVIDER_SITE_OTHER): Payer: BC Managed Care – PPO

## 2022-11-12 ENCOUNTER — Encounter: Payer: Self-pay | Admitting: Family Medicine

## 2022-11-12 DIAGNOSIS — R0602 Shortness of breath: Secondary | ICD-10-CM | POA: Diagnosis not present

## 2022-11-12 DIAGNOSIS — R7989 Other specified abnormal findings of blood chemistry: Secondary | ICD-10-CM | POA: Diagnosis not present

## 2022-11-12 DIAGNOSIS — J9811 Atelectasis: Secondary | ICD-10-CM | POA: Diagnosis not present

## 2022-11-12 LAB — D-DIMER, QUANTITATIVE: D-Dimer, Quant: 0.54 mcg/mL FEU — ABNORMAL HIGH (ref <0.50)

## 2022-11-12 MED ORDER — IOHEXOL 350 MG/ML SOLN
100.0000 mL | Freq: Once | INTRAVENOUS | Status: AC | PRN
  Administered 2022-11-12: 100 mL via INTRAVENOUS

## 2022-11-13 ENCOUNTER — Encounter: Payer: Self-pay | Admitting: Cardiovascular Disease

## 2022-11-13 ENCOUNTER — Ambulatory Visit: Payer: BC Managed Care – PPO | Attending: Cardiovascular Disease | Admitting: Cardiovascular Disease

## 2022-11-13 ENCOUNTER — Ambulatory Visit (INDEPENDENT_AMBULATORY_CARE_PROVIDER_SITE_OTHER): Payer: BC Managed Care – PPO

## 2022-11-13 VITALS — BP 124/78 | HR 92 | Ht 66.0 in | Wt 197.8 lb

## 2022-11-13 DIAGNOSIS — R0602 Shortness of breath: Secondary | ICD-10-CM | POA: Diagnosis not present

## 2022-11-13 DIAGNOSIS — R5383 Other fatigue: Secondary | ICD-10-CM | POA: Diagnosis not present

## 2022-11-13 DIAGNOSIS — R002 Palpitations: Secondary | ICD-10-CM

## 2022-11-13 MED ORDER — METOPROLOL TARTRATE 100 MG PO TABS: 100.0000 mg | ORAL_TABLET | Freq: Once | ORAL | 0 refills | Status: DC

## 2022-11-13 NOTE — Patient Instructions (Signed)
Medication Instructions:  No changes *If you need a refill on your cardiac medications before your next appointment, please call your pharmacy*   Lab Work: none  Testing/Procedures: Your physician has requested that you have an echocardiogram. Echocardiography is a painless test that uses sound waves to create images of your heart. It provides your doctor with information about the size and shape of your heart and how well your heart's chambers and valves are working. This procedure takes approximately one hour. There are no restrictions for this procedure. Please do NOT wear cologne, perfume, aftershave, or lotions (deodorant is allowed). Please arrive 15 minutes prior to your appointment time.  Cardiac CTA - see instructions below  Zio Heart Monitor - applied today in office  Follow-Up: Based on test results    Your cardiac CT will be scheduled at  [facility] [place], [place] [address] [phone]  Please arrive at the [facility] (Entrance C2) of [facility] 30 minutes prior to test start time. You can use the FREE valet parking offered at entrance C (encouraged to control the heart rate for the test)  Proceed to the [facility] Radiology Department (first floor) to check-in and test prep.  All radiology patients and guests should use entrance C2 at [facility], accessed from [facility], even though the hospital's physical address listed is [address]     Please follow these instructions carefully (unless otherwise directed):   On the Night Before the Test: Be sure to Drink plenty of water. Do not consume any caffeinated/decaffeinated beverages or chocolate 12 hours prior to your test. Do not take any antihistamines 12 hours prior to your test. Delma Freeze)  On the Day of the Test: Drink plenty of water until 1 hour prior to the test. Do not eat any food 1 hour prior to  test. You may take your regular medications prior to the test.  Take metoprolol (Lopressor) two hours prior to test. FEMALES- please wear underwire-free bra if available, avoid dresses & tight clothing  After the Test: Drink plenty of water. After receiving IV contrast, you may experience a mild flushed feeling. This is normal. On occasion, you may experience a mild rash up to 24 hours after the test. This is not dangerous. If this occurs, you can take Benadryl 25 mg and increase your fluid intake. If you experience trouble breathing, this can be serious. If it is severe call 911 IMMEDIATELY. If it is mild, please call our office. If you take any of these medications: Glipizide/Metformin, Avandament, Glucavance, please do not take 48 hours after completing test unless otherwise instructed.  We will call to schedule your test 2-4 weeks out understanding that some insurance companies will need an authorization prior to the service being performed.   For non-scheduling related questions, please contact the cardiac imaging nurse navigator should you have any questions/concerns: Marchia Bond, Cardiac Imaging Nurse Navigator Gordy Clement, Cardiac Imaging Nurse Navigator [facility] Direct Office Dial: [phone]   For scheduling needs, including cancellations and rescheduling, please call [place], [phone].  ZIO XT- Long Term Monitor Instructions  Your physician has requested you wear a ZIO patch monitor for 14 days.  This is a single patch monitor. [employer] supplies one patch monitor per enrollment. Additional stickers are not available. Please do not apply patch if you will be having a Nuclear Stress Test,  Echocardiogram, Cardiac CT, MRI, or Chest Xray during  the period you would be wearing the  monitor. The patch cannot be worn during these tests. You cannot remove and re-apply the  ZIO XT patch monitor.  Your ZIO patch monitor will be mailed 3 day USPS  to your address on file. It may take 3-5 days  to receive your monitor after you have been enrolled.  Once you have received your monitor, please review the enclosed instructions. Your monitor  has already been registered assigning a specific monitor serial # to you.  Billing and Patient Assistance Program Information  We have supplied [employer] with any of your insurance information on file for billing purposes. [employer] offers a sliding scale Patient Assistance Program for patients that do not have  insurance, or whose insurance does not completely cover the cost of the ZIO monitor.  You must apply for the Patient Assistance Program to qualify for this discounted rate.  To apply, please call [employer] at [phone], select option 4, select option 2, ask to apply for  Patient Assistance Program. Theodore Demark will ask your household income, and how many people  are in your household. They will quote your out-of-pocket cost based on that information.  [employer] will also be able to set up a 80-month, interest-free payment plan if needed.  Applying the monitor   Shave hair from upper left chest.  Hold abrader disc by orange tab. Rub abrader in 40 strokes over the upper left chest as  indicated in your monitor instructions.  Clean area with 4 enclosed alcohol pads. Let dry.  Apply patch as indicated in monitor instructions. Patch will be placed under collarbone on left  side of chest with arrow pointing upward.  Rub patch adhesive wings for 2 minutes. Remove white label marked "1". Remove the white  label marked "2". Rub patch adhesive wings for 2 additional minutes.  While looking in a mirror, press and release button in center of patch. A small green light will  flash 3-4 times. This will be your only indicator that the monitor has been turned on.  Do not shower for the first 24 hours. You may shower after the first 24 hours.  Press the button if you feel a symptom. You will hear a small click.  Record Date, Time and  Symptom in the Patient Logbook.  When you are ready to remove the patch, follow instructions on the last 2 pages of Patient  Logbook. Stick patch monitor onto the last page of Patient Logbook.  Place Patient Logbook in the blue and white box. Use locking tab on box and tape box closed  securely. The blue and white box has prepaid postage on it. Please place it in the mailbox as  soon as possible. Your physician should have your test results approximately 7 days after the  monitor has been mailed back to [facility].  Call [place] at [phone] if you have questions regarding  your ZIO XT patch monitor. Call them immediately if you see an orange light blinking on your  monitor.  If your monitor falls off in less than 4 days, contact our Monitor department at [phone].  If your monitor becomes loose or falls off after 4 days call [employer] at [phone] for  suggestions on securing your monitor

## 2022-11-13 NOTE — Progress Notes (Signed)
Chief Complaint  Patient presents with   New Patient (Initial Visit)    dizziness   History of Present Illness: 62 yo female with history of depression, GERD, hyperlipidemia, possible ulcerative colitis and prior TIA in 2019 who is here today as a new consult, referred by Dr. Lorelei Pont, for the evaluation of exercise intolerance, dizziness, diaphoresis and nausea. Echo in October 2019 with normal LV function and no significant valve disease. Cardiac monitor in 2019 with sinus. She tells me today that she has been having exertional dizziness with nausea and fatigue. She was seen this week by Dr. Lorelei Pont. Chest CTA with no evidence of PE but there was coronary calcification noted. TSH normal last week.   She tells me today that she continues to have exertional fatigue, dyspnea and diaphoresis. No chest pain. She was hanging mini blinds last week and felt very hot, dizzy and nauseous and then vomited. Any exertion over the past week has made her feel poorly. She feels her heart racing at times.   Primary Care Physician: Darreld Mclean, MD   Past Medical History:  Diagnosis Date   Arthritis    Right Shoulder, Feet, Fingers, Back, Neck   Bleeding nose    History of    Depression    Frequent urination    GERD (gastroesophageal reflux disease)    Gestational diabetes    Heart murmur    echo 10/20/2011   Hole in the ear drum, right    Hyperlipidemia    Minimal   Menopausal syndrome    Migraine without aura    Botox injection 10/21/2017   OA (osteoarthritis) of knee    Obese    Plantar warts    PMB (postmenopausal bleeding)    Pneumonia    history of    PONV (postoperative nausea and vomiting)    Postop Acute blood loss anemia 11/05/2011   TIA (transient ischemic attack) 06/05/2018   Ulcerative colitis ([place])    Urgency of urination    Vertigo     Past Surgical History:  Procedure Laterality Date   BREAST BIOPSY Left    COLONOSCOPY     ear drum patched Right    KNEE  ARTHROSCOPY  2005   MOUTH SURGERY     gum surgery   skull fracture surgery     calcinatied bone from [place]  11/02/2011   Procedure: TOTAL KNEE ARTHROPLASTY;  Surgeon: [name], MD;  Location: [facility];  Service: Orthopedics;  Laterality: Left;   TOTAL KNEE ARTHROPLASTY Right 10/25/2017   Procedure: RIGHT TOTAL KNEE ARTHROPLASTY;  Surgeon: Gaynelle Arabian, MD;  Location: [facility];  Service: Orthopedics;  Laterality: Right;    Current Outpatient Medications  Medication Sig Dispense Refill   aspirin EC 81 MG tablet Take 1 tablet (81 mg total) by mouth daily.     atomoxetine (STRATTERA) 100 MG capsule Take 100 mg by mouth daily.     baclofen (LIORESAL) 10 MG tablet Take 10 mg by mouth 3 (three) times daily.     cetirizine (ZYRTEC) 10 MG tablet Take 10 mg by mouth at bedtime.     fexofenadine (ALLEGRA) 180 MG tablet Take 180 mg by mouth daily.     ipratropium (ATROVENT) 0.03 % nasal spray Place 2 sprays into both nostrils 3 (three) times daily. Use as needed for nasal drainage and cough 30 mL 12   lisinopril (ZESTRIL) 10 MG tablet TAKE 1 TABLET  BY MOUTH EVERY DAY 90 tablet 3   metoprolol tartrate (LOPRESSOR) 100 MG tablet Take 1 tablet (100 mg total) by mouth once for 1 dose. Take 90-120 minutes prior to scan. 1 tablet 0   Nutritional Supplements (JUICE PLUS FIBRE PO) Take 4 capsules by mouth daily. 2 capsule of fruit and 2 capsules of veggies     omeprazole (PRILOSEC OTC) 20 MG tablet Take 20 mg by mouth daily.     ondansetron (ZOFRAN) 4 MG tablet Take 1 tablet (4 mg total) by mouth every 8 (eight) hours as needed for nausea or vomiting. 20 tablet 0   oxybutynin (DITROPAN-XL) 10 MG 24 hr tablet TAKE 1 TABLET BY MOUTH EVERY MORNING AND 2 TABLETS EVERY EVENING 270 tablet 3   topiramate (TOPAMAX) 50 MG tablet Take 50 mg by mouth daily in the afternoon.     No current facility-administered medications for this visit.    Allergies  Allergen  Reactions   Codeine Nausea And Vomiting   Onion Other (See Comments)    Headache     Social History   Socioeconomic History   Marital status: Divorced    Spouse name: Not on file   Number of children: 1   Years of education: Not on file   Highest education level: Bachelor's degree (e.g., BA, AB, BS)  Occupational History    Comment: Tourist information centre manager   Occupation: She works for [employer] (she works from home)  Tobacco Use   Smoking status: Never   Smokeless tobacco: Never  Vaping Use   Vaping Use: Never used  Substance and Sexual Activity   Alcohol use: Yes    Comment: occassionally   Drug use: No   Sexual activity: Not Currently    Birth control/protection: Post-menopausal  Other Topics Concern   Not on file  Social History Narrative   Lives alone   Caffeine- coffee, 2-3 cups daily   Social Determinants of Health   Financial Resource Strain: Low Risk  (11/10/2022)   Overall Financial Resource Strain (CARDIA)    Difficulty of Paying Living Expenses: Not very hard  Food Insecurity: No Food Insecurity (11/10/2022)   Hunger Vital Sign    Worried About Running Out of Food in the Last Year: Never true    Farmer in the Last Year: Never true  Transportation Needs: No Transportation Needs (11/10/2022)   PRAPARE - Hydrologist (Medical): No    Lack of Transportation (Non-Medical): No  Physical Activity: Unknown (11/10/2022)   Exercise Vital Sign    Days of Exercise per Week: 0 days    Minutes of Exercise per Session: Not on file  Stress: No Stress Concern Present (11/10/2022)   [place]    Feeling of Stress : Only a little  Social Connections: Moderately Integrated (11/10/2022)   Social Connection and Isolation Panel [NHANES]    Frequency of Communication with Friends and Family: More than three times a week    Frequency of Social Gatherings with  Friends and Family: Twice a week    Attends Religious Services: More than 4 times per year    Active Member of [employer] or Organizations: Yes    Attends Music therapist: More than 4 times per year    Marital Status: Divorced  Human resources officer Violence: Not on file    Family History  Problem Relation Age of Onset   Heart disease Mother  mitral valve prolapse   Lung cancer Father    Thyroid disease Father    Stroke Father    Thyroid disease Sister    Heart disease Brother    Heart disease Maternal Grandfather    Heart disease Paternal Grandfather    Heart disease Maternal Aunt    Diabetes Maternal Aunt    Heart disease Maternal Uncle    Heart disease Maternal Uncle    Heart disease Maternal Uncle    Heart disease Maternal Uncle    Thyroid disease Paternal Aunt    Colon cancer Neg Hx     Review of Systems:  As stated in the HPI and otherwise negative.   BP 124/78   Pulse 92   Ht 5\' 6"  (1.676 m)   Wt 89.7 kg   LMP 11/02/2011   SpO2 99%   BMI 31.93 kg/m   Physical Examination: General: Well developed, well nourished, NAD  HEENT: OP clear, mucus membranes moist  SKIN: warm, dry. No rashes. Neuro: No focal deficits  Musculoskeletal: Muscle strength 5/5 all ext  Psychiatric: Mood and affect normal  Neck: No JVD, no carotid bruits, no thyromegaly, no lymphadenopathy.  Lungs:Clear bilaterally, no wheezes, rhonci, crackles Cardiovascular: Regular rate and rhythm. No murmurs, gallops or rubs. Abdomen:Soft. Bowel sounds present. Non-tender.  Extremities: No lower extremity edema. Pulses are 2 + in the bilateral DP/PT.  EKG:  EKG is not ordered today. The ekg from 11/11/22 shows sinus. I personally reviewed this EKG  Echo October 2019: Left ventricle: The cavity size was normal. Systolic function was    normal. The estimated ejection fraction was in the range of 60%    to 65%. Wall motion was normal; there were no regional wall    motion abnormalities.  There was an increased relative    contribution of atrial contraction to ventricular filling.    Doppler parameters are consistent with abnormal left ventricular    relaxation (grade 1 diastolic dysfunction).  - Aortic valve: Trileaflet; moderately thickened, moderately    calcified leaflets.  - Mitral valve: There was trivial regurgitation. Peak E/A ratio:    0.8.  - Pulmonary arteries: Systolic pressure could not be accurately    estimated.   Recent Labs: 11/11/2022: ALT 13; BUN 17; Creatinine, Ser 1.02; Hemoglobin 12.4; Platelets 334.0; Potassium 4.3; Sodium 133; TSH 1.20   Lipid Panel    Component Value Date/Time   CHOL 205 (H) 11/11/2022 1457   TRIG 98.0 11/11/2022 1457   HDL 69.20 11/11/2022 1457   CHOLHDL 3 11/11/2022 1457   VLDL 19.6 11/11/2022 1457   LDLCALC 116 (H) 11/11/2022 1457     Wt Readings from Last 3 Encounters:  11/13/22 89.7 kg  11/11/22 90.1 kg  08/19/22 87 kg    Assessment and Plan:   1. Dyspnea/Fatigue: Will arrange a coronary CTA to exclude CAD. BMET normal this week. Echo to assess LVEF and exclude valvular heart disease.   2. Palpitations: Will arrange a 14 day Zio cardiac monitor.   Labs/ tests ordered today include:   Orders Placed This Encounter  Procedures   CT CORONARY MORPH W/CTA COR W/SCORE W/CA W/CM &/OR WO/CM   LONG TERM MONITOR (3-14 DAYS)   ECHOCARDIOGRAM COMPLETE     Disposition:   F/U with me will be planned after her testing    Signed, Lauree Chandler, MD, [facility] 11/13/2022 12:20 PM    [facility] [address], [place], [place]  [address] Phone: [phone]; Fax: [phone]    

## 2022-11-13 NOTE — Progress Notes (Unsigned)
Applied a 14 day Zio XT monitor to patient in the office ?

## 2022-11-24 ENCOUNTER — Telehealth (HOSPITAL_COMMUNITY): Payer: Self-pay | Admitting: *Deleted

## 2022-11-24 NOTE — Telephone Encounter (Signed)
Reaching out to patient to offer assistance regarding upcoming cardiac imaging study; pt verbalizes understanding of appt date/time, parking situation and where to check in, pre-test NPO status and medications ordered, and verified current allergies; name and call back number provided for further questions should they arise  [facility] RN Navigator Cardiac Imaging [facility] [phone] office [phone] cell  Patient to take 100mg metoprolol tartrate two hours prior to her cardiac CT scan.  She is aware to arrive at 8:30am.   

## 2022-11-25 ENCOUNTER — Ambulatory Visit (HOSPITAL_COMMUNITY)
Admission: RE | Admit: 2022-11-25 | Discharge: 2022-11-25 | Disposition: A | Discharge status: Home / Self Care | Payer: BC Managed Care – PPO | Source: Ambulatory Visit | Attending: Cardiovascular Disease | Admitting: Cardiovascular Disease

## 2022-11-25 DIAGNOSIS — R0602 Shortness of breath: Secondary | ICD-10-CM | POA: Diagnosis not present

## 2022-11-25 DIAGNOSIS — R002 Palpitations: Secondary | ICD-10-CM | POA: Diagnosis not present

## 2022-11-25 DIAGNOSIS — R5383 Other fatigue: Secondary | ICD-10-CM | POA: Insufficient documentation

## 2022-11-25 DIAGNOSIS — I700 Atherosclerosis of aorta: Secondary | ICD-10-CM | POA: Diagnosis not present

## 2022-11-25 MED ORDER — IOHEXOL 350 MG/ML SOLN
100.0000 mL | Freq: Once | INTRAVENOUS | Status: AC | PRN
  Administered 2022-11-25: 100 mL via INTRAVENOUS

## 2022-11-25 MED ORDER — NITROGLYCERIN 0.4 MG SL SUBL
0.8000 mg | SUBLINGUAL_TABLET | Freq: Once | SUBLINGUAL | Status: AC
  Administered 2022-11-25: 0.8 mg via SUBLINGUAL

## 2022-11-25 MED ORDER — NITROGLYCERIN 0.4 MG SL SUBL
SUBLINGUAL_TABLET | SUBLINGUAL | Status: AC
  Filled 2022-11-25: qty 2

## 2022-12-03 DIAGNOSIS — R002 Palpitations: Secondary | ICD-10-CM | POA: Diagnosis not present

## 2022-12-08 ENCOUNTER — Ambulatory Visit: Payer: BC Managed Care – PPO | Admitting: Cardiology

## 2022-12-14 ENCOUNTER — Telehealth: Payer: Self-pay | Admitting: Cardiovascular Disease

## 2022-12-14 DIAGNOSIS — I2510 Atherosclerotic heart disease of native coronary artery without angina pectoris: Secondary | ICD-10-CM

## 2022-12-14 MED ORDER — ROSUVASTATIN CALCIUM 10 MG PO TABS: 10.0000 mg | ORAL_TABLET | Freq: Every day | ORAL | 3 refills | Status: DC

## 2022-12-14 NOTE — Telephone Encounter (Signed)
Called and spoke with patient about results. She agrees to start Crestor. Medication sent to pharmacy on file and labs scheduled for 03/23/23. No further questions.

## 2022-12-14 NOTE — Telephone Encounter (Signed)
-----   Message from Kathleene Hazel, MD sent at 11/29/2022  1:57 PM EDT ----- She has evidence of mild CAD. Minimal blockages but there is coronary plaque. Continue ASA. I would recommend that she start Crestor 10 mg daily and have lipids and LFTs checked in 12 weeks. Also small sub pleural nodules, likely benign. Repeat chest CT in one year to follow nodules. Thayer Ohm

## 2022-12-16 ENCOUNTER — Ambulatory Visit (HOSPITAL_COMMUNITY): Payer: BC Managed Care – PPO | Attending: Cardiovascular Disease

## 2022-12-16 DIAGNOSIS — R5383 Other fatigue: Secondary | ICD-10-CM | POA: Insufficient documentation

## 2022-12-16 DIAGNOSIS — R002 Palpitations: Secondary | ICD-10-CM | POA: Insufficient documentation

## 2022-12-16 DIAGNOSIS — R0602 Shortness of breath: Secondary | ICD-10-CM | POA: Diagnosis not present

## 2022-12-16 LAB — ECHOCARDIOGRAM COMPLETE
Area-P 1/2: 4.54 cm2
S' Lateral: 2.7 cm

## 2023-01-11 DIAGNOSIS — G43009 Migraine without aura, not intractable, without status migrainosus: Secondary | ICD-10-CM | POA: Diagnosis not present

## 2023-01-28 ENCOUNTER — Encounter: Payer: Self-pay | Admitting: Family Medicine

## 2023-01-28 DIAGNOSIS — N3941 Urge incontinence: Secondary | ICD-10-CM

## 2023-01-29 MED ORDER — OXYBUTYNIN CHLORIDE ER 10 MG PO TB24: ORAL_TABLET | ORAL | 3 refills | Status: DC

## 2023-02-01 ENCOUNTER — Encounter (INDEPENDENT_AMBULATORY_CARE_PROVIDER_SITE_OTHER): Payer: BC Managed Care – PPO | Admitting: Family Medicine

## 2023-02-01 DIAGNOSIS — I10 Essential (primary) hypertension: Secondary | ICD-10-CM | POA: Diagnosis not present

## 2023-02-01 NOTE — Telephone Encounter (Signed)
Please see the MyChart message reply(ies) for my assessment and plan.  The patient gave consent for this Medical Advice Message and is aware that it may result in a bill to their insurance company as well as the possibility that this may result in a co-payment or deductible. They are an established patient, but are not seeking medical advice exclusively about a problem treated during an in person or video visit in the last 7 days. I did not recommend an in person or video visit within 7 days of my reply.  I spent a total of 10 minutes cumulative time within 7 days through MyChart messaging Sigfredo Schreier, MD  

## 2023-02-27 ENCOUNTER — Encounter: Payer: Self-pay | Admitting: Family Medicine

## 2023-03-01 MED ORDER — OXYBUTYNIN CHLORIDE ER 10 MG PO TB24: ORAL_TABLET | ORAL | 3 refills | Status: DC

## 2023-03-01 NOTE — Addendum Note (Signed)
Addended by: Abbe Amsterdam C on: 03/01/2023 01:34 AM   Modules accepted: Orders

## 2023-03-14 NOTE — Progress Notes (Addendum)
[facility] [address], [address] [place], [place] [address] [phone]  Date:  03/18/2023   Name:  [name]   DOB:  February 11, 1961   MRN:  [id number]  PCP:  Pearline Cables, MD    Chief Complaint: Nausea (Recurring issues. Extreme heat spells, nausea, dizziness, headache and needs to lay down. /Pt states after a shower she's really "tired" /Pt wonders if its anything to do with blood sugar /Pt is no longer taking Strattera )   History of Present Illness:  [name] is a 62 y.o. very pleasant female patient who presents with the following:  Patient seen today with concern of "spells" where she will feel very hot and weak  Most recent visit with myself was in March of this year-at that time she also noted she felt abnormally hot after mild exercise.  We are concerned about a possible cardiac etiology, she was seen by cardiology just 2 days later, Dr. Joan Mayans ordered a coronary CTA, echo, 14-day Zio She did have a high coronary calcium score, ZIO showed a few runs of SVT, echo mild diastolic dysfunction IMPRESSION: 1. Coronary calcium score of 355. This was 95 percentile for age and sex matched control. 2. Total plaque volume (TPV) 285 mm3 which is 72 percentile for age-and sex matched controls (calcified plaque 81 mm3; non-calcified plaque 204 mm3). TPV is severe. 3. Normal coronary origin with right dominance. 4. Minimal non obstructive CAD of LM and LAD.  More recently she was seen by her neurologist, Dr. Terrial Rhodes with [facility] regarding her migraine headaches  She stopped taking Strattera and does feel that this has decreased her sx She tried stimulants for ADHD but also could not tolerate She notes that when she tries to cut our sugar - eating more carbs and glucose does seem to help   When she has these episodes she will feel very hot, esp in her face and neck She was having these very frequently- "3-4x a day,  I could not leave the house." Looking back she notes she may have had some similar sx when she had "low blood sugar" - would feel vertigo, would get better with eating She has not actually checked her blood sugar, but with we will get a feeling like it is low  She is trying to stay well hydrated and drink more electrolytes The heat, being dehydrated makes her worse   She is still working full time- she does work from home which helps her keep up with things  She notes she may feel fine in the am,but as soon as she does any activity at all she will feel tired.  She will have the "hot" spells with more intense exertion such as taking out the trash   She took a short course or prednisone during this time period - it made her feel esp bad so she stopped using it  Patient Active Problem List   Diagnosis Date Noted   OA (osteoarthritis) of knee 10/25/2017   Ulcerative colitis, unspecified  see GI note Dr. Vernell Barrier 02/2007 05/13/2014   Post-menopausal bleeding  evalutated  Dr. Langston Masker 03/13/2014   Blood in stool 03/13/2014   Fibroadenoma of left breast 04/10/2013   ANA positive 03/20/2013   Elevated transaminase measurement 03/15/2013   Vaginal spotting 12/08/2012   Abnormal Pap smear 10/13/2011   Cardiac murmur 10/13/2011   GERD (gastroesophageal reflux disease) 10/13/2011   Urticaria 10/13/2011  Arthritis    Migraine    Depression    Menopausal syndrome     Past Medical History:  Diagnosis Date   Arthritis    Right Shoulder, Feet, Fingers, Back, Neck   Bleeding nose    History of    Depression    Frequent urination    GERD (gastroesophageal reflux disease)    Gestational diabetes    Heart murmur    echo 10/20/2011   Hole in the ear drum, right    Hyperlipidemia    Minimal   Menopausal syndrome    Migraine without aura    Botox injection 10/21/2017   OA (osteoarthritis) of knee    Obese    Plantar warts    PMB (postmenopausal bleeding)    Pneumonia    history of    PONV  (postoperative nausea and vomiting)    Postop Acute blood loss anemia 11/05/2011   TIA (transient ischemic attack) 06/05/2018   Ulcerative colitis (HCC)    Urgency of urination    Vertigo     Past Surgical History:  Procedure Laterality Date   BREAST BIOPSY Left    COLONOSCOPY     ear drum patched Right    KNEE ARTHROSCOPY  2005   MOUTH SURGERY     gum surgery   skull fracture surgery     calcinatied bone from forehead   TONSILLECTOMY  1979   TOTAL KNEE ARTHROPLASTY  11/02/2011   Procedure: TOTAL KNEE ARTHROPLASTY;  Surgeon: Loanne Drilling, MD;  Location: [facility];  Service: Orthopedics;  Laterality: Left;   TOTAL KNEE ARTHROPLASTY Right 10/25/2017   Procedure: RIGHT TOTAL KNEE ARTHROPLASTY;  Surgeon: Ollen Gross, MD;  Location: [facility];  Service: Orthopedics;  Laterality: Right;    Social History   Tobacco Use   Smoking status: Never   Smokeless tobacco: Never  Vaping Use   Vaping status: Never Used  Substance Use Topics   Alcohol use: Yes    Comment: occassionally   Drug use: No    Family History  Problem Relation Age of Onset   Heart disease Mother        mitral valve prolapse   Lung cancer Father    Thyroid disease Father    Stroke Father    Thyroid disease Sister    Heart disease Brother    Heart disease Maternal Grandfather    Heart disease Paternal Grandfather    Heart disease Maternal Aunt    Diabetes Maternal Aunt    Heart disease Maternal Uncle    Heart disease Maternal Uncle    Heart disease Maternal Uncle    Heart disease Maternal Uncle    Thyroid disease Paternal Aunt    Colon cancer Neg Hx     Allergies  Allergen Reactions   Codeine Nausea And Vomiting   Onion Other (See Comments)    Headache     Medication list has been reviewed and updated.  Current Outpatient Medications on File Prior to Visit  Medication Sig Dispense Refill   aspirin EC 81 MG tablet Take 1 tablet (81 mg total) by mouth daily.     baclofen (LIORESAL) 10 MG tablet  Take 10 mg by mouth 2 (two) times daily. As needed "one at night"     fexofenadine (ALLEGRA) 180 MG tablet Take 180 mg by mouth daily.     ipratropium (ATROVENT) 0.03 % nasal spray Place 2 sprays into both nostrils 3 (three) times daily. Use as needed for nasal drainage and  cough 30 mL 12   lisinopril (ZESTRIL) 10 MG tablet TAKE 1 TABLET BY MOUTH EVERY DAY 90 tablet 3   Nutritional Supplements (JUICE PLUS FIBRE PO) Take 4 capsules by mouth daily. 2 capsule of fruit and 2 capsules of veggies     omeprazole (PRILOSEC OTC) 20 MG tablet Take 20 mg by mouth daily.     ondansetron (ZOFRAN) 4 MG tablet Take 1 tablet (4 mg total) by mouth every 8 (eight) hours as needed for nausea or vomiting. 20 tablet 0   oxybutynin (DITROPAN-XL) 10 MG 24 hr tablet TAKE 2 TABLETS EVERY EVENING BY MOUTH 180 tablet 3   rosuvastatin (CRESTOR) 10 MG tablet Take 1 tablet (10 mg total) by mouth daily. 90 tablet 3   topiramate (TOPAMAX) 50 MG tablet Take 50 mg by mouth daily in the afternoon.     atomoxetine (STRATTERA) 100 MG capsule Take 100 mg by mouth daily.     cetirizine (ZYRTEC) 10 MG tablet Take 10 mg by mouth at bedtime.     No current facility-administered medications on file prior to visit.    Review of Systems:  As per HPI- otherwise negative.   Physical Examination: Vitals:   03/18/23 1539  BP: 122/76  Pulse: 89  SpO2: 99%   Vitals:   03/18/23 1539  Weight: 198 lb 6.4 oz (90 kg)  Height: 5\' 6"  (1.676 m)   Body mass index is 32.02 kg/m. Ideal Body Weight: Weight in (lb) to have BMI = 25: 154.6  GEN: no acute distress.  Mildly obese, looks well HEENT: Atraumatic, Normocephalic.  Ears and Nose: No external deformity. CV: RRR, No M/G/R. No JVD. No thrill. No extra heart sounds. PULM: CTA B, no wheezes, crackles, rhonchi. No retractions. No resp. distress. No accessory muscle use. ABD: S, NT, ND, +BS. No rebound. No HSM. EXTR: No c/c/e PSYCH: Normally interactive. Conversant.   Borderline  positive orthostatic testing  Assessment and Plan: Screening for thyroid disorder - Plan: TSH  Essential hypertension - Plan: CBC, Comprehensive metabolic panel  Prediabetes - Plan: Hemoglobin A1c  Patient seen today with concern of "spells" as above, she notes she will seem to overheat with even mild physical activity, may have times when she feels lightheaded and exhausted.  She does have some coronary disease noted on CT coronary calcium, she is seeing cardiology soon for follow-up.  I will touch base with Dr. Clifton James and see if he thinks cardiac cath might be indicated  Otherwise, discussed possible symptoms of orthostatic hypotension.  I encouraged her to increase fluids, increase salt intake, try to eat frequently to keep stable blood sugar She is taking lisinopril 10, could consider stopping this  Follow-up on lab work as above Signed Abbe Amsterdam, MD  Received labs as below, message to patient  Results for orders placed or performed in visit on 03/18/23  CBC  Result Value Ref Range   WBC 4.8 4.0 - 10.5 K/uL   RBC 4.32 3.87 - 5.11 Mil/uL   Platelets 313.0 150.0 - 400.0 K/uL   Hemoglobin 12.3 12.0 - 15.0 g/dL   HCT 11.9 14.7 - 82.9 %   MCV 88.3 78.0 - 100.0 fl   MCHC 32.1 30.0 - 36.0 g/dL   RDW 56.2 13.0 - 86.5 %  Comprehensive metabolic panel  Result Value Ref Range   Sodium 136 135 - 145 mEq/L   Potassium 4.7 3.5 - 5.1 mEq/L   Chloride 103 96 - 112 mEq/L   CO2 24 19 -  32 mEq/L   Glucose, Bld 92 70 - 99 mg/dL   BUN 15 6 - 23 mg/dL   Creatinine, Ser 0.86 0.40 - 1.20 mg/dL   Total Bilirubin 0.3 0.2 - 1.2 mg/dL   Alkaline Phosphatase 62 39 - 117 U/L   AST 16 0 - 37 U/L   ALT 13 0 - 35 U/L   Total Protein 6.7 6.0 - 8.3 g/dL   Albumin 4.4 3.5 - 5.2 g/dL   GFR 57.84 (L) >69.62 mL/min   Calcium 9.6 8.4 - 10.5 mg/dL  Hemoglobin X5M  Result Value Ref Range   Hgb A1c MFr Bld 6.2 4.6 - 6.5 %  TSH  Result Value Ref Range   TSH 0.87 0.35 - 5.50 uIU/mL

## 2023-03-18 ENCOUNTER — Encounter: Payer: Self-pay | Admitting: Family Medicine

## 2023-03-18 ENCOUNTER — Ambulatory Visit: Payer: BC Managed Care – PPO | Admitting: Family Medicine

## 2023-03-18 VITALS — BP 122/76 | HR 89 | Ht 66.0 in | Wt 198.4 lb

## 2023-03-18 DIAGNOSIS — R7303 Prediabetes: Secondary | ICD-10-CM | POA: Diagnosis not present

## 2023-03-18 DIAGNOSIS — Z1329 Encounter for screening for other suspected endocrine disorder: Secondary | ICD-10-CM | POA: Diagnosis not present

## 2023-03-18 DIAGNOSIS — I10 Essential (primary) hypertension: Secondary | ICD-10-CM

## 2023-03-18 NOTE — Patient Instructions (Signed)
It was good to see you today- I do think checking your blood sugar sugar when you are feeling bad may be helpful- let me know what kind of numbers you are getting I will let Dr Clifton James know that you are still having these sx- I am not sure if a cardiac cath might be indicated?   Try to keep your blood sugar even, and drink plenty of fluids and get adequate salt intake   I will be in touch with your labs asap

## 2023-03-19 ENCOUNTER — Encounter: Payer: Self-pay | Admitting: Family Medicine

## 2023-03-19 DIAGNOSIS — R7303 Prediabetes: Secondary | ICD-10-CM | POA: Insufficient documentation

## 2023-03-19 NOTE — Addendum Note (Signed)
Addended by: Abbe Amsterdam C on: 03/19/2023 12:01 PM   Modules accepted: Orders

## 2023-03-23 ENCOUNTER — Ambulatory Visit: Payer: BC Managed Care – PPO

## 2023-04-06 ENCOUNTER — Ambulatory Visit: Payer: BC Managed Care – PPO | Attending: Cardiovascular Disease

## 2023-04-06 DIAGNOSIS — I2510 Atherosclerotic heart disease of native coronary artery without angina pectoris: Secondary | ICD-10-CM | POA: Diagnosis not present

## 2023-04-06 LAB — HEPATIC FUNCTION PANEL
ALT: 14 IU/L (ref 0–32)
AST: 17 IU/L (ref 0–40)
Albumin: 4.4 g/dL (ref 3.9–4.9)
Alkaline Phosphatase: 76 IU/L (ref 44–121)
Bilirubin Total: 0.3 mg/dL (ref 0.0–1.2)
Bilirubin, Direct: 0.14 mg/dL (ref 0.00–0.40)
Total Protein: 6.5 g/dL (ref 6.0–8.5)

## 2023-04-06 LAB — LIPID PANEL
Chol/HDL Ratio: 2 ratio (ref 0.0–4.4)
Cholesterol, Total: 155 mg/dL (ref 100–199)
HDL: 78 mg/dL (ref >39)
LDL Chol Calc (NIH): 66 mg/dL (ref 0–99)
Triglycerides: 53 mg/dL (ref 0–149)
VLDL Cholesterol Cal: 11 mg/dL (ref 5–40)

## 2023-04-15 NOTE — Progress Notes (Deleted)
Cardiology Office Note    Date:  04/15/2023  ID:  Danielle, Benson 29-Aug-1960, MRN [id number] PCP:  Pearline Cables, MD  Cardiologist:  Verne Carrow, MD  Electrophysiologist:  None   Chief Complaint: ***  History of Present Illness: .    [name] is a 62 y.o. female with visit-pertinent history of nonobstructive CAD by cor CT 11/2022, mild bilateral carotid disease (1-39% BICA 2019), migraines, pulmonary nodules, PSVT, depression, GERD, hyperlipidemia, possible ulcerative colitis, TIA in 2019 seen for follow-up. She established care with Dr. Clifton James in 10/2022 for evaluation of exercise intolerance, dizziness, diaphoresis and nausea. She'd had prior workup in 2019 showing mild carotid disease, normal LVEF by echo, and monitor with NSR with <1% PACs/PVCs and rare PSVT. Chest CT in 11/2022 picked up coronary calcification as well as subpleural pulmonary nodules. Dr. Clifton James ordered echo, cor CT and monitor. Echo 11/2022 showed EF 55-60%, G1DD. Cor CT showed CAC 355, 95%ile, minimal nonobstructive CAD of LM/LAD. Repeat monitor showed NSR, 16 PSVT runs (longest 12 beats), rare PACs/PVCs, average 87bpm. Diltiazem offered and patient did not wish to start another medicine at this time.  Carotid study  CAD PSVT Hyperlipidemia Mild bilateral carotid disease  Labwork independently reviewed: 03/2023 LDL 66, LFTS wnl 02/2023 TSH, A1c wnl, CBC wnl, A1c 6.2  ROS: .    Please see the history of present illness. Otherwise, review of systems is positive for ***.  All other systems are reviewed and otherwise negative.  Studies Reviewed: Marland Kitchen    EKG:  EKG is ordered today, personally reviewed, demonstrating ***  CV Studies: Cardiac studies reviewed are outlined and summarized above. Otherwise please see EMR for full report.   Current Reported Medications:.    No outpatient medications have been marked as taking for the 04/19/23 encounter (Appointment) with Laurann Montana, PA-C.     Physical Exam:    VS:  LMP 11/02/2011    Wt Readings from Last 3 Encounters:  03/18/23 198 lb 6.4 oz (90 kg)  11/13/22 197 lb 12.8 oz (89.7 kg)  11/11/22 198 lb 9.6 oz (90.1 kg)    GEN: Well nourished, well developed in no acute distress NECK: No JVD; No carotid bruits CARDIAC: ***RRR, no murmurs, rubs, gallops RESPIRATORY:  Clear to auscultation without rales, wheezing or rhonchi  ABDOMEN: Soft, non-tender, non-distended EXTREMITIES:  No edema; No acute deformity   Asessement and Plan:.     ***     Disposition: F/u with ***  Signed, Laurann Montana, PA-C

## 2023-04-19 ENCOUNTER — Ambulatory Visit: Payer: BC Managed Care – PPO | Admitting: Physician Assistant

## 2023-04-19 DIAGNOSIS — I471 Supraventricular tachycardia, unspecified: Secondary | ICD-10-CM

## 2023-04-19 DIAGNOSIS — I2510 Atherosclerotic heart disease of native coronary artery without angina pectoris: Secondary | ICD-10-CM

## 2023-04-19 DIAGNOSIS — I779 Disorder of arteries and arterioles, unspecified: Secondary | ICD-10-CM

## 2023-04-19 DIAGNOSIS — E785 Hyperlipidemia, unspecified: Secondary | ICD-10-CM

## 2023-04-27 DIAGNOSIS — F900 Attention-deficit hyperactivity disorder, predominantly inattentive type: Secondary | ICD-10-CM | POA: Diagnosis not present

## 2023-04-27 DIAGNOSIS — F411 Generalized anxiety disorder: Secondary | ICD-10-CM | POA: Diagnosis not present

## 2023-04-27 DIAGNOSIS — F4321 Adjustment disorder with depressed mood: Secondary | ICD-10-CM | POA: Diagnosis not present

## 2023-05-04 ENCOUNTER — Encounter: Payer: Self-pay | Admitting: Family Medicine

## 2023-05-07 ENCOUNTER — Ambulatory Visit: Payer: BC Managed Care – PPO | Admitting: Physician Assistant

## 2023-05-07 ENCOUNTER — Encounter: Payer: Self-pay | Admitting: Physician Assistant

## 2023-05-07 VITALS — BP 134/82 | HR 97 | Temp 98.1°F | Resp 20 | Wt 200.4 lb

## 2023-05-07 DIAGNOSIS — U071 COVID-19: Secondary | ICD-10-CM | POA: Diagnosis not present

## 2023-05-07 DIAGNOSIS — Z20822 Contact with and (suspected) exposure to covid-19: Secondary | ICD-10-CM | POA: Diagnosis not present

## 2023-05-07 DIAGNOSIS — B351 Tinea unguium: Secondary | ICD-10-CM | POA: Diagnosis not present

## 2023-05-07 LAB — POC COVID19 BINAXNOW: SARS Coronavirus 2 Ag: POSITIVE — AB

## 2023-05-07 MED ORDER — NIRMATRELVIR/RITONAVIR (PAXLOVID) TABLET (RENAL DOSING): 2.0000 | ORAL_TABLET | Freq: Two times a day (BID) | ORAL | 0 refills | Status: AC

## 2023-05-07 MED ORDER — NIRMATRELVIR/RITONAVIR (PAXLOVID) TABLET (RENAL DOSING): 2.0000 | ORAL_TABLET | Freq: Two times a day (BID) | ORAL | 0 refills | Status: DC

## 2023-05-07 MED ORDER — TERBINAFINE HCL 250 MG PO TABS: 250.0000 mg | ORAL_TABLET | Freq: Every day | ORAL | 0 refills | Status: DC

## 2023-05-07 NOTE — Telephone Encounter (Signed)
Patient advised I spoke with Thayer Ohm at pharmacy and he states he did not receive rx but epic show receipt was confirmed at pharmacy at 3:01 pm and Lillia Abed will resend rx.

## 2023-05-07 NOTE — Progress Notes (Signed)
Established patient visit   Patient: [name]   DOB: 11/03/1960   62 y.o. Female  MRN: [id number] Visit Date: 05/07/2023  Today's healthcare provider: Alfredia Ferguson, PA-C   Cc. Covid exposure, covid symptoms  Subjective    HPI  Discussed the use of AI scribe software for clinical note transcription with the patient, who gave verbal consent to proceed.  History of Present Illness   Pt reports COVID exposure last weekend. She started experiencing symptoms such as body aches, headache, and a sore throat on Monday. The patient also reports feeling extremely tired and having a "horrific" sore throat. She has been managing these symptoms with over-the-counter medications including Mucinex, cold and flu medication, and medication for the headache. Denies fever, SOB, wheezing.   The patient also mentions a nail fungus that has been present for a couple of years, which has recently started to break off. She has previously been treated for this condition on her toes, but the fungus on her fingernails has persisted.       Medications: Outpatient Medications Prior to Visit  Medication Sig   aspirin EC 81 MG tablet Take 1 tablet (81 mg total) by mouth daily.   baclofen (LIORESAL) 10 MG tablet Take 10 mg by mouth 2 (two) times daily. As needed "one at night"   fexofenadine (ALLEGRA) 180 MG tablet Take 180 mg by mouth daily.   ipratropium (ATROVENT) 0.03 % nasal spray Place 2 sprays into both nostrils 3 (three) times daily. Use as needed for nasal drainage and cough   lisinopril (ZESTRIL) 10 MG tablet TAKE 1 TABLET BY MOUTH EVERY DAY   Nutritional Supplements (JUICE PLUS FIBRE PO) Take 4 capsules by mouth daily. 2 capsule of fruit and 2 capsules of veggies   omeprazole (PRILOSEC OTC) 20 MG tablet Take 20 mg by mouth daily.   ondansetron (ZOFRAN) 4 MG tablet Take 1 tablet (4 mg total) by mouth every 8 (eight) hours as needed for nausea or vomiting.   oxybutynin (DITROPAN-XL) 10 MG 24  hr tablet TAKE 2 TABLETS EVERY EVENING BY MOUTH   rosuvastatin (CRESTOR) 10 MG tablet Take 1 tablet (10 mg total) by mouth daily.   topiramate (TOPAMAX) 50 MG tablet Take 50 mg by mouth daily in the afternoon.   No facility-administered medications prior to visit.    Review of Systems  Constitutional:  Positive for fatigue. Negative for fever.  HENT:  Positive for congestion and sore throat.   Respiratory:  Positive for cough. Negative for shortness of breath.   Cardiovascular:  Negative for chest pain and leg swelling.  Gastrointestinal:  Negative for abdominal pain.  Neurological:  Positive for headaches. Negative for dizziness.      Objective    BP 134/82 (BP Location: Left Arm, Patient Position: Sitting, Cuff Size: Normal)   Pulse 97   Temp 98.1 F (36.7 C) (Oral)   Resp 20   Wt 200 lb 6.4 oz (90.9 kg)   LMP 11/02/2011   SpO2 98%   BMI 32.35 kg/m Blood pressure 134/82, pulse 97, temperature 98.1 F (36.7 C), temperature source Oral, resp. rate 20, weight 200 lb 6.4 oz (90.9 kg), last menstrual period 11/02/2011, SpO2 98%.   Physical Exam Constitutional:      General: She is awake.     Appearance: She is well-developed.  HENT:     Head: Normocephalic.  Eyes:     Conjunctiva/sclera: Conjunctivae normal.  Cardiovascular:     Rate and  Rhythm: Normal rate and regular rhythm.     Heart sounds: Normal heart sounds.  Pulmonary:     Effort: Pulmonary effort is normal.     Breath sounds: Normal breath sounds.  Skin:    General: Skin is warm.  Neurological:     Mental Status: She is alert and oriented to person, place, and time.  Psychiatric:        Attention and Perception: Attention normal.        Mood and Affect: Mood normal.        Speech: Speech normal.        Behavior: Behavior is cooperative.      Results for orders placed or performed in visit on 05/07/23  POC COVID-19  Result Value Ref Range   SARS Coronavirus 2 Ag Positive (A) Negative    Assessment &  Plan     1. Close exposure to COVID-19 virus Coivd + - POC COVID-19  2. COVID-19 Assessment and Plan    COVID-19 -hydrate, rest, cont otc meds -Start Paxlovid, renal dosing for 5 days. -Stop rosuvastatin during Paxlovid treatment due to potential drug interaction.  - nirmatrelvir/ritonavir, renal dosing, (PAXLOVID) 10 x 150 MG & 10 x 100MG  TABS; Take 2 tablets by mouth 2 (two) times daily for 5 days. (Take nirmatrelvir 150 mg one tablet twice daily for 5 days and ritonavir 100 mg one tablet twice daily for 5 days) Patient GFR is 54  Dispense: 20 tablet; Refill: 0  3. Onychomycosis Chronic nail fungus affecting multiple fingernails, previously treated with oral antifungal medication with partial improvement. -Start Lamisil after completion of Paxlovid treatment. - terbinafine (LAMISIL) 250 MG tablet; Take 1 tablet (250 mg total) by mouth daily.  Dispense: 60 tablet; Refill: 0   Return if symptoms worsen or fail to improve.      I, Alfredia Ferguson, PA-C have reviewed all documentation for this visit. The documentation on  05/07/23   for the exam, diagnosis, procedures, and orders are all accurate and complete.    Alfredia Ferguson, PA-C  [facility] [phone] (phone) [phone] (fax)  [facility]

## 2023-05-07 NOTE — Addendum Note (Signed)
Addended byAlfredia Ferguson on: 05/07/2023 04:51 PM   Modules accepted: Orders

## 2023-05-09 ENCOUNTER — Encounter: Payer: Self-pay | Admitting: Family Medicine

## 2023-05-10 ENCOUNTER — Ambulatory Visit: Payer: BC Managed Care – PPO | Admitting: Family Medicine

## 2023-05-13 ENCOUNTER — Other Ambulatory Visit: Payer: Self-pay | Admitting: Physician Assistant

## 2023-05-13 ENCOUNTER — Encounter: Payer: Self-pay | Admitting: Physician Assistant

## 2023-05-13 DIAGNOSIS — R051 Acute cough: Secondary | ICD-10-CM

## 2023-05-13 MED ORDER — HYDROCOD POLI-CHLORPHE POLI ER 10-8 MG/5ML PO SUER: 5.0000 mL | Freq: Every evening | ORAL | 0 refills | Status: DC | PRN

## 2023-05-18 DIAGNOSIS — F4321 Adjustment disorder with depressed mood: Secondary | ICD-10-CM | POA: Diagnosis not present

## 2023-05-18 DIAGNOSIS — F411 Generalized anxiety disorder: Secondary | ICD-10-CM | POA: Diagnosis not present

## 2023-05-18 DIAGNOSIS — F900 Attention-deficit hyperactivity disorder, predominantly inattentive type: Secondary | ICD-10-CM | POA: Diagnosis not present

## 2023-06-14 ENCOUNTER — Encounter: Payer: Self-pay | Admitting: Family Medicine

## 2023-06-14 NOTE — Progress Notes (Unsigned)
No chief complaint on file.  History of Present Illness: 62 yo female with history of depression, GERD, hyperlipidemia, possible ulcerative colitis and prior TIA in 2019 who is here today for follow up. I saw her as a new consult in March 2024 for the evaluation of exercise intolerance, dizziness, diaphoresis and nausea. Echo in October 2019 with normal LV function and no significant valve disease. Cardiac monitor in 2019 with sinus. She described exertional dizziness, heart racing, nausea and fatigue. She was seen by Dr. Patsy Lager and chest CTA with no evidence of PE but there was coronary calcification noted. TSH normal. Coronary CTA April 2024 with minimal CAD, calcium score of 355. Cardiac monitor with sinus with short runs of SVT. Echo April 2024 with LVEF=55-60%. Mild LVH. Grade 1 diastolic dysfunction. No valve disease.   She is here today for follow up. The patient denies any chest pain, dyspnea, palpitations, lower extremity edema, orthopnea, PND, dizziness, near syncope or syncope.   Primary Care Physician: Pearline Cables, MD   Past Medical History:  Diagnosis Date   Arthritis    Right Shoulder, Feet, Fingers, Back, Neck   Bleeding nose    History of    Depression    Frequent urination    GERD (gastroesophageal reflux disease)    Gestational diabetes    Heart murmur    echo 10/20/2011   Hole in the ear drum, right    Hyperlipidemia    Minimal   Menopausal syndrome    Migraine without aura    Botox injection 10/21/2017   OA (osteoarthritis) of knee    Obese    Plantar warts    PMB (postmenopausal bleeding)    Pneumonia    history of    PONV (postoperative nausea and vomiting)    Postop Acute blood loss anemia 11/05/2011   TIA (transient ischemic attack) 06/05/2018   Ulcerative colitis (HCC)    Urgency of urination    Vertigo     Past Surgical History:  Procedure Laterality Date   BREAST BIOPSY Left    COLONOSCOPY     ear drum patched Right    KNEE  ARTHROSCOPY  2005   MOUTH SURGERY     gum surgery   skull fracture surgery     calcinatied bone from forehead   TONSILLECTOMY  1979   TOTAL KNEE ARTHROPLASTY  11/02/2011   Procedure: TOTAL KNEE ARTHROPLASTY;  Surgeon: Loanne Drilling, MD;  Location: [facility];  Service: Orthopedics;  Laterality: Left;   TOTAL KNEE ARTHROPLASTY Right 10/25/2017   Procedure: RIGHT TOTAL KNEE ARTHROPLASTY;  Surgeon: Ollen Gross, MD;  Location: [facility];  Service: Orthopedics;  Laterality: Right;    Current Outpatient Medications  Medication Sig Dispense Refill   aspirin EC 81 MG tablet Take 1 tablet (81 mg total) by mouth daily.     baclofen (LIORESAL) 10 MG tablet Take 10 mg by mouth 2 (two) times daily. As needed "one at night"     chlorpheniramine-HYDROcodone (TUSSIONEX) 10-8 MG/5ML Take 5 mLs by mouth at bedtime as needed for cough. 115 mL 0   fexofenadine (ALLEGRA) 180 MG tablet Take 180 mg by mouth daily.     ipratropium (ATROVENT) 0.03 % nasal spray Place 2 sprays into both nostrils 3 (three) times daily. Use as needed for nasal drainage and cough 30 mL 12   lisinopril (ZESTRIL) 10 MG tablet TAKE 1 TABLET BY MOUTH EVERY DAY 90 tablet 3   Nutritional Supplements (JUICE PLUS FIBRE PO)  Take 4 capsules by mouth daily. 2 capsule of fruit and 2 capsules of veggies     omeprazole (PRILOSEC OTC) 20 MG tablet Take 20 mg by mouth daily.     ondansetron (ZOFRAN) 4 MG tablet Take 1 tablet (4 mg total) by mouth every 8 (eight) hours as needed for nausea or vomiting. 20 tablet 0   oxybutynin (DITROPAN-XL) 10 MG 24 hr tablet TAKE 2 TABLETS EVERY EVENING BY MOUTH 180 tablet 3   rosuvastatin (CRESTOR) 10 MG tablet Take 1 tablet (10 mg total) by mouth daily. 90 tablet 3   terbinafine (LAMISIL) 250 MG tablet Take 1 tablet (250 mg total) by mouth daily. 60 tablet 0   topiramate (TOPAMAX) 50 MG tablet Take 50 mg by mouth daily in the afternoon.     No current facility-administered medications for this visit.    Allergies   Allergen Reactions   Codeine Nausea And Vomiting   Onion Other (See Comments)    Headache     Social History   Socioeconomic History   Marital status: Divorced    Spouse name: Not on file   Number of children: 1   Years of education: Not on file   Highest education level: Bachelor's degree (e.g., BA, AB, BS)  Occupational History    Comment: Chartered certified accountant   Occupation: She works for [employer] (she works from home)  Tobacco Use   Smoking status: Never   Smokeless tobacco: Never  Vaping Use   Vaping status: Never Used  Substance and Sexual Activity   Alcohol use: Yes    Comment: occassionally   Drug use: No   Sexual activity: Not Currently    Birth control/protection: Post-menopausal  Other Topics Concern   Not on file  Social History Narrative   Lives alone   Caffeine- coffee, 2-3 cups daily   Social Determinants of Health   Financial Resource Strain: Low Risk  (01/10/2023)   Received from [facility], [facility]   Overall Financial Resource Strain (CARDIA)    Difficulty of Paying Living Expenses: Not hard at all  Food Insecurity: No Food Insecurity (01/10/2023)   Received from [facility], [facility]   Hunger Vital Sign    Worried About Running Out of Food in the Last Year: Never true    Ran Out of Food in the Last Year: Never true  Transportation Needs: No Transportation Needs (01/10/2023)   Received from [facility], [facility]   PRAPARE - Transportation    Lack of Transportation (Medical): No    Lack of Transportation (Non-Medical): No  Physical Activity: Insufficiently Active (01/10/2023)   Received from [facility], [facility]   Exercise Vital Sign    Days of Exercise per Week: 1 day    Minutes of Exercise per Session: 20 min  Stress: No Stress Concern Present (01/10/2023)   Received from [facility], [facility] of Occupational Health - Occupational Stress Questionnaire    Feeling of Stress  : Only a little  Social Connections: Socially Integrated (01/10/2023)   Received from [facility], [facility]   Social Network    How would you rate your social network (family, work, friends)?: Good participation with social networks  Intimate Partner Violence: Not At Risk (01/10/2023)   Received from [facility], [facility]   HITS    Over the last 12 months how often did your partner physically hurt you?: 1    Over the last 12  months how often did your partner insult you or talk down to you?: 1    Over the last 12 months how often did your partner threaten you with physical harm?: 1    Over the last 12 months how often did your partner scream or curse at you?: 1    Family History  Problem Relation Age of Onset   Heart disease Mother        mitral valve prolapse   Lung cancer Father    Thyroid disease Father    Stroke Father    Thyroid disease Sister    Heart disease Brother    Heart disease Maternal Grandfather    Heart disease Paternal Grandfather    Heart disease Maternal Aunt    Diabetes Maternal Aunt    Heart disease Maternal Uncle    Heart disease Maternal Uncle    Heart disease Maternal Uncle    Heart disease Maternal Uncle    Thyroid disease Paternal Aunt    Colon cancer Neg Hx     Review of Systems:  As stated in the HPI and otherwise negative.   LMP 11/02/2011   Physical Examination: General: Well developed, well nourished, NAD  HEENT: OP clear, mucus membranes moist  SKIN: warm, dry. No rashes. Neuro: No focal deficits  Musculoskeletal: Muscle strength 5/5 all ext  Psychiatric: Mood and affect normal  Neck: No JVD, no carotid bruits, no thyromegaly, no lymphadenopathy.  Lungs:Clear bilaterally, no wheezes, rhonci, crackles Cardiovascular: Regular rate and rhythm. No murmurs, gallops or rubs. Abdomen:Soft. Bowel sounds present. Non-tender.  Extremities: No lower extremity edema. Pulses are 2 + in the bilateral DP/PT.  EKG:  EKG is not ordered  today. The ekg from 11/11/22 shows sinus. I personally reviewed this EKG  Echo October 2019: Left ventricle: The cavity size was normal. Systolic function was    normal. The estimated ejection fraction was in the range of 60%    to 65%. Wall motion was normal; there were no regional wall    motion abnormalities. There was an increased relative    contribution of atrial contraction to ventricular filling.    Doppler parameters are consistent with abnormal left ventricular    relaxation (grade 1 diastolic dysfunction).  - Aortic valve: Trileaflet; moderately thickened, moderately    calcified leaflets.  - Mitral valve: There was trivial regurgitation. Peak E/A ratio:    0.8.  - Pulmonary arteries: Systolic pressure could not be accurately    estimated.   Recent Labs: 03/18/2023: BUN 15; Creatinine, Ser 1.08; Hemoglobin 12.3; Platelets 313.0; Potassium 4.7; Sodium 136; TSH 0.87 04/06/2023: ALT 14   Lipid Panel    Component Value Date/Time   CHOL 155 04/06/2023 0902   TRIG 53 04/06/2023 0902   HDL 78 04/06/2023 0902   CHOLHDL 2.0 04/06/2023 0902   CHOLHDL 3 11/11/2022 1457   VLDL 19.6 11/11/2022 1457   LDLCALC 66 04/06/2023 0902     Wt Readings from Last 3 Encounters:  05/07/23 90.9 kg  03/18/23 90 kg  11/13/22 89.7 kg    Assessment and Plan:   1. SVT: ***  Labs/ tests ordered today include:  No orders of the defined types were placed in this encounter.  Disposition:   F/U with me in one year   Signed, Verne Carrow, MD, [facility] 06/14/2023 3:26 PM    [facility] [address] [place], [place], [place]  [address] Phone: [phone]; Fax: [phone]

## 2023-06-15 ENCOUNTER — Ambulatory Visit: Payer: BC Managed Care – PPO | Attending: Cardiovascular Disease | Admitting: Cardiovascular Disease

## 2023-06-15 ENCOUNTER — Encounter: Payer: Self-pay | Admitting: Cardiovascular Disease

## 2023-06-15 VITALS — BP 122/80 | HR 60 | Ht 66.0 in | Wt 196.2 lb

## 2023-06-15 DIAGNOSIS — I471 Supraventricular tachycardia, unspecified: Secondary | ICD-10-CM | POA: Diagnosis not present

## 2023-06-15 DIAGNOSIS — I2510 Atherosclerotic heart disease of native coronary artery without angina pectoris: Secondary | ICD-10-CM

## 2023-06-15 DIAGNOSIS — R42 Dizziness and giddiness: Secondary | ICD-10-CM | POA: Diagnosis not present

## 2023-06-16 DIAGNOSIS — M180 Bilateral primary osteoarthritis of first carpometacarpal joints: Secondary | ICD-10-CM | POA: Diagnosis not present

## 2023-07-01 ENCOUNTER — Encounter: Payer: Self-pay | Admitting: Gastroenterology

## 2023-07-07 ENCOUNTER — Ambulatory Visit: Payer: BC Managed Care – PPO | Admitting: Cardiology

## 2023-07-19 ENCOUNTER — Encounter: Payer: Self-pay | Admitting: Family Medicine

## 2023-07-19 ENCOUNTER — Telehealth: Payer: Self-pay | Admitting: Gastroenterology

## 2023-07-19 DIAGNOSIS — R0989 Other specified symptoms and signs involving the circulatory and respiratory systems: Secondary | ICD-10-CM

## 2023-07-19 NOTE — Telephone Encounter (Signed)
PT has been scheduled for a PV tomorrow 8am

## 2023-07-20 ENCOUNTER — Ambulatory Visit (AMBULATORY_SURGERY_CENTER): Payer: BC Managed Care – PPO | Admitting: *Deleted

## 2023-07-20 ENCOUNTER — Telehealth: Payer: Self-pay | Admitting: *Deleted

## 2023-07-20 ENCOUNTER — Encounter: Payer: Self-pay | Admitting: Gastroenterology

## 2023-07-20 VITALS — Ht 60.0 in | Wt 190.0 lb

## 2023-07-20 DIAGNOSIS — Z860100 Personal history of colon polyps, unspecified: Secondary | ICD-10-CM

## 2023-07-20 MED ORDER — NA SULFATE-K SULFATE-MG SULF 17.5-3.13-1.6 GM/177ML PO SOLN: 1.0000 | Freq: Once | ORAL | 0 refills | Status: AC

## 2023-07-20 NOTE — Telephone Encounter (Signed)
Pt called back.

## 2023-07-20 NOTE — Progress Notes (Signed)
Pt's name and DOB verified at the beginning of the pre-visit wit 2 identifiers  Pt denies any difficulty with ambulating,sitting, laying down or rolling side to side  Pt has issues with ambulation   Pt has no issues moving head neck or swallowing  No egg or soy allergy known to patient   Pt has hx of PONV  Pt denies having issues being intubated  No FH of Malignant Hyperthermia  Pt is not on diet pills or shots  Pt is not on home 02   Pt is not on blood thinners   Pt has frequent issues with constipation RN instructed pt to use Miralax per bottles instructions a week before prep days. Pt states they will  Pt is not on dialysis  Pt denise any abnormal heart rhythms   Pt denies any upcoming cardiac testing  Pt encouraged to use to use Singlecare or Goodrx to reduce cost   Patient's chart reviewed by Cathlyn Parsons CNRA prior to pre-visit and patient appropriate for the [facility].  Pre-visit completed and red dot placed by patient's name on their procedure day (on provider's schedule).  .  Visit by phone  Pt states weight is 190 lb  Instructed pt why it is important to and  to call if they have any changes in health or new medications. Directed them to the # given and on instructions.     Instructions reviewed. Pt given both [facility] main # and MD on call # prior to instructions.  Pt states understanding. Instructed to review again prior to procedure. Pt states they will.   Instructions sent by mail with coupon and by My Chart   Coupon sent via text to mobile phone and pt verified they received it

## 2023-07-20 NOTE — Telephone Encounter (Signed)
Calling pt back to review education for procedure. LM will try again in 5 minutes

## 2023-07-22 NOTE — Addendum Note (Signed)
Addended by: Abbe Amsterdam C on: 07/22/2023 07:19 AM   Modules accepted: Orders

## 2023-07-28 ENCOUNTER — Other Ambulatory Visit: Payer: BC Managed Care – PPO

## 2023-07-28 DIAGNOSIS — R0989 Other specified symptoms and signs involving the circulatory and respiratory systems: Secondary | ICD-10-CM | POA: Diagnosis not present

## 2023-07-28 NOTE — Progress Notes (Signed)
Pt provided urine for random urine testing and picked up 24 hour urine kit for remaining tests.

## 2023-07-29 LAB — CREATININE, URINE, RANDOM: Creatinine, Urine: 85 mg/dL (ref 20–275)

## 2023-07-30 ENCOUNTER — Other Ambulatory Visit: Payer: BC Managed Care – PPO

## 2023-07-30 DIAGNOSIS — R0989 Other specified symptoms and signs involving the circulatory and respiratory systems: Secondary | ICD-10-CM

## 2023-07-30 NOTE — Progress Notes (Signed)
Pt dropped off 24 hour urine.  Start: 07/29/23 @ 7am End: 07/30/23 @ 7am Total Volume: 2250 mL

## 2023-08-04 LAB — CATECHOLAMINES, FRACTIONATED, URINE, 24 HOUR
Calc Total (E+NE): 96 ug/(24.h) (ref 26–121)
Creatinine, Urine mg/day-CATEUR: 0.99 g/(24.h) (ref 0.50–2.15)
Dopamine 24 Hr Urine: 234 ug/(24.h) (ref 52–480)
Epinephrine, 24H, Ur: 8 ug/(24.h) (ref 2–24)
Norepinephrine, 24H, Ur: 88 ug/(24.h) (ref 15–100)
Total Volume: 2250 mL

## 2023-08-05 LAB — METANEPHRINES, URINE, 24 HOUR
METANEPHRINE: 150 ug/(24.h) (ref 90–315)
METANEPHRINES, TOTAL: 817 ug/(24.h) (ref 224–832)
NORMETANEPHRINE: 667 ug/(24.h) (ref 122–676)
Total Volume: 2250 mL

## 2023-08-08 ENCOUNTER — Encounter: Payer: Self-pay | Admitting: Family Medicine

## 2023-08-09 ENCOUNTER — Ambulatory Visit: Payer: BC Managed Care – PPO | Admitting: Gastroenterology

## 2023-08-09 ENCOUNTER — Encounter: Payer: Self-pay | Admitting: Gastroenterology

## 2023-08-09 VITALS — BP 134/74 | HR 80 | Temp 97.8°F | Resp 12 | Ht 66.0 in | Wt 190.0 lb

## 2023-08-09 DIAGNOSIS — Z860100 Personal history of colon polyps, unspecified: Secondary | ICD-10-CM | POA: Diagnosis not present

## 2023-08-09 DIAGNOSIS — Z1211 Encounter for screening for malignant neoplasm of colon: Secondary | ICD-10-CM

## 2023-08-09 DIAGNOSIS — K640 First degree hemorrhoids: Secondary | ICD-10-CM | POA: Diagnosis not present

## 2023-08-09 MED ORDER — SODIUM CHLORIDE 0.9 % IV SOLN: 500.0000 mL | Freq: Once | INTRAVENOUS | Status: DC

## 2023-08-09 NOTE — Progress Notes (Signed)
To pacu, VSS. Report to RN.tb 

## 2023-08-09 NOTE — Progress Notes (Signed)
Called to room to assist during endoscopic procedure.  Patient ID and intended procedure confirmed with present staff. Received instructions for my participation in the procedure from the performing physician.  

## 2023-08-09 NOTE — Progress Notes (Signed)
[facility] History and Physical   Primary Care Physician:  Pearline Cables, MD   Reason for Procedure:   Colon cancer screening  Plan:    Screening colonoscopy     HPI: [name] is a 62 y.o. female undergoing average risk screening colonoscopy.  She has no family history of colon cancer and no chronic GI symptoms other than occasional hematochezia.  She had a colonoscopy last May to evaluate hematochezia which was limited by inadequate bowel prep.  She had a rectal hyperplastic polyp removed at that time.   Past Medical History:  Diagnosis Date   Arthritis    Right Shoulder, Feet, Fingers, Back, Neck   Bleeding nose    History of    Frequent urination    GERD (gastroesophageal reflux disease)    Gestational diabetes    Heart murmur    echo 10/20/2011   Hole in the ear drum, right    Hyperlipidemia    Minimal   Hypertension    Menopausal syndrome    Migraine without aura    Botox injection 10/21/2017   OA (osteoarthritis) of knee    Obese    Plantar warts    PMB (postmenopausal bleeding)    Pneumonia    history of    PONV (postoperative nausea and vomiting)    Postop Acute blood loss anemia 11/05/2011   Preproliferative diabetic retinopathy (HCC)    TIA (transient ischemic attack) 06/05/2018   Ulcerative colitis (HCC)    Urgency of urination    Vertigo     Past Surgical History:  Procedure Laterality Date   BREAST BIOPSY Left    COLONOSCOPY     ear drum patched Right    KNEE ARTHROSCOPY  2005   MOUTH SURGERY     gum surgery   skull fracture surgery     calcinatied bone from forehead   TONSILLECTOMY  1979   TOTAL KNEE ARTHROPLASTY  11/02/2011   Procedure: TOTAL KNEE ARTHROPLASTY;  Surgeon: Loanne Drilling, MD;  Location: [facility];  Service: Orthopedics;  Laterality: Left;   TOTAL KNEE ARTHROPLASTY Right 10/25/2017   Procedure: RIGHT TOTAL KNEE ARTHROPLASTY;  Surgeon: Ollen Gross, MD;  Location: [facility];  Service: Orthopedics;   Laterality: Right;    Prior to Admission medications   Medication Sig Start Date End Date Taking? Authorizing Provider  aspirin EC 81 MG tablet Take 1 tablet (81 mg total) by mouth daily. 06/15/18  Yes Penumalli, Glenford Bayley, MD  fexofenadine (ALLEGRA) 180 MG tablet Take 180 mg by mouth daily.   Yes [provider]  Galcanezumab-gnlm (EMGALITY) 120 MG/ML SOAJ Inject 120 mg as directed every 30 (thirty) days.   Yes [provider]  ipratropium (ATROVENT) 0.03 % nasal spray Place 2 sprays into both nostrils 3 (three) times daily. Use as needed for nasal drainage and cough 11/11/22  Yes Copland, Gwenlyn Found, MD  lisinopril (ZESTRIL) 10 MG tablet TAKE 1 TABLET BY MOUTH EVERY DAY 09/07/22  Yes Copland, Gwenlyn Found, MD  Nutritional Supplements (JUICE PLUS FIBRE PO) Take 4 capsules by mouth daily. 2 capsule of fruit and 2 capsules of veggies   Yes [provider]  omeprazole (PRILOSEC OTC) 20 MG tablet Take 20 mg by mouth daily.   Yes [provider]  oxybutynin (DITROPAN-XL) 10 MG 24 hr tablet TAKE 2 TABLETS EVERY EVENING BY MOUTH 03/01/23  Yes Copland, Gwenlyn Found, MD  rosuvastatin (CRESTOR) 10 MG tablet Take 1 tablet (10 mg total) by mouth  daily. 12/14/22  Yes Kathleene Hazel, MD  Topiramate ER (TROKENDI XR) 50 MG CP24 Take 1 capsule by mouth daily. 08/06/23  Yes [provider]  TRINTELLIX 10 MG TABS tablet Take 10 mg by mouth daily. 05/21/23  Yes [provider]  venlafaxine (EFFEXOR) 25 MG tablet Take 25 mg by mouth 2 (two) times daily.   Yes [provider]  baclofen (LIORESAL) 10 MG tablet Take 10 mg by mouth 2 (two) times daily. As needed "one at night"    [provider]  meclizine (ANTIVERT) 50 MG tablet     [provider]    Current Outpatient Medications  Medication Sig Dispense Refill   aspirin EC 81 MG tablet Take 1 tablet (81 mg total) by mouth daily.     fexofenadine (ALLEGRA) 180 MG tablet Take 180 mg by  mouth daily.     Galcanezumab-gnlm (EMGALITY) 120 MG/ML SOAJ Inject 120 mg as directed every 30 (thirty) days.     ipratropium (ATROVENT) 0.03 % nasal spray Place 2 sprays into both nostrils 3 (three) times daily. Use as needed for nasal drainage and cough 30 mL 12   lisinopril (ZESTRIL) 10 MG tablet TAKE 1 TABLET BY MOUTH EVERY DAY 90 tablet 3   Nutritional Supplements (JUICE PLUS FIBRE PO) Take 4 capsules by mouth daily. 2 capsule of fruit and 2 capsules of veggies     omeprazole (PRILOSEC OTC) 20 MG tablet Take 20 mg by mouth daily.     oxybutynin (DITROPAN-XL) 10 MG 24 hr tablet TAKE 2 TABLETS EVERY EVENING BY MOUTH 180 tablet 3   rosuvastatin (CRESTOR) 10 MG tablet Take 1 tablet (10 mg total) by mouth daily. 90 tablet 3   Topiramate ER (TROKENDI XR) 50 MG CP24 Take 1 capsule by mouth daily.     TRINTELLIX 10 MG TABS tablet Take 10 mg by mouth daily.     venlafaxine (EFFEXOR) 25 MG tablet Take 25 mg by mouth 2 (two) times daily.     baclofen (LIORESAL) 10 MG tablet Take 10 mg by mouth 2 (two) times daily. As needed "one at night"     meclizine (ANTIVERT) 50 MG tablet      Current Facility-Administered Medications  Medication Dose Route Frequency Provider Last Rate Last Admin   0.9 %  sodium chloride infusion  500 mL Intravenous Once Jenel Lucks, MD        Allergies as of 08/09/2023 - Review Complete 08/09/2023  Allergen Reaction Noted   Codeine Nausea And Vomiting 08/10/2011   Onion Other (See Comments) 10/22/2011   Prednisone Nausea Only 10/22/2011    Family History  Problem Relation Age of Onset   Heart disease Mother        mitral valve prolapse   Lung cancer Father    Thyroid disease Father    Stroke Father    Thyroid disease Sister    Heart disease Brother    Heart disease Maternal Aunt    Diabetes Maternal Aunt    Heart disease Maternal Uncle    Heart disease Maternal Uncle    Heart disease Maternal Uncle    Heart disease Maternal Uncle    Thyroid disease  Paternal Aunt    Heart disease Maternal Grandfather    Heart disease Paternal Grandfather    Colon cancer Neg Hx    Colon polyps Neg Hx    Esophageal cancer Neg Hx    Stomach cancer Neg Hx    Rectal cancer Neg Hx  Social History   Socioeconomic History   Marital status: Divorced    Spouse name: Not on file   Number of children: 1   Years of education: Not on file   Highest education level: Bachelor's degree (e.g., BA, AB, BS)  Occupational History    Comment: Chartered certified accountant   Occupation: She works for [employer] (she works from home)  Tobacco Use   Smoking status: Never   Smokeless tobacco: Never  Vaping Use   Vaping status: Never Used  Substance and Sexual Activity   Alcohol use: Yes    Comment: occassionally   Drug use: No   Sexual activity: Not Currently    Birth control/protection: Post-menopausal  Other Topics Concern   Not on file  Social History Narrative   Lives alone   Caffeine- coffee, 2-3 cups daily   Social Drivers of Health   Financial Resource Strain: Low Risk  (01/10/2023)   Received from [facility], [facility]   Overall Financial Resource Strain (CARDIA)    Difficulty of Paying Living Expenses: Not hard at all  Food Insecurity: No Food Insecurity (01/10/2023)   Received from [facility], [facility]   Hunger Vital Sign    Worried About Running Out of Food in the Last Year: Never true    Ran Out of Food in the Last Year: Never true  Transportation Needs: No Transportation Needs (01/10/2023)   Received from [facility], [facility]   PRAPARE - Transportation    Lack of Transportation (Medical): No    Lack of Transportation (Non-Medical): No  Physical Activity: Insufficiently Active (01/10/2023)   Received from [facility], [facility]   Exercise Vital Sign    Days of Exercise per Week: 1 day    Minutes of Exercise per Session: 20 min  Stress: No Stress Concern Present (01/10/2023)   Received from [facility], [facility] of Occupational Health - Occupational Stress Questionnaire    Feeling of Stress : Only a little  Social Connections: Socially Integrated (01/10/2023)   Received from [facility], [facility]   Social Network    How would you rate your social network (family, work, friends)?: Good participation with social networks  Intimate Partner Violence: Not At Risk (01/10/2023)   Received from [facility], [facility]   HITS    Over the last 12 months how often did your partner physically hurt you?: Never    Over the last 12 months how often did your partner insult you or talk down to you?: Never    Over the last 12 months how often did your partner threaten you with physical harm?: Never    Over the last 12 months how often did your partner scream or curse at you?: Never    Review of Systems:  All other review of systems negative except as mentioned in the HPI.  Physical Exam: Vital signs BP 128/74   Pulse 81   Temp 97.8 F (36.6 C) (Temporal)   Ht 5\' 6"  (1.676 m)   Wt 190 lb (86.2 kg)   LMP 11/02/2011   SpO2 97%   BMI 30.67 kg/m   General:   Alert,  Well-developed, well-nourished, pleasant and cooperative in NAD Airway:  Mallampati 2 Lungs:  Clear throughout to auscultation.   Heart:  Regular rate and rhythm; no murmurs, clicks, rubs,  or gallops. Abdomen:  Soft, nontender and nondistended. Normal bowel sounds.   Neuro/Psych:  Normal mood and affect.  A and O x 3   Danielle Swanton E. Tomasa Rand, MD [facility]

## 2023-08-09 NOTE — Progress Notes (Signed)
Pt's states no medical or surgical changes since previsit or office visit. 

## 2023-08-09 NOTE — Patient Instructions (Signed)
Please read handouts provided. Continue present medications. Await pathology results. Repeat colonoscopy in 10 years for screening.   YOU HAD AN ENDOSCOPIC PROCEDURE TODAY AT THE [facility]:   Refer to the procedure report that was given to you for any specific questions about what was found during the examination.  If the procedure report does not answer your questions, please call your gastroenterologist to clarify.  If you requested that your care partner not be given the details of your procedure findings, then the procedure report has been included in a sealed envelope for you to review at your convenience later.  YOU SHOULD EXPECT: Some feelings of bloating in the abdomen. Passage of more gas than usual.  Walking can help get rid of the air that was put into your GI tract during the procedure and reduce the bloating. If you had a lower endoscopy (such as a colonoscopy or flexible sigmoidoscopy) you may notice spotting of blood in your stool or on the toilet paper. If you underwent a bowel prep for your procedure, you may not have a normal bowel movement for a few days.  Please Note:  You might notice some irritation and congestion in your nose or some drainage.  This is from the oxygen used during your procedure.  There is no need for concern and it should clear up in a day or so.  SYMPTOMS TO REPORT IMMEDIATELY:  Following lower endoscopy (colonoscopy or flexible sigmoidoscopy):  Excessive amounts of blood in the stool  Significant tenderness or worsening of abdominal pains  Swelling of the abdomen that is new, acute  Fever of 100F or higher.  For urgent or emergent issues, a gastroenterologist can be reached at any hour by calling [phone]. Do not use MyChart messaging for urgent concerns.    DIET:  We do recommend a small meal at first, but then you may proceed to your regular diet.  Drink plenty of fluids but you should avoid alcoholic beverages for 24  hours.  ACTIVITY:  You should plan to take it easy for the rest of today and you should NOT DRIVE or use heavy machinery until tomorrow (because of the sedation medicines used during the test).    FOLLOW UP: Our staff will call the number listed on your records the next business day following your procedure.  We will call around 7:15- 8:00 am to check on you and address any questions or concerns that you may have regarding the information given to you following your procedure. If we do not reach you, we will leave a message.     If any biopsies were taken you will be contacted by phone or by letter within the next 1-3 weeks.  Please call us at [phone] if you have not heard about the biopsies in 3 weeks.    SIGNATURES/CONFIDENTIALITY: You and/or your care partner have signed paperwork which will be entered into your electronic medical record.  These signatures attest to the fact that that the information above on your After Visit Summary has been reviewed and is understood.  Full responsibility of the confidentiality of this discharge information lies with you and/or your care-partner.  

## 2023-08-09 NOTE — Op Note (Signed)
[facility] Patient Name: [name] Procedure Date: 08/09/2023 9:05 AM MRN: [id number] Endoscopist: Lorin Picket E. Tomasa Rand , MD, [id number] Age: 62 Referring MD:  Date of Birth: 1961-01-17 Gender: Female Account #: [id number] Procedure:                Colonoscopy Indications:              Screening for colorectal malignant neoplasm,                            inadequate bowel prep on last colonoscopy (more                            recent than 10 years ago), Last colonoscopy: May                            2023 Medicines:                Monitored Anesthesia Care Procedure:                Pre-Anesthesia Assessment:                           - Prior to the procedure, a History and Physical                            was performed, and patient medications and                            allergies were reviewed. The patient's tolerance of                            previous anesthesia was also reviewed. The risks                            and benefits of the procedure and the sedation                            options and risks were discussed with the patient.                            All questions were answered, and informed consent                            was obtained. Prior Anticoagulants: The patient has                            taken no anticoagulant or antiplatelet agents                            except for aspirin. ASA Grade Assessment: II - A                            patient with mild systemic disease. After reviewing  the risks and benefits, the patient was deemed in                            satisfactory condition to undergo the procedure.                           After obtaining informed consent, the colonoscope                            was passed under direct vision. Throughout the                            procedure, the patient's blood pressure, pulse, and                            oxygen saturations were monitored  continuously. The                            Olympus Scope SN: [id number] was introduced through                            the anus and advanced to the the cecum, identified                            by appendiceal orifice and ileocecal valve. The                            colonoscopy was performed without difficulty. The                            patient tolerated the procedure well. The quality                            of the bowel preparation was excellent. The                            ileocecal valve, appendiceal orifice, and rectum                            were photographed. The bowel preparation used was                            SUPREP via extended prep with split dose                            instruction. Scope In: 9:16:05 AM Scope Out: 9:31:45 AM Scope Withdrawal Time: 0 hours 8 minutes 30 seconds  Total Procedure Duration: 0 hours 15 minutes 40 seconds  Findings:                 The perianal and digital rectal examinations were                            normal. Pertinent negatives include normal  sphincter tone and no palpable rectal lesions.                           A large scar was found in the distal transverse                            colon. The scar was unremarkable in appearance.                            Biopsies were taken with a cold forceps for                            histology. Estimated blood loss was minimal.                           The exam was otherwise normal throughout the                            examined colon.                           Non-bleeding internal hemorrhoids were found during                            retroflexion. The hemorrhoids were Grade I                            (internal hemorrhoids that do not prolapse).                           No additional abnormalities were found on                            retroflexion. Complications:            No immediate complications. Estimated Blood Loss:      Estimated blood loss was minimal. Impression:               - Scar in the distal transverse colon. Biopsied.                            Query history of colitis, less likely large polyp.                           - Non-bleeding internal hemorrhoids. Recommendation:           - Patient has a contact number available for                            emergencies. The signs and symptoms of potential                            delayed complications were discussed with the                            patient. Return to normal activities tomorrow.  Written discharge instructions were provided to the                            patient.                           - Resume previous diet.                           - Continue present medications.                           - Await pathology results.                           - Repeat colonoscopy in 10 years for screening                            purposes. Earlin Sweeden E. Tomasa Rand, MD 08/09/2023 9:36:59 AM This report has been signed electronically.

## 2023-08-10 ENCOUNTER — Telehealth: Payer: Self-pay

## 2023-08-10 NOTE — Telephone Encounter (Signed)
  Follow up Call-     08/09/2023    8:13 AM 01/07/2022    9:01 AM  Call back number  Post procedure Call Back phone  # [phone]  Permission to leave phone message Yes Yes     Patient questions:  Do you have a fever, pain , or abdominal swelling? No. Pain Score  0 *  Have you tolerated food without any problems? Yes.    Have you been able to return to your normal activities? Yes.    Do you have any questions about your discharge instructions: Diet   No. Medications  No. Follow up visit  No.  Do you have questions or concerns about your Care? No.  Actions: * If pain score is 4 or above: No action needed, pain <4.

## 2023-08-11 LAB — SURGICAL PATHOLOGY

## 2023-08-12 NOTE — Progress Notes (Signed)
[name], The biopsies of your colon were completely normal.  No precancerous changes or other findings were found.  As discussed, I suspect the scarred appearance may be related to previous infection. Please repeat a colonoscopy in 10 years for routine colon cancer screening.

## 2023-08-25 ENCOUNTER — Encounter (INDEPENDENT_AMBULATORY_CARE_PROVIDER_SITE_OTHER): Payer: BC Managed Care – PPO | Admitting: Family Medicine

## 2023-08-25 DIAGNOSIS — R350 Frequency of micturition: Secondary | ICD-10-CM

## 2023-08-26 ENCOUNTER — Other Ambulatory Visit (INDEPENDENT_AMBULATORY_CARE_PROVIDER_SITE_OTHER): Payer: BC Managed Care – PPO

## 2023-08-26 DIAGNOSIS — R350 Frequency of micturition: Secondary | ICD-10-CM

## 2023-08-26 LAB — POCT URINALYSIS DIP (MANUAL ENTRY)
Bilirubin, UA: NEGATIVE
Blood, UA: NEGATIVE
Glucose, UA: NEGATIVE mg/dL
Ketones, POC UA: NEGATIVE mg/dL
Nitrite, UA: NEGATIVE
Protein Ur, POC: 30 mg/dL — AB
Spec Grav, UA: 1.025 (ref 1.010–1.025)
Urobilinogen, UA: 0.2 U/dL
pH, UA: 5 (ref 5.0–8.0)

## 2023-08-26 MED ORDER — CEPHALEXIN 500 MG PO CAPS: 500.0000 mg | ORAL_CAPSULE | Freq: Two times a day (BID) | ORAL | 0 refills | Status: DC

## 2023-08-26 NOTE — Addendum Note (Signed)
 Addended by: Abbe Amsterdam C on: 08/26/2023 05:25 PM   Modules accepted: Orders

## 2023-08-26 NOTE — Telephone Encounter (Signed)

## 2023-08-26 NOTE — Progress Notes (Signed)
 Urine sample provided.

## 2023-08-29 LAB — URINE CULTURE
MICRO NUMBER:: 15909963
SPECIMEN QUALITY:: ADEQUATE

## 2023-09-05 ENCOUNTER — Other Ambulatory Visit: Payer: Self-pay | Admitting: Physician Assistant

## 2023-09-05 DIAGNOSIS — B351 Tinea unguium: Secondary | ICD-10-CM

## 2023-09-06 ENCOUNTER — Telehealth: Payer: BC Managed Care – PPO | Admitting: Family Medicine

## 2023-09-06 ENCOUNTER — Encounter (INDEPENDENT_AMBULATORY_CARE_PROVIDER_SITE_OTHER): Payer: BC Managed Care – PPO | Admitting: Physician Assistant

## 2023-09-06 DIAGNOSIS — B351 Tinea unguium: Secondary | ICD-10-CM | POA: Diagnosis not present

## 2023-09-06 DIAGNOSIS — J069 Acute upper respiratory infection, unspecified: Secondary | ICD-10-CM | POA: Diagnosis not present

## 2023-09-06 MED ORDER — AZELASTINE HCL 0.1 % NA SOLN: 2.0000 | Freq: Two times a day (BID) | NASAL | 0 refills | Status: AC

## 2023-09-06 MED ORDER — TERBINAFINE HCL 250 MG PO TABS: 250.0000 mg | ORAL_TABLET | Freq: Every day | ORAL | 0 refills | Status: DC

## 2023-09-06 NOTE — Telephone Encounter (Signed)

## 2023-09-06 NOTE — Progress Notes (Signed)

## 2023-10-20 ENCOUNTER — Encounter: Payer: Self-pay | Admitting: Family Medicine

## 2023-10-27 DIAGNOSIS — F411 Generalized anxiety disorder: Secondary | ICD-10-CM | POA: Diagnosis not present

## 2023-10-27 DIAGNOSIS — F900 Attention-deficit hyperactivity disorder, predominantly inattentive type: Secondary | ICD-10-CM | POA: Diagnosis not present

## 2023-10-27 DIAGNOSIS — F3341 Major depressive disorder, recurrent, in partial remission: Secondary | ICD-10-CM | POA: Diagnosis not present

## 2023-11-22 ENCOUNTER — Other Ambulatory Visit: Payer: Self-pay | Admitting: Family Medicine

## 2023-11-22 DIAGNOSIS — I10 Essential (primary) hypertension: Secondary | ICD-10-CM

## 2023-12-02 ENCOUNTER — Other Ambulatory Visit: Payer: Self-pay | Admitting: Family Medicine

## 2023-12-02 DIAGNOSIS — I10 Essential (primary) hypertension: Secondary | ICD-10-CM

## 2023-12-07 ENCOUNTER — Other Ambulatory Visit: Payer: Self-pay

## 2023-12-07 MED ORDER — ROSUVASTATIN CALCIUM 10 MG PO TABS: 10.0000 mg | ORAL_TABLET | Freq: Every day | ORAL | 1 refills | Status: DC

## 2023-12-27 DIAGNOSIS — F3342 Major depressive disorder, recurrent, in full remission: Secondary | ICD-10-CM | POA: Diagnosis not present

## 2024-01-26 ENCOUNTER — Encounter: Payer: Self-pay | Admitting: Family Medicine

## 2024-01-26 ENCOUNTER — Other Ambulatory Visit: Payer: Self-pay | Admitting: Family Medicine

## 2024-01-26 DIAGNOSIS — B351 Tinea unguium: Secondary | ICD-10-CM

## 2024-02-29 ENCOUNTER — Encounter: Payer: Self-pay | Admitting: Family Medicine

## 2024-02-29 DIAGNOSIS — B351 Tinea unguium: Secondary | ICD-10-CM

## 2024-02-29 MED ORDER — TERBINAFINE HCL 250 MG PO TABS
250.0000 mg | ORAL_TABLET | Freq: Every day | ORAL | 0 refills | Status: AC
Start: 2024-02-29 — End: ?

## 2024-03-09 ENCOUNTER — Encounter: Payer: Self-pay | Admitting: Family Medicine

## 2024-03-09 DIAGNOSIS — I10 Essential (primary) hypertension: Secondary | ICD-10-CM

## 2024-03-09 MED ORDER — LISINOPRIL 10 MG PO TABS: 10.0000 mg | ORAL_TABLET | Freq: Every day | ORAL | 0 refills | Status: DC

## 2024-03-11 NOTE — Patient Instructions (Incomplete)
 It was good to see you today, I will be in touch with your labs

## 2024-03-11 NOTE — Progress Notes (Unsigned)
 [facility] [address], [address] [place], [place] [address] [id number] [phone]  Date:  03/15/2024   Name:  [name]   DOB:  05/03/1961   MRN:  [id number]  PCP:  Danielle Harlene BROCKS, MD    Chief Complaint: No chief complaint on file.   History of Present Illness:  [name] is a 63 y.o. very pleasant female patient who presents with the following:  Patient seen today for physical exam.  Most recent visit with myself was about 1 year ago At that time she was dealing with spells where she will feel very hot and weak  She saw cardiology and neurology about these issues Otherwise, she has history of CAD, SVT, TIA 2019, prediabetes, ulcerative colitis, GERD, osteoarthritis  She saw her gastroenterologist, Dr. Stacia for a colonoscopy in December  She was seen by cardiology, Dr. Verlin in October: 1. SVT: No palpitations.  2. CAD without angina: She has very mild coronary plaque by coronary CTA in April 2024. Continue ASA and statin.  3. Dizziness: I do not think her symptoms are cardiac related. We talked about the possibility of hormonal issues. She went through menopause years ago. Possible vasovagal related or POTS. I have asked her to push her po intake of water  daily and push salt intake.   Most recent mammogram on chart is from 2019 at which point follow-up was recommended-I do not believe she has followed up on her mammogram.  I did order a follow-up at her last physical in 2023 Pap 2023  Recommend pneumonia vaccination She has completed Shingrix  Patient Active Problem List   Diagnosis Date Noted   Prediabetes 03/19/2023   OA (osteoarthritis) of knee 10/25/2017   Ulcerative colitis (HCC) 05/13/2014   Post-menopausal bleeding  evalutated  Dr. Dannielle 03/13/2014   Blood in stool 03/13/2014   Fibroadenoma of left breast 04/10/2013   ANA positive 03/20/2013   Elevated transaminase measurement 03/15/2013    Vaginal spotting 12/08/2012   Abnormal Pap smear 10/13/2011   Cardiac murmur 10/13/2011   GERD (gastroesophageal reflux disease) 10/13/2011   Urticaria 10/13/2011   Arthritis    Migraine    Depression    Menopausal syndrome     Past Medical History:  Diagnosis Date   Arthritis    Right Shoulder, Feet, Fingers, Back, Neck   Bleeding nose    History of    Frequent urination    GERD (gastroesophageal reflux disease)    Gestational diabetes    Heart murmur    echo 10/20/2011   Hole in the ear drum, right    Hyperlipidemia    Minimal   Hypertension    Menopausal syndrome    Migraine without aura    Botox injection 10/21/2017   OA (osteoarthritis) of knee    Obese    Plantar warts    PMB (postmenopausal bleeding)    Pneumonia    history of    PONV (postoperative nausea and vomiting)    Postop Acute blood loss anemia 11/05/2011   Preproliferative diabetic retinopathy (HCC)    TIA (transient ischemic attack) 06/05/2018   Ulcerative colitis (HCC)    Urgency of urination    Vertigo     Past Surgical History:  Procedure Laterality Date   BREAST BIOPSY Left    COLONOSCOPY     ear drum patched Right    KNEE ARTHROSCOPY  2005   MOUTH SURGERY  gum surgery   skull fracture surgery     calcinatied bone from forehead   TONSILLECTOMY  1979   TOTAL KNEE ARTHROPLASTY  11/02/2011   Procedure: TOTAL KNEE ARTHROPLASTY;  Surgeon: Dempsey LULLA Moan, MD;  Location: [facility];  Service: Orthopedics;  Laterality: Left;   TOTAL KNEE ARTHROPLASTY Right 10/25/2017   Procedure: RIGHT TOTAL KNEE ARTHROPLASTY;  Surgeon: Moan Dempsey, MD;  Location: [facility];  Service: Orthopedics;  Laterality: Right;    Social History   Tobacco Use   Smoking status: Never   Smokeless tobacco: Never  Vaping Use   Vaping status: Never Used  Substance Use Topics   Alcohol use: Yes    Comment: occassionally   Drug use: No    Family History  Problem Relation Age of Onset   Heart disease Mother         mitral valve prolapse   Lung cancer Father    Thyroid  disease Father    Stroke Father    Thyroid  disease Sister    Heart disease Brother    Heart disease Maternal Aunt    Diabetes Maternal Aunt    Heart disease Maternal Uncle    Heart disease Maternal Uncle    Heart disease Maternal Uncle    Heart disease Maternal Uncle    Thyroid  disease Paternal Aunt    Heart disease Maternal Grandfather    Heart disease Paternal Grandfather    Colon cancer Neg Hx    Colon polyps Neg Hx    Esophageal cancer Neg Hx    Stomach cancer Neg Hx    Rectal cancer Neg Hx     Allergies  Allergen Reactions   Codeine Nausea And Vomiting   Onion Other (See Comments)    Headache    Prednisone  Nausea Only    Medication list has been reviewed and updated.  Current Outpatient Medications on File Prior to Visit  Medication Sig Dispense Refill   aspirin  EC 81 MG tablet Take 1 tablet (81 mg total) by mouth daily.     azelastine  (ASTELIN ) 0.1 % nasal spray Place 2 sprays into both nostrils 2 (two) times daily. Use in each nostril as directed 30 mL 0   baclofen (LIORESAL) 10 MG tablet Take 10 mg by mouth 2 (two) times daily. As needed one at night     cephALEXin  (KEFLEX ) 500 MG capsule Take 1 capsule (500 mg total) by mouth 2 (two) times daily. 14 capsule 0   fexofenadine (ALLEGRA) 180 MG tablet Take 180 mg by mouth daily.     Galcanezumab-gnlm (EMGALITY) 120 MG/ML SOAJ Inject 120 mg as directed every 30 (thirty) days.     ipratropium (ATROVENT ) 0.03 % nasal spray Place 2 sprays into both nostrils 3 (three) times daily. Use as needed for nasal drainage and cough 30 mL 12   lisinopril  (ZESTRIL ) 10 MG tablet Take 1 tablet (10 mg total) by mouth daily. Appt for refills 30 tablet 0   meclizine  (ANTIVERT ) 50 MG tablet      Nutritional Supplements (JUICE PLUS FIBRE PO) Take 4 capsules by mouth daily. 2 capsule of fruit and 2 capsules of veggies     omeprazole  (PRILOSEC  OTC) 20 MG tablet Take 20 mg by mouth  daily.     oxybutynin  (DITROPAN -XL) 10 MG 24 hr tablet TAKE 2 TABLETS EVERY EVENING BY MOUTH 180 tablet 3   rosuvastatin  (CRESTOR ) 10 MG tablet Take 1 tablet (10 mg total) by mouth daily. 90 tablet 1   terbinafine  (LAMISIL ) 250  MG tablet Take 1 tablet (250 mg total) by mouth daily. 60 tablet 0   Topiramate  ER (TROKENDI  XR) 50 MG CP24 Take 1 capsule by mouth daily.     TRINTELLIX 10 MG TABS tablet Take 10 mg by mouth daily.     venlafaxine  (EFFEXOR ) 25 MG tablet Take 25 mg by mouth 2 (two) times daily.     No current facility-administered medications on file prior to visit.    Review of Systems:  As per HPI- otherwise negative.   Physical Examination: There were no vitals filed for this visit. There were no vitals filed for this visit. There is no height or weight on file to calculate BMI. Ideal Body Weight:    GEN: no acute distress. HEENT: Atraumatic, Normocephalic.  Ears and Nose: No external deformity. CV: RRR, No M/G/R. No JVD. No thrill. No extra heart sounds. PULM: CTA B, no wheezes, crackles, rhonchi. No retractions. No resp. distress. No accessory muscle use. ABD: S, NT, ND, +BS. No rebound. No HSM. EXTR: No c/c/e PSYCH: Normally interactive. Conversant.    Assessment and Plan: *** Physical exam today.  Encouraged healthy diet and exercise routine Will plan further follow- up pending labs.  Signed Harlene Schroeder, MD

## 2024-03-12 ENCOUNTER — Other Ambulatory Visit: Payer: Self-pay | Admitting: Family Medicine

## 2024-03-12 ENCOUNTER — Other Ambulatory Visit: Payer: Self-pay | Admitting: Cardiovascular Disease

## 2024-03-12 DIAGNOSIS — I10 Essential (primary) hypertension: Secondary | ICD-10-CM

## 2024-03-15 ENCOUNTER — Encounter: Payer: Self-pay | Admitting: Family Medicine

## 2024-03-15 ENCOUNTER — Ambulatory Visit (INDEPENDENT_AMBULATORY_CARE_PROVIDER_SITE_OTHER): Admitting: Family Medicine

## 2024-03-15 VITALS — BP 122/74 | HR 76 | Temp 98.1°F | Ht 66.0 in | Wt 202.0 lb

## 2024-03-15 DIAGNOSIS — K5190 Ulcerative colitis, unspecified, without complications: Secondary | ICD-10-CM

## 2024-03-15 DIAGNOSIS — Z23 Encounter for immunization: Secondary | ICD-10-CM | POA: Diagnosis not present

## 2024-03-15 DIAGNOSIS — G43009 Migraine without aura, not intractable, without status migrainosus: Secondary | ICD-10-CM

## 2024-03-15 DIAGNOSIS — I10 Essential (primary) hypertension: Secondary | ICD-10-CM

## 2024-03-15 DIAGNOSIS — Z0000 Encounter for general adult medical examination without abnormal findings: Secondary | ICD-10-CM

## 2024-03-15 DIAGNOSIS — R7303 Prediabetes: Secondary | ICD-10-CM | POA: Diagnosis not present

## 2024-03-15 DIAGNOSIS — D509 Iron deficiency anemia, unspecified: Secondary | ICD-10-CM

## 2024-03-15 DIAGNOSIS — Z1329 Encounter for screening for other suspected endocrine disorder: Secondary | ICD-10-CM

## 2024-03-15 DIAGNOSIS — R5383 Other fatigue: Secondary | ICD-10-CM | POA: Diagnosis not present

## 2024-03-15 DIAGNOSIS — R300 Dysuria: Secondary | ICD-10-CM | POA: Diagnosis not present

## 2024-03-15 DIAGNOSIS — Z13220 Encounter for screening for lipoid disorders: Secondary | ICD-10-CM | POA: Diagnosis not present

## 2024-03-15 MED ORDER — LISINOPRIL 10 MG PO TABS: 10.0000 mg | ORAL_TABLET | Freq: Every day | ORAL | 3 refills | Status: AC

## 2024-03-15 MED ORDER — ROSUVASTATIN CALCIUM 10 MG PO TABS: 10.0000 mg | ORAL_TABLET | Freq: Every day | ORAL | 3 refills | Status: AC

## 2024-03-16 ENCOUNTER — Encounter: Payer: Self-pay | Admitting: Family Medicine

## 2024-03-16 DIAGNOSIS — N898 Other specified noninflammatory disorders of vagina: Secondary | ICD-10-CM

## 2024-03-16 LAB — COMPREHENSIVE METABOLIC PANEL WITH GFR
ALT: 12 U/L (ref 0–35)
AST: 15 U/L (ref 0–37)
Albumin: 4.6 g/dL (ref 3.5–5.2)
Alkaline Phosphatase: 59 U/L (ref 39–117)
BUN: 17 mg/dL (ref 6–23)
CO2: 25 meq/L (ref 19–32)
Calcium: 9.5 mg/dL (ref 8.4–10.5)
Chloride: 103 meq/L (ref 96–112)
Creatinine, Ser: 1.13 mg/dL (ref 0.40–1.20)
GFR: 51.31 mL/min — ABNORMAL LOW (ref >60.00)
Glucose, Bld: 85 mg/dL (ref 70–99)
Potassium: 4.3 meq/L (ref 3.5–5.1)
Sodium: 137 meq/L (ref 135–145)
Total Bilirubin: 0.3 mg/dL (ref 0.2–1.2)
Total Protein: 7 g/dL (ref 6.0–8.3)

## 2024-03-16 LAB — HEMOGLOBIN A1C: Hgb A1c MFr Bld: 6.3 % (ref 4.6–6.5)

## 2024-03-16 LAB — CBC
HCT: 33.3 % — ABNORMAL LOW (ref 36.0–46.0)
Hemoglobin: 10.6 g/dL — ABNORMAL LOW (ref 12.0–15.0)
MCHC: 31.9 g/dL (ref 30.0–36.0)
MCV: 77.1 fl — ABNORMAL LOW (ref 78.0–100.0)
Platelets: 350 K/uL (ref 150.0–400.0)
RBC: 4.32 Mil/uL (ref 3.87–5.11)
RDW: 18.2 % — ABNORMAL HIGH (ref 11.5–15.5)
WBC: 5.8 K/uL (ref 4.0–10.5)

## 2024-03-16 LAB — LIPID PANEL
Cholesterol: 147 mg/dL (ref 0–200)
HDL: 72 mg/dL (ref >39.00)
LDL Cholesterol: 63 mg/dL (ref 0–99)
NonHDL: 74.61
Total CHOL/HDL Ratio: 2
Triglycerides: 56 mg/dL (ref 0.0–149.0)
VLDL: 11.2 mg/dL (ref 0.0–40.0)

## 2024-03-16 LAB — VITAMIN B12: Vitamin B-12: 1291 pg/mL — ABNORMAL HIGH (ref 211–911)

## 2024-03-16 LAB — URINE CULTURE
MICRO NUMBER:: 16735753
SPECIMEN QUALITY:: ADEQUATE

## 2024-03-16 LAB — TSH: TSH: 1.15 u[IU]/mL (ref 0.35–5.50)

## 2024-03-16 MED ORDER — CEPHALEXIN 500 MG PO CAPS: 500.0000 mg | ORAL_CAPSULE | Freq: Two times a day (BID) | ORAL | 0 refills | Status: AC

## 2024-03-16 NOTE — Addendum Note (Signed)
 Addended by: WATT RAISIN C on: 03/16/2024 12:29 PM   Modules accepted: Orders

## 2024-03-27 NOTE — Addendum Note (Signed)
 Addended by: WATT RAISIN C on: 03/27/2024 08:44 PM   Modules accepted: Orders

## 2024-04-17 DIAGNOSIS — Z1330 Encounter for screening examination for mental health and behavioral disorders, unspecified: Secondary | ICD-10-CM | POA: Diagnosis not present

## 2024-04-17 DIAGNOSIS — G43009 Migraine without aura, not intractable, without status migrainosus: Secondary | ICD-10-CM | POA: Diagnosis not present

## 2024-05-02 ENCOUNTER — Other Ambulatory Visit: Payer: Self-pay | Admitting: Family Medicine

## 2024-05-02 DIAGNOSIS — N3941 Urge incontinence: Secondary | ICD-10-CM

## 2024-07-07 DIAGNOSIS — M13131 Monoarthritis, not elsewhere classified, right wrist: Secondary | ICD-10-CM | POA: Diagnosis not present

## 2024-07-24 DIAGNOSIS — Z4789 Encounter for other orthopedic aftercare: Secondary | ICD-10-CM | POA: Diagnosis not present

## 2024-07-27 ENCOUNTER — Encounter: Payer: Self-pay | Admitting: Family Medicine

## 2024-08-07 DIAGNOSIS — M25641 Stiffness of right hand, not elsewhere classified: Secondary | ICD-10-CM | POA: Diagnosis not present

## 2024-08-07 DIAGNOSIS — M1811 Unilateral primary osteoarthritis of first carpometacarpal joint, right hand: Secondary | ICD-10-CM | POA: Diagnosis not present

## 2024-08-07 DIAGNOSIS — Z4789 Encounter for other orthopedic aftercare: Secondary | ICD-10-CM | POA: Diagnosis not present

## 2024-08-22 DIAGNOSIS — M25641 Stiffness of right hand, not elsewhere classified: Secondary | ICD-10-CM | POA: Diagnosis not present
